# Patient Record
Sex: Female | Born: 1950 | Race: White | Hispanic: No | Smoking: Never smoker
Health system: Southern US, Community
[De-identification: ages and names within clinical notes are randomized; demographics above are authoritative.]

## PROBLEM LIST (undated history)

## (undated) DIAGNOSIS — E079 Disorder of thyroid, unspecified: Secondary | ICD-10-CM

## (undated) DIAGNOSIS — A071 Giardiasis [lambliasis]: Secondary | ICD-10-CM

## (undated) DIAGNOSIS — I10 Essential (primary) hypertension: Secondary | ICD-10-CM

## (undated) DIAGNOSIS — K219 Gastro-esophageal reflux disease without esophagitis: Secondary | ICD-10-CM

## (undated) DIAGNOSIS — I341 Nonrheumatic mitral (valve) prolapse: Secondary | ICD-10-CM

## (undated) DIAGNOSIS — IMO0002 Reserved for concepts with insufficient information to code with codable children: Secondary | ICD-10-CM

## (undated) DIAGNOSIS — K589 Irritable bowel syndrome without diarrhea: Secondary | ICD-10-CM

## (undated) DIAGNOSIS — E669 Obesity, unspecified: Secondary | ICD-10-CM

## (undated) DIAGNOSIS — J849 Interstitial pulmonary disease, unspecified: Secondary | ICD-10-CM

## (undated) DIAGNOSIS — E119 Type 2 diabetes mellitus without complications: Secondary | ICD-10-CM

## (undated) DIAGNOSIS — M329 Systemic lupus erythematosus, unspecified: Secondary | ICD-10-CM

## (undated) DIAGNOSIS — F419 Anxiety disorder, unspecified: Secondary | ICD-10-CM

## (undated) DIAGNOSIS — E785 Hyperlipidemia, unspecified: Secondary | ICD-10-CM

## (undated) DIAGNOSIS — M1990 Unspecified osteoarthritis, unspecified site: Secondary | ICD-10-CM

## (undated) HISTORY — DX: Unspecified osteoarthritis, unspecified site: M19.90

## (undated) HISTORY — PX: ABDOMINAL HYSTERECTOMY: SHX81

## (undated) HISTORY — PX: CHOLECYSTECTOMY: SHX55

## (undated) HISTORY — DX: Anxiety disorder, unspecified: F41.9

## (undated) HISTORY — DX: Hyperlipidemia, unspecified: E78.5

## (undated) HISTORY — DX: Type 2 diabetes mellitus without complications: E11.9

## (undated) HISTORY — DX: Essential (primary) hypertension: I10

## (undated) HISTORY — DX: Nonrheumatic mitral (valve) prolapse: I34.1

## (undated) HISTORY — DX: Gastro-esophageal reflux disease without esophagitis: K21.9

## (undated) HISTORY — PX: TONSILLECTOMY AND ADENOIDECTOMY: SUR1326

## (undated) HISTORY — PX: CYSTECTOMY: SUR359

## (undated) HISTORY — DX: Irritable bowel syndrome, unspecified: K58.9

## (undated) HISTORY — PX: REPLACEMENT TOTAL KNEE BILATERAL: SUR1225

## (undated) HISTORY — DX: Obesity, unspecified: E66.9

## (undated) HISTORY — PX: REVISION TOTAL KNEE ARTHROPLASTY: SHX767

## (undated) HISTORY — DX: Giardiasis (lambliasis): A07.1

## (undated) HISTORY — DX: Disorder of thyroid, unspecified: E07.9

---

## 2001-05-29 ENCOUNTER — Encounter: Admission: RE | Admit: 2001-05-29 | Discharge: 2001-05-29 | Payer: Self-pay | Admitting: Neurosurgery

## 2003-09-05 ENCOUNTER — Ambulatory Visit (HOSPITAL_COMMUNITY): Admission: RE | Admit: 2003-09-05 | Discharge: 2003-09-05 | Payer: Self-pay | Admitting: Internal Medicine

## 2004-05-17 ENCOUNTER — Encounter: Admission: RE | Admit: 2004-05-17 | Discharge: 2004-05-17 | Payer: Self-pay | Admitting: Internal Medicine

## 2005-08-10 ENCOUNTER — Emergency Department (HOSPITAL_COMMUNITY): Admission: EM | Admit: 2005-08-10 | Discharge: 2005-08-10 | Payer: Self-pay | Admitting: Emergency Medicine

## 2007-12-02 ENCOUNTER — Encounter: Admission: RE | Admit: 2007-12-02 | Discharge: 2007-12-02 | Payer: Self-pay | Admitting: Internal Medicine

## 2009-05-07 ENCOUNTER — Emergency Department: Payer: Self-pay | Admitting: Emergency Medicine

## 2012-10-20 ENCOUNTER — Ambulatory Visit (INDEPENDENT_AMBULATORY_CARE_PROVIDER_SITE_OTHER): Payer: BC Managed Care – PPO | Admitting: Internal Medicine

## 2012-10-20 ENCOUNTER — Encounter: Payer: Self-pay | Admitting: Internal Medicine

## 2012-10-20 VITALS — BP 120/70 | HR 69 | Temp 97.7°F | Resp 18 | Ht 63.0 in | Wt 245.0 lb

## 2012-10-20 DIAGNOSIS — M1990 Unspecified osteoarthritis, unspecified site: Secondary | ICD-10-CM

## 2012-10-20 DIAGNOSIS — Z8679 Personal history of other diseases of the circulatory system: Secondary | ICD-10-CM | POA: Insufficient documentation

## 2012-10-20 DIAGNOSIS — Z90710 Acquired absence of both cervix and uterus: Secondary | ICD-10-CM

## 2012-10-20 DIAGNOSIS — E119 Type 2 diabetes mellitus without complications: Secondary | ICD-10-CM | POA: Insufficient documentation

## 2012-10-20 DIAGNOSIS — R197 Diarrhea, unspecified: Secondary | ICD-10-CM

## 2012-10-20 DIAGNOSIS — I10 Essential (primary) hypertension: Secondary | ICD-10-CM

## 2012-10-20 DIAGNOSIS — E785 Hyperlipidemia, unspecified: Secondary | ICD-10-CM

## 2012-10-20 DIAGNOSIS — K529 Noninfective gastroenteritis and colitis, unspecified: Secondary | ICD-10-CM | POA: Insufficient documentation

## 2012-10-20 DIAGNOSIS — IMO0001 Reserved for inherently not codable concepts without codable children: Secondary | ICD-10-CM

## 2012-10-20 DIAGNOSIS — Z8619 Personal history of other infectious and parasitic diseases: Secondary | ICD-10-CM

## 2012-10-20 DIAGNOSIS — K589 Irritable bowel syndrome without diarrhea: Secondary | ICD-10-CM | POA: Insufficient documentation

## 2012-10-20 DIAGNOSIS — Z139 Encounter for screening, unspecified: Secondary | ICD-10-CM

## 2012-10-20 DIAGNOSIS — Z8659 Personal history of other mental and behavioral disorders: Secondary | ICD-10-CM

## 2012-10-20 DIAGNOSIS — E039 Hypothyroidism, unspecified: Secondary | ICD-10-CM

## 2012-10-20 LAB — COMPREHENSIVE METABOLIC PANEL
ALT: 15 U/L (ref 0–35)
AST: 11 U/L (ref 0–37)
Albumin: 4.2 g/dL (ref 3.5–5.2)
Alkaline Phosphatase: 80 U/L (ref 39–117)
BUN: 15 mg/dL (ref 6–23)
CO2: 28 mEq/L (ref 19–32)
Calcium: 9.5 mg/dL (ref 8.4–10.5)
Chloride: 103 mEq/L (ref 96–112)
Creat: 0.95 mg/dL (ref 0.50–1.10)
Glucose, Bld: 91 mg/dL (ref 70–99)
Potassium: 4.3 mEq/L (ref 3.5–5.3)
Sodium: 142 mEq/L (ref 135–145)
Total Bilirubin: 0.4 mg/dL (ref 0.3–1.2)
Total Protein: 6.2 g/dL (ref 6.0–8.3)

## 2012-10-20 LAB — LIPID PANEL
Cholesterol: 287 mg/dL — ABNORMAL HIGH (ref 0–200)
HDL: 49 mg/dL (ref >39)
LDL Cholesterol: 202 mg/dL — ABNORMAL HIGH (ref 0–99)
Total CHOL/HDL Ratio: 5.9 Ratio
Triglycerides: 178 mg/dL — ABNORMAL HIGH (ref <150)
VLDL: 36 mg/dL (ref 0–40)

## 2012-10-20 MED ORDER — FUROSEMIDE 20 MG PO TABS: 20.0000 mg | ORAL_TABLET | Freq: Every day | ORAL | Status: DC

## 2012-10-20 MED ORDER — QUINAPRIL-HYDROCHLOROTHIAZIDE 20-25 MG PO TABS: 1.0000 | ORAL_TABLET | Freq: Every day | ORAL | Status: DC

## 2012-10-20 NOTE — Progress Notes (Signed)
Subjective:    Patient ID: Dana Doyle, female    DOB: June 06, 1950, 62 y.o.   MRN: [id number]  HPI Dana Doyle is a new pt here for first visit.  [facility].  PMH of long standing HTN,  Diabetes (1 year),  Anxiety,  DJD of knees,  Hyperlipidemia, MVP Hypothyroidism, and remote abnormal pap in 1980's with subsequent hysterectomy.   She also has irritiaable bowel syndrom and chronic diarrhea.    She had an episode of chest pain last week  That she states was different from the pain she has with her MVP. 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  She is adopted and does not know her FH.  No further episodes in the last 7 days  She also has chornic diarrhea and is concerned that her stools are yellow in color.  She as not seen her GI MD in quite a while as she lost her job in 2009 and recently obtained insurance via the [employer]   Allergies  Allergen Reactions  . Phenergan (Promethazine Hcl) Anaphylaxis   Past Medical History  Diagnosis Date  . Thyroid disease   . Giardia   . Anxiety   . Arthritis   . Diabetes mellitus without complication   . Hypertension   . Hyperlipidemia    Past Surgical History  Procedure Laterality Date  . Abdominal hysterectomy    . Cholecystectomy    . Tonsillectomy and adenoidectomy     History   Social History  . Marital Status: Married    Spouse Name: N/A    Number of Children: N/A  . Years of Education: N/A   Occupational History  . Not on file.   Social History Main Topics  . Smoking status: Never Smoker   . Smokeless tobacco: Never Used  . Alcohol Use: No  . Drug Use: No  . Sexually Active: No   Other Topics Concern  . Not on file   Social History Narrative  . No narrative on file   Family History  Problem Relation Age of Onset  . Family history unknown: Yes   Patient Active Problem List   Diagnosis Date Noted  . DJD (degenerative joint disease) 10/20/2012  . HTN (hypertension)  10/20/2012  . S/P hysterectomy 10/20/2012  . History of anxiety 10/20/2012  . Type II or unspecified type diabetes mellitus without mention of complication, uncontrolled 10/20/2012  . History of mitral valve prolapse 10/20/2012  . Other and unspecified hyperlipidemia 10/20/2012  . Unspecified hypothyroidism 10/20/2012  . Irritable bowel syndrome 10/20/2012  . Chronic diarrhea 10/20/2012  . History of giardia infection 10/20/2012   No current outpatient prescriptions on file prior to visit.   No current facility-administered medications on file prior to visit.       Review of Systems    see HPI Objective:   Physical Exam Physical Exam  Nursing note and vitals reviewed.  Constitutional: She is oriented to person, place, and time. She appears well-developed and well-nourished.  HENT:  Head: Normocephalic and atraumatic.  Cardiovascular: Normal rate and regular rhythm. Exam reveals no gallop and no friction rub.  No murmur heard.  Pulmonary/Chest: Breath sounds normal. She has no wheezes. She has no rales.  Neurological: She is alert and oriented to person, place, and time.  Skin: Skin is warm and dry.  Ext no edema Psychiatric: She has a normal mood and affect. Her behavior is normal.  Assessment & Plan:  Chest pain  EKG  Non specific ST-T changes.  Will refer to cardiolgy.  Advised if another episode to seek ER care.  She is to take a baby ASA daily until cardiology eval.    History of MVP  See above  Will need current ECHO  Diabetes  Check AIC continue metformin  Hyperlipidemia will check today  HTN continue meds  Hypothyroidism  Check labs today  Chronic diarrhea/IBS  Will refer to GI  See me in one week

## 2012-10-20 NOTE — Patient Instructions (Addendum)
See me next week 

## 2012-10-21 ENCOUNTER — Encounter: Payer: Self-pay | Admitting: Gastroenterology

## 2012-10-21 ENCOUNTER — Encounter: Payer: Self-pay | Admitting: *Deleted

## 2012-10-21 LAB — CBC WITH DIFFERENTIAL/PLATELET
Basophils Absolute: 0 10*3/uL (ref 0.0–0.1)
Basophils Relative: 0 % (ref 0–1)
Eosinophils Absolute: 0.1 10*3/uL (ref 0.0–0.7)
Eosinophils Relative: 1 % (ref 0–5)
HCT: 37.8 % (ref 36.0–46.0)
Hemoglobin: 12.9 g/dL (ref 12.0–15.0)
Lymphocytes Relative: 27 % (ref 12–46)
Lymphs Abs: 1.8 10*3/uL (ref 0.7–4.0)
MCH: 29.3 pg (ref 26.0–34.0)
MCHC: 34.1 g/dL (ref 30.0–36.0)
MCV: 85.7 fL (ref 78.0–100.0)
Monocytes Absolute: 0.5 10*3/uL (ref 0.1–1.0)
Monocytes Relative: 7 % (ref 3–12)
Neutro Abs: 4.3 10*3/uL (ref 1.7–7.7)
Neutrophils Relative %: 65 % (ref 43–77)
Platelets: 343 10*3/uL (ref 150–400)
RBC: 4.41 MIL/uL (ref 3.87–5.11)
RDW: 14.8 % (ref 11.5–15.5)
WBC: 6.7 10*3/uL (ref 4.0–10.5)

## 2012-10-21 LAB — HEMOGLOBIN A1C
Hgb A1c MFr Bld: 6 % — ABNORMAL HIGH (ref <5.7)
Mean Plasma Glucose: 126 mg/dL — ABNORMAL HIGH (ref <117)

## 2012-10-21 LAB — TSH: TSH: 1.606 u[IU]/mL (ref 0.350–4.500)

## 2012-10-21 LAB — VITAMIN D 25 HYDROXY (VIT D DEFICIENCY, FRACTURES): Vit D, 25-Hydroxy: 85 ng/mL (ref 30–89)

## 2012-10-22 ENCOUNTER — Telehealth: Payer: Self-pay

## 2012-10-22 NOTE — Telephone Encounter (Signed)
Received call from patient she stated her husband is a patient of Dr.Jordan's and she wants appointment with Dr.Jordan.Stated she has been having chest pain off and on for the past several weeks.Stated she has MVP and saw Dr.Brackbill 20 years ago.Stated has appointment with Dr.Brackbill 10/23/12 and wants to cancel appointment.Patient was told Dr.Jordan's schedule is full and I will have to check with Dr.Jordan before I add her to his schedule.Patient advised to go to ER if has chest pain.

## 2012-10-23 ENCOUNTER — Ambulatory Visit: Payer: BC Managed Care – PPO | Admitting: Cardiology

## 2012-10-26 ENCOUNTER — Telehealth: Payer: Self-pay | Admitting: *Deleted

## 2012-10-26 NOTE — Telephone Encounter (Signed)
Patient would like lisenapril instead of quinapril due to cost.

## 2012-10-27 ENCOUNTER — Other Ambulatory Visit: Payer: Self-pay | Admitting: Internal Medicine

## 2012-10-27 MED ORDER — LISINOPRIL-HYDROCHLOROTHIAZIDE 20-25 MG PO TABS: 1.0000 | ORAL_TABLET | Freq: Every day | ORAL | Status: DC

## 2012-10-28 ENCOUNTER — Ambulatory Visit (INDEPENDENT_AMBULATORY_CARE_PROVIDER_SITE_OTHER): Payer: BC Managed Care – PPO | Admitting: Internal Medicine

## 2012-10-28 ENCOUNTER — Telehealth: Payer: Self-pay | Admitting: Cardiology

## 2012-10-28 ENCOUNTER — Encounter: Payer: Self-pay | Admitting: Internal Medicine

## 2012-10-28 ENCOUNTER — Ambulatory Visit (HOSPITAL_BASED_OUTPATIENT_CLINIC_OR_DEPARTMENT_OTHER)
Admission: RE | Admit: 2012-10-28 | Discharge: 2012-10-28 | Disposition: A | Discharge status: Home / Self Care | Payer: BC Managed Care – PPO | Source: Ambulatory Visit | Attending: Internal Medicine | Admitting: Internal Medicine

## 2012-10-28 VITALS — BP 109/73 | HR 78 | Temp 98.5°F | Resp 18 | Wt 243.0 lb

## 2012-10-28 DIAGNOSIS — I10 Essential (primary) hypertension: Secondary | ICD-10-CM

## 2012-10-28 DIAGNOSIS — IMO0001 Reserved for inherently not codable concepts without codable children: Secondary | ICD-10-CM

## 2012-10-28 DIAGNOSIS — R197 Diarrhea, unspecified: Secondary | ICD-10-CM

## 2012-10-28 DIAGNOSIS — K529 Noninfective gastroenteritis and colitis, unspecified: Secondary | ICD-10-CM

## 2012-10-28 DIAGNOSIS — Z1231 Encounter for screening mammogram for malignant neoplasm of breast: Secondary | ICD-10-CM | POA: Insufficient documentation

## 2012-10-28 DIAGNOSIS — E785 Hyperlipidemia, unspecified: Secondary | ICD-10-CM

## 2012-10-28 DIAGNOSIS — Z139 Encounter for screening, unspecified: Secondary | ICD-10-CM

## 2012-10-28 MED ORDER — DIAZEPAM 2 MG PO TABS: ORAL_TABLET | ORAL | Status: DC

## 2012-10-28 NOTE — Telephone Encounter (Signed)
Returned call to patient Dr.Jordan had a cancellation on Friday 10/30/12.Appointment scheduled with Dr.Jordan 10/30/12 1:30 pm advised to check in at 1:15 pm.

## 2012-10-28 NOTE — Telephone Encounter (Signed)
Follow Up ° °Pt returning call from earlier. Please call. °

## 2012-10-28 NOTE — Patient Instructions (Addendum)
See me in 3 months

## 2012-10-28 NOTE — Progress Notes (Signed)
Subjective:    Patient ID: Dana Doyle, female    DOB: 1950-04-30, 62 y.o.   MRN: [id number]  HPI Dana Doyle is here for follow up  See labs  She has a long standing history of significant Hyperlipidemia  And tells me she is intolerant of all statins,  Zetia , Tricor and welchol.    Diabetes  AIC great.    She tells me she has an upcoming appt with Dr. [place] and Dr. Jarold Motto  I have changed her BP med to lisinopril/hctz as she could not affort Accuretic  Allergies  Allergen Reactions  . Phenergan (Promethazine Hcl) Anaphylaxis   Past Medical History  Diagnosis Date  . Thyroid disease   . Giardia   . Anxiety   . Arthritis   . Diabetes mellitus without complication   . Hypertension   . Hyperlipidemia   . IBS (irritable bowel syndrome)   . GERD (gastroesophageal reflux disease)   . MVP (mitral valve prolapse)    Past Surgical History  Procedure Laterality Date  . Abdominal hysterectomy    . Cholecystectomy    . Tonsillectomy and adenoidectomy     History   Social History  . Marital Status: Married    Spouse Name: N/A    Number of Children: N/A  . Years of Education: N/A   Occupational History  . Not on file.   Social History Main Topics  . Smoking status: Never Smoker   . Smokeless tobacco: Never Used  . Alcohol Use: No  . Drug Use: No  . Sexually Active: No   Other Topics Concern  . Not on file   Social History Narrative  . No narrative on file   History reviewed. No pertinent family history. Patient Active Problem List   Diagnosis Date Noted  . DJD (degenerative joint disease) 10/20/2012  . HTN (hypertension) 10/20/2012  . S/P hysterectomy 10/20/2012  . History of anxiety 10/20/2012  . Type II or unspecified type diabetes mellitus without mention of complication, uncontrolled 10/20/2012  . History of mitral valve prolapse 10/20/2012  . Other and unspecified hyperlipidemia 10/20/2012  . Unspecified hypothyroidism 10/20/2012  . Irritable bowel  syndrome 10/20/2012  . Chronic diarrhea 10/20/2012  . History of giardia infection 10/20/2012   Current Outpatient Prescriptions on File Prior to Visit  Medication Sig Dispense Refill  . b complex vitamins capsule Take 1 capsule by mouth daily.      . Diclofenac-Misoprostol (ARTHROTEC PO) Take 75 mg by mouth 2 (two) times daily before a meal.      . dicyclomine (BENTYL) 20 MG tablet Take 20 mg by mouth as needed.       . ergocalciferol (VITAMIN D2) 50000 UNITS capsule Take 5,000 Units by mouth daily.       . furosemide (LASIX) 20 MG tablet Take 1 tablet (20 mg total) by mouth daily.  90 tablet  0  . levothyroxine (SYNTHROID, LEVOTHROID) 50 MCG tablet Take 60 mcg by mouth daily before breakfast.      . lisinopril-hydrochlorothiazide (PRINZIDE,ZESTORETIC) 20-25 MG per tablet Take 1 tablet by mouth daily.  90 tablet  3  . metFORMIN (GLUCOPHAGE) 500 MG tablet Take 500 mg by mouth 2 (two) times daily with a meal. One half tablet daily for a total of 500mg  daily      . naproxen sodium (ANAPROX) 220 MG tablet Take 220 mg by mouth as needed.      Marland Kitchen OVER THE COUNTER MEDICATION 595 mg by Per post-pyloric tube route  daily. potassium       No current facility-administered medications on file prior to visit.       Review of Systems See HPI    Objective:   Physical Exam  Physical Exam  Nursing note and vitals reviewed.  Constitutional: She is oriented to person, place, and time. She appears well-developed and well-nourished.  HENT:  Head: Normocephalic and atraumatic.  Cardiovascular: Normal rate and regular rhythm. Exam reveals no gallop and no friction rub.  No murmur heard.  Pulmonary/Chest: Breath sounds normal. She has no wheezes. She has no rales.  Neurological: She is alert and oriented to person, place, and time.  Skin: Skin is warm and dry.  Ext  No edema Psychiatric: She has a normal mood and affect. Her behavior is normal.        Assessment & Plan:  HTN well  controlled  Hyperlipidemia pt wishes to discuss with cardiologist.    Diabetes  AIC great    Obesity  Counseled DASH diet   Hypothyrodisim  TSH normal  See me in 3-4 months

## 2012-10-30 ENCOUNTER — Ambulatory Visit (INDEPENDENT_AMBULATORY_CARE_PROVIDER_SITE_OTHER): Payer: BC Managed Care – PPO | Admitting: Cardiology

## 2012-10-30 ENCOUNTER — Encounter: Payer: Self-pay | Admitting: Cardiology

## 2012-10-30 VITALS — BP 104/60 | HR 70 | Ht 63.0 in | Wt 244.6 lb

## 2012-10-30 DIAGNOSIS — E7800 Pure hypercholesterolemia, unspecified: Secondary | ICD-10-CM

## 2012-10-30 DIAGNOSIS — I10 Essential (primary) hypertension: Secondary | ICD-10-CM

## 2012-10-30 DIAGNOSIS — R079 Chest pain, unspecified: Secondary | ICD-10-CM

## 2012-10-30 DIAGNOSIS — IMO0001 Reserved for inherently not codable concepts without codable children: Secondary | ICD-10-CM

## 2012-10-30 DIAGNOSIS — Z8679 Personal history of other diseases of the circulatory system: Secondary | ICD-10-CM

## 2012-10-30 NOTE — Patient Instructions (Signed)
We will schedule you for a nuclear stress test and an Echocardiogram  Continue your current therapy for now.

## 2012-10-30 NOTE — Progress Notes (Signed)
Dana Doyle Date of Birth: 1950-09-01 Medical Record #[id number]  History of Present Illness: Dana Doyle is seen at the request of Dr. Constance Goltz for evaluation of chest pain. She is a pleasant 62 year old white female who has a history of marked hypercholesterolemia, hypertension, and diabetes. She reports that she was diagnosed with angina many years ago. She was also diagnosed with mitral valve prolapse at that time. Her last evaluation was about 10 years ago when she had an echocardiogram and stress test. She reports she had a heart catheterization 20 years ago which demonstrated no significant coronary disease. Over the past 6 months she has experienced increased symptoms of chest pain. She describes this as left anterior precordial pain radiating to her back. It may be associated with shortness of breath. She finds it difficult to lie on her left side. She describes it both as a pressure and pain. It may last as little as 10 minutes or as long as 2 hours. Typically she will just rest and take aspirin until he goes away. She feels her symptoms are more frequent now on her current once or twice a week. Her symptoms are worse with activity or with stress. She is limited in her exercise because of arthritis in her knees.  Current Outpatient Prescriptions on File Prior to Visit  Medication Sig Dispense Refill  . b complex vitamins capsule Take 1 capsule by mouth daily.      . diazepam (VALIUM) 2 MG tablet Take one at [facility] prn  12 tablet  0  . Diclofenac-Misoprostol (ARTHROTEC PO) Take 75 mg by mouth 2 (two) times daily before a meal.      . dicyclomine (BENTYL) 20 MG tablet Take 20 mg by mouth as needed.       . ergocalciferol (VITAMIN D2) 50000 UNITS capsule Take 5,000 Units by mouth daily.       . furosemide (LASIX) 20 MG tablet Take 1 tablet (20 mg total) by mouth daily.  90 tablet  0  . levothyroxine (SYNTHROID, LEVOTHROID) 50 MCG tablet Take 60 mcg by mouth daily before breakfast.      .  lisinopril-hydrochlorothiazide (PRINZIDE,ZESTORETIC) 20-25 MG per tablet Take 1 tablet by mouth daily.  90 tablet  3  . metFORMIN (GLUCOPHAGE) 500 MG tablet Take 500 mg by mouth 2 (two) times daily with a meal. One half tablet daily for a total of 500mg  daily      . naproxen sodium (ANAPROX) 220 MG tablet Take 220 mg by mouth as needed.      Marland Kitchen OVER THE COUNTER MEDICATION 595 mg by Per post-pyloric tube route daily. potassium       No current facility-administered medications on file prior to visit.    Allergies  Allergen Reactions  . Phenergan (Promethazine Hcl) Anaphylaxis    Past Medical History  Diagnosis Date  . Thyroid disease   . Giardia   . Anxiety   . Arthritis   . Diabetes mellitus without complication   . Hypertension   . Hyperlipidemia   . IBS (irritable bowel syndrome)   . GERD (gastroesophageal reflux disease)   . MVP (mitral valve prolapse)     Past Surgical History  Procedure Laterality Date  . Abdominal hysterectomy    . Cholecystectomy    . Tonsillectomy and adenoidectomy      History  Smoking status  . Never Smoker   Smokeless tobacco  . Never Used    History  Alcohol Use No    History  reviewed. No pertinent family history.  Review of Systems: The review of systems is positive for obesity.  All other systems were reviewed and are negative.  Physical Exam: BP 104/60  Pulse 70  Ht 5\' 3"  (1.6 m)  Wt 244 lb 9.6 oz (110.95 kg)  BMI 43.34 kg/m2 She is an obese white female in no acute distress. HEENT: Normocephalic, atraumatic. It was equal round and reactive to light accommodation. Extraocular movements are full. Oropharynx is clear. Neck is supple no JVD, adenopathy, thyromegaly, or bruits. Lungs: Clear Cardiovascular: Regular rate and rhythm. No gallop, murmur, or click. PMI is normal. Abdomen: Morbidly obese, soft, nontender. No basilar megaly or masses. Bowel sounds are positive. Extremities: No cyanosis or edema. Pulses are 2+ and  symmetric. Neuro: Alert and oriented x3. Cranial nerves II through XII are intact. Skin: Warm and dry.  LABORATORY DATA: Lab Results  Component Value Date   WBC 6.7 10/20/2012   HGB 12.9 10/20/2012   HCT 37.8 10/20/2012   PLT 343 10/20/2012   GLUCOSE 91 10/20/2012   CHOL 287* 10/20/2012   TRIG 178* 10/20/2012   HDL 49 10/20/2012   LDLCALC 202* 10/20/2012   ALT 15 10/20/2012   AST 11 10/20/2012   NA 142 10/20/2012   K 4.3 10/20/2012   CL 103 10/20/2012   CREATININE 0.95 10/20/2012   BUN 15 10/20/2012   CO2 28 10/20/2012   TSH 1.606 10/20/2012   HGBA1C 6.0* 10/20/2012   ECG demonstrates normal sinus rhythm with a rate of 71 beats per minute. It is otherwise normal.  Assessment / Plan: 1. Chest pain. Patient does have multiple cardiac risk factors including diabetes, hypertension, and hyperlipidemia. We will schedule her for a nuclear stress test. Her symptoms could also be related to mitral valve prolapse. Her exam today is fairly benign. Have recommended an echocardiogram. Depending on the results of the studies we may consider treating her with a beta blocker but we would probably need to reduce her other antihypertensive therapy. 2. Severe hypercholesterolemia. Patient has been intolerant even low-dose statin therapy. She also has history of intolerance to Zetia, [employer], and [employer]. She needs to focus on weight loss and dietary modification. 3. Diabetes mellitus type 2. 4. Hypertension-controlled. 5. Morbid obesity.

## 2012-11-03 ENCOUNTER — Ambulatory Visit: Payer: Self-pay | Admitting: Internal Medicine

## 2012-11-03 ENCOUNTER — Encounter: Payer: Self-pay | Admitting: Gastroenterology

## 2012-11-03 ENCOUNTER — Ambulatory Visit (INDEPENDENT_AMBULATORY_CARE_PROVIDER_SITE_OTHER): Payer: BC Managed Care – PPO | Admitting: Gastroenterology

## 2012-11-03 VITALS — BP 124/72 | HR 83 | Ht 63.0 in | Wt 244.0 lb

## 2012-11-03 DIAGNOSIS — R197 Diarrhea, unspecified: Secondary | ICD-10-CM

## 2012-11-03 DIAGNOSIS — R1011 Right upper quadrant pain: Secondary | ICD-10-CM

## 2012-11-03 DIAGNOSIS — K589 Irritable bowel syndrome without diarrhea: Secondary | ICD-10-CM

## 2012-11-03 DIAGNOSIS — Z9049 Acquired absence of other specified parts of digestive tract: Secondary | ICD-10-CM

## 2012-11-03 DIAGNOSIS — Z9089 Acquired absence of other organs: Secondary | ICD-10-CM

## 2012-11-03 MED ORDER — MOVIPREP 100 G PO SOLR: 1.0000 | Freq: Once | ORAL | Status: DC

## 2012-11-03 MED ORDER — COLESEVELAM HCL 625 MG PO TABS: 625.0000 mg | ORAL_TABLET | Freq: Every day | ORAL | Status: DC

## 2012-11-03 NOTE — Progress Notes (Signed)
History of Present Illness:  This is a 62 year old Caucasian female who has had multiple GI evaluations over the last 20 years.  She has IBS with diarrhea predominance, and is really done well over the last several years ,but now has had exacerbation of her chronic diarrhea with loose watery" yellow stools".  She complains of some right flank discomfort but no upper GI or hepatobiliary symptoms.  She also denies melena, hematochezia, anorexia or weight loss.  There is no specific trigger to her diarrhea except stress.  She does complain of some urgency and occasional, almost daily nocturnal diarrhea.  Patient has degenerative arthritis in her knees and is on several when necessary NSAIDs.  Also has a long history of thyroid disorder, adult onset diabetes treated with low-dose metformin 250 mg twice a day, and takes when necessary Valium for stress.  Patient also has apparently been seen by another GI physician and placed on when necessary Benadryl 20 mg which does help her abdominal cramping.  Previous evaluations have shown no evidence of celiac disease.  She is status post cholecystectomy by Dr. Daphine Deutscher in 1990.  Last colonoscopy was over 10 years ago.  Past medical history is remarkable for mitral valve prolapse, previous acid reflux, and a vague history of treated Giardia in the past although repeat stool exams have been negative.  Review of her labs shows a negative small bowel,  duodenal, and colon biopsies in 2004.  Patient denies lactose intolerance or use of sorbitol or fructose.  She is status post previous hysterectomy and has had chronic obesity for many years.  I have reviewed this patient's present history, medical and surgical past history, allergies and medications.     ROS:   All systems were reviewed and are negative unless otherwise stated in the HPI.    Physical Exam: Blood pressure 124/72, pulse 83 and regular and weight 244 with a BMI of 43.23.  Morbidly obese but no acute  distress General well developed well nourished patient in no acute distress, appearing their stated age Eyes PERRLA, no icterus, fundoscopic exam per opthamologist Skin no lesions noted Neck supple, no adenopathy, no thyroid enlargement, no tenderness Chest clear to percussion and auscultation Heart no significant murmurs, gallops or rubs noted Abdomen no hepatosplenomegaly masses or tenderness, BS normal.  Rectal inspection normal no fissures, or fistulae noted.  No masses or tenderness on digital exam. Stool guaiac negative. Extremities no acute joint lesions, edema, phlebitis or evidence of cellulitis. Neurologic patient oriented x 3, cranial nerves intact, no focal neurologic deficits noted. Psychological mental status normal and normal affect.  Assessment and plan:   IBS diarrhea exacerbated by cholecystectomy.  I placed her on WelChol 650 mg a day with a FOD-MAP diet for IBS diarrhea.  I do think she needs followup colonoscopy which is been scheduled.  Have asked her to try to use NSAIDs as sparingly as possible.  She is otherwise continue medications as listed and reviewed.

## 2012-11-03 NOTE — Patient Instructions (Addendum)
  You have been scheduled for a colonoscopy with propofol. Please follow written instructions given to you at your visit today.  Please pick up your prep kit at the pharmacy within the next 1-3 days. If you use inhalers (even only as needed), please bring them with you on the day of your procedure. Your physician has requested that you go to www.startemmi.com and enter the access code given to you at your visit today. This web site gives a general overview about your procedure. However, you should still follow specific instructions given to you by our office regarding your preparation for the procedure.  We have sent the following medications to your pharmacy for you to pick up at your convenience: Welchol 625 mg, please take one tablet by mouth once daily  FOD MAP diet given today for your review ________________________________________________                                               We are excited to introduce MyChart, a new best-in-class service that provides you online access to important information in your electronic medical record. We want to make it easier for you to view your health information - all in one secure location - when and where you need it. We expect MyChart will enhance the quality of care and service we provide.  When you register for MyChart, you can:    View your test results.    Request appointments and receive appointment reminders via email.    Request medication renewals.    View your medical history, allergies, medications and immunizations.    Communicate with your physician's office through a password-protected site.    Conveniently print information such as your medication lists.  To find out if MyChart is right for you, please talk to a member of our clinical staff today. We will gladly answer your questions about this free health and wellness tool.  If you are age 57 or older and want a member of your family to have access to your record, you  must provide written consent by completing a proxy form available at our office. Please speak to our clinical staff about guidelines regarding accounts for patients younger than age 76.  As you activate your MyChart account and need any technical assistance, please call the MyChart technical support line at [phone]-CHART [phone]) or email your question to [email or web address]. If you email your question(s), please include your name, a return phone number and the best time to reach you.  If you have non-urgent health-related questions, you can send a message to our office through MyChart at [email or web address]. If you have a medical emergency, call 911.  Thank you for using MyChart as your new health and wellness resource!   MyChart licensed from [employer],  4540-9811. Patents Pending.

## 2012-11-04 ENCOUNTER — Encounter: Payer: Self-pay | Admitting: Gastroenterology

## 2012-11-05 ENCOUNTER — Telehealth: Payer: Self-pay | Admitting: *Deleted

## 2012-11-05 MED ORDER — CHOLESTYRAMINE 4 G PO PACK: 1.0000 | PACK | Freq: Every day | ORAL | Status: DC

## 2012-11-05 NOTE — Telephone Encounter (Signed)
Via fax from [facility], patient says that Dana Doyle is to expensive and needs something else  I asked Dr. Jarold Motto what else we could give the patient and per Dr. Jarold Motto patient can try Questran 4 grams in juice once daily.  Questran sent

## 2012-11-09 ENCOUNTER — Telehealth: Payer: Self-pay | Admitting: *Deleted

## 2012-11-09 NOTE — Telephone Encounter (Signed)
Patient cannot afford Questran or Welchol, is there anything else she can use, prescription or over the counter?

## 2012-11-09 NOTE — Telephone Encounter (Signed)
Patient called back and said the pharmacy was confusing her about insurance not covering Moviprep I advised patient that I have a Moviprep kit sample that she can pick up so she will not have to worry about insurance and pharmacy Patient advised that she will be in town tomorrow and will pick up kit then  Patient also said she cannot afford the Questran either I advised patient that I will send Dr. Jarold Motto a message that she cannot afford Questran and Welchol and once he gives me a response I will call her back.  Patient verbalized understanding

## 2012-11-09 NOTE — Telephone Encounter (Signed)
Patient left message with Janelle that Moviprep was not sent to her pharmacy and Welchol is to expensive.  Moviprep was sent because pharmacy sent letter for prior auth to be done for Moviprep last week and I called [facility] back last week to advise we do not do prior auth for Moviprep. I advised pharmacy patient can contact the office for a coupon.  Also I sent Questran per Dr. Bernette Mayers note) because while I was on phone with pharmacy about Moviprep pharmacist stated patient cannot afford Welchol.  I left message on patients voicemail for a call back to the office.

## 2012-11-09 NOTE — Telephone Encounter (Signed)
I called patient back and advised that per Dr. Jarold Motto there is no other medication that she can use for her diarrhea I advised patient to follow the FOD MAP diet given to her and if that does not help the diarrhea try Imodium  Advised patient if Imodium and diet does not work call the office back  Patient verbalized understanding

## 2012-11-09 NOTE — Telephone Encounter (Signed)
no

## 2012-11-10 ENCOUNTER — Ambulatory Visit (HOSPITAL_COMMUNITY): Payer: BC Managed Care – PPO | Attending: Cardiology | Admitting: Radiology

## 2012-11-10 ENCOUNTER — Encounter (HOSPITAL_COMMUNITY): Payer: Self-pay

## 2012-11-10 VITALS — BP 85/48 | HR 70 | Ht 63.0 in | Wt 240.0 lb

## 2012-11-10 DIAGNOSIS — R0602 Shortness of breath: Secondary | ICD-10-CM

## 2012-11-10 DIAGNOSIS — IMO0001 Reserved for inherently not codable concepts without codable children: Secondary | ICD-10-CM

## 2012-11-10 DIAGNOSIS — I059 Rheumatic mitral valve disease, unspecified: Secondary | ICD-10-CM | POA: Insufficient documentation

## 2012-11-10 DIAGNOSIS — R079 Chest pain, unspecified: Secondary | ICD-10-CM | POA: Insufficient documentation

## 2012-11-10 DIAGNOSIS — I10 Essential (primary) hypertension: Secondary | ICD-10-CM | POA: Insufficient documentation

## 2012-11-10 DIAGNOSIS — Z8679 Personal history of other diseases of the circulatory system: Secondary | ICD-10-CM

## 2012-11-10 DIAGNOSIS — E7800 Pure hypercholesterolemia, unspecified: Secondary | ICD-10-CM | POA: Insufficient documentation

## 2012-11-10 DIAGNOSIS — E119 Type 2 diabetes mellitus without complications: Secondary | ICD-10-CM | POA: Insufficient documentation

## 2012-11-10 MED ORDER — REGADENOSON 0.4 MG/5ML IV SOLN
0.4000 mg | Freq: Once | INTRAVENOUS | Status: AC
  Administered 2012-11-10: 0.4 mg via INTRAVENOUS

## 2012-11-10 MED ORDER — TECHNETIUM TC 99M SESTAMIBI GENERIC - CARDIOLITE
33.0000 | Freq: Once | INTRAVENOUS | Status: AC | PRN
  Administered 2012-11-10: 33 via INTRAVENOUS

## 2012-11-10 NOTE — Progress Notes (Signed)
[facility] SITE 3 NUCLEAR MED [address] [place], [place] [address] [phone]    Cardiology Nuclear Med Study  Dana Doyle is a 62 y.o. female     MRN : [id number]     DOB: 08-20-1950  Procedure Date: 11/10/2012  Nuclear Med Background Indication for Stress Test:  Evaluation for Ischemia History:  Asthma, Abnormal EKG, MVP, 10 years ago GXT: ok per patient, 20 years ago Cath: no significant CAD Cardiac Risk Factors: Hypertension, Lipids and NIDDM  Symptoms: Chest pain,Tightness, Pressure with/without exertion (last occurrence continuous x 2 months per patient, varies in intensity), Diaphoresis, DOE, Fatigue, Fatigue with Exertion, Nausea, Palpitations, Rapid HR and SOB   Nuclear Pre-Procedure Caffeine/Decaff Intake:  9:00pm NPO After: 8:30am   Lungs:  clear O2 Sat: 96% on room air. IV 0.9% NS with Angio Cath:  22g  IV Site: L Hand  IV Started by:  Cathlyn Parsons, RN  Chest Size (in):  40 Cup Size: D  Height: 5\' 3"  (1.6 m)  Weight:  240 lb (108.863 kg)  BMI:  Body mass index is 42.52 kg/(m^2). Tech Comments: Took Prinizide, and metformin this am.The patient has a baseline BP 80/60,patient denies dizziness. Patient complains of chronic chest tightness 1-2/10.   IV 0.9% NACl hung open prior to lexiscan  Discussed with [name], NP with an ok to do lexiscan, patient to keep record of BP's and call readings to Dr. Swaziland on 11/12/12.Irean Hong, RN.    Nuclear Med Study 1 or 2 day study: 2 day  Stress Test Type:  Lexiscan  Reading MD: Cassell Clement, MD  Order Authorizing Provider:  Vonna Drafts  Resting Radionuclide: Technetium 46m Sestamibi  Resting Radionuclide Dose: 33.0 mCi  11/12/12  Stress Radionuclide:  Technetium 63m Sestamibi  Stress Radionuclide Dose: 33.0 mCi   11/10/12          Stress Protocol Rest HR: 70 Stress HR: 100  Rest BP: 85/48 Stress BP: 102/62  Exercise Time (min): n/a METS: n/a   Predicted Max HR: 160 bpm % Max HR:  62.5 bpm Rate Pressure Product: 21308   Dose of Adenosine (mg):  n/a Dose of Lexiscan: 0.4 mg  Dose of Atropine (mg): n/a Dose of Dobutamine: n/a mcg/kg/min (at max HR)  Stress Test Technologist: Irean Hong, RN  Nuclear Technologist:  Domenic Polite, CNMT     Rest Procedure:  Myocardial perfusion imaging was performed at rest 45 minutes following the intravenous administration of Technetium 87m Sestamibi. Rest ECG: NSR - Normal EKG  Stress Procedure:  The patient received IV Lexiscan 0.4 mg over 15-seconds.  Technetium 18m Sestamibi injected at 30-seconds  The patient had a baseline BP 80/60. The patient complained of increase chest tightness to 7/10, and dizziness with Lexiscan. The patient received a total of 750 ml of 0.9% NACL.  Quantitative spect images were obtained after a 45 minute delay. Stress ECG: No significant change from baseline ECG  QPS Raw Data Images:  There is a breast shadow that accounts for the anterior attenuation. Stress Images:  There is a small area of mild anterior apical attenuation with normal uptake in the other regions.   Rest Images:  There is a small area of mild anterior apical attenuation with normal uptake in the other regions.  Subtraction (SDS):  No evidence of ischemia. Transient Ischemic Dilatation (Normal <1.22):  n/a Lung/Heart Ratio (Normal <0.45):  0.47  Quantitative Gated Spect Images QGS EDV:  54 ml QGS ESV:  6 ml  Impression Exercise Capacity:  Lexiscan with no exercise. BP Response:  Normal blood pressure response. Clinical Symptoms:  No significant symptoms noted. ECG Impression:  No significant ST segment change suggestive of ischemia. Comparison with Prior Nuclear Study: No previous nuclear study performed  Overall Impression:  Low risk stress nuclear study There is mild anterior apical attenuation that appears to be due to breast artifact.  .  LV Ejection Fraction: 90%.  LV Wall Motion:  NL LV Function; NL Wall  Motion.   Vesta Mixer, Montez Hageman., MD, [facility] 11/12/2012, 5:43 PM Office - [phone] Pager [phone]

## 2012-11-10 NOTE — Progress Notes (Signed)
Patient ID: Dana Doyle, female   DOB: 1951-01-01, 62 y.o.   MRN: [id number] Dr. Swaziland, The patient was here for a Lexiscan cardiolite today. Her baseline BP with automatic Cuff was 73/51, manual Cuff 80/60. The patient took Prinizide, and metformin this am. She held lasix this am.The patient denies dizziness, but complained of chronic chest tightness x 2 months, 1-2/10 at present. IV 0.9% NACl hung open prior to lexiscan.   Discussed with [name], NP with an ok to do lexiscan. The BP 100/70 at discharge. The patient to keep record of BP's and call readings to Dr. Swaziland on 11-12-12. To go to ED if has a change in symptoms. Irean Hong, RN.

## 2012-11-12 ENCOUNTER — Ambulatory Visit (HOSPITAL_COMMUNITY): Payer: BC Managed Care – PPO | Attending: Cardiology

## 2012-11-12 ENCOUNTER — Ambulatory Visit (HOSPITAL_BASED_OUTPATIENT_CLINIC_OR_DEPARTMENT_OTHER): Payer: BC Managed Care – PPO

## 2012-11-12 DIAGNOSIS — R0789 Other chest pain: Secondary | ICD-10-CM | POA: Insufficient documentation

## 2012-11-12 DIAGNOSIS — E119 Type 2 diabetes mellitus without complications: Secondary | ICD-10-CM | POA: Insufficient documentation

## 2012-11-12 DIAGNOSIS — I059 Rheumatic mitral valve disease, unspecified: Secondary | ICD-10-CM | POA: Insufficient documentation

## 2012-11-12 DIAGNOSIS — E785 Hyperlipidemia, unspecified: Secondary | ICD-10-CM | POA: Insufficient documentation

## 2012-11-12 DIAGNOSIS — Z8679 Personal history of other diseases of the circulatory system: Secondary | ICD-10-CM

## 2012-11-12 DIAGNOSIS — I10 Essential (primary) hypertension: Secondary | ICD-10-CM | POA: Insufficient documentation

## 2012-11-12 DIAGNOSIS — R079 Chest pain, unspecified: Secondary | ICD-10-CM

## 2012-11-12 DIAGNOSIS — E7800 Pure hypercholesterolemia, unspecified: Secondary | ICD-10-CM

## 2012-11-12 DIAGNOSIS — IMO0001 Reserved for inherently not codable concepts without codable children: Secondary | ICD-10-CM

## 2012-11-12 DIAGNOSIS — R0989 Other specified symptoms and signs involving the circulatory and respiratory systems: Secondary | ICD-10-CM

## 2012-11-12 DIAGNOSIS — R072 Precordial pain: Secondary | ICD-10-CM | POA: Insufficient documentation

## 2012-11-12 MED ORDER — TECHNETIUM TC 99M SESTAMIBI GENERIC - CARDIOLITE
33.0000 | Freq: Once | INTRAVENOUS | Status: AC | PRN
  Administered 2012-11-12: 33 via INTRAVENOUS

## 2012-11-12 NOTE — Progress Notes (Signed)
Echocardiogram performed.  

## 2012-11-18 ENCOUNTER — Encounter: Payer: Self-pay | Admitting: Gastroenterology

## 2012-11-18 ENCOUNTER — Telehealth: Payer: Self-pay | Admitting: Cardiology

## 2012-11-18 ENCOUNTER — Ambulatory Visit (AMBULATORY_SURGERY_CENTER): Payer: BC Managed Care – PPO | Admitting: Gastroenterology

## 2012-11-18 VITALS — BP 99/44 | HR 73 | Temp 97.9°F | Resp 18 | Ht 63.0 in | Wt 244.0 lb

## 2012-11-18 DIAGNOSIS — K529 Noninfective gastroenteritis and colitis, unspecified: Secondary | ICD-10-CM

## 2012-11-18 DIAGNOSIS — D126 Benign neoplasm of colon, unspecified: Secondary | ICD-10-CM

## 2012-11-18 DIAGNOSIS — Z1211 Encounter for screening for malignant neoplasm of colon: Secondary | ICD-10-CM

## 2012-11-18 DIAGNOSIS — R197 Diarrhea, unspecified: Secondary | ICD-10-CM

## 2012-11-18 LAB — GLUCOSE, CAPILLARY
Glucose-Capillary: 99 mg/dL (ref 70–99)
Glucose-Capillary: 94 mg/dL (ref 70–99)

## 2012-11-18 MED ORDER — SODIUM CHLORIDE 0.9 % IV SOLN: 500.0000 mL | INTRAVENOUS | Status: DC

## 2012-11-18 NOTE — Progress Notes (Signed)
Lidocaine-40mg IV prior to Propofol InductionPropofol given over incremental dosages 

## 2012-11-18 NOTE — Progress Notes (Signed)
Patient denies any allergies to eggs or soy. Patient denies any problems with anesthesia.  

## 2012-11-18 NOTE — Patient Instructions (Addendum)
Discharge instructions given with verbal understanding. Biopsies taken. Resume previous medications. YOU HAD AN ENDOSCOPIC PROCEDURE TODAY AT THE [facility]: Refer to the procedure report that was given to you for any specific questions about what was found during the examination.  If the procedure report does not answer your questions, please call your gastroenterologist to clarify.  If you requested that your care partner not be given the details of your procedure findings, then the procedure report has been included in a sealed envelope for you to review at your convenience later.  YOU SHOULD EXPECT: Some feelings of bloating in the abdomen. Passage of more gas than usual.  Walking can help get rid of the air that was put into your GI tract during the procedure and reduce the bloating. If you had a lower endoscopy (such as a colonoscopy or flexible sigmoidoscopy) you may notice spotting of blood in your stool or on the toilet paper. If you underwent a bowel prep for your procedure, then you may not have a normal bowel movement for a few days.  DIET: Your first meal following the procedure should be a light meal and then it is ok to progress to your normal diet.  A half-sandwich or bowl of soup is an example of a good first meal.  Heavy or fried foods are harder to digest and may make you feel nauseous or bloated.  Likewise meals heavy in dairy and vegetables can cause extra gas to form and this can also increase the bloating.  Drink plenty of fluids but you should avoid alcoholic beverages for 24 hours.  ACTIVITY: Your care partner should take you home directly after the procedure.  You should plan to take it easy, moving slowly for the rest of the day.  You can resume normal activity the day after the procedure however you should NOT DRIVE or use heavy machinery for 24 hours (because of the sedation medicines used during the test).    SYMPTOMS TO REPORT IMMEDIATELY: A gastroenterologist  can be reached at any hour.  During normal business hours, 8:30 AM to 5:00 PM Monday through Friday, call [phone].  After hours and on weekends, please call the GI answering service at [phone] who will take a message and have the physician on call contact you.   Following lower endoscopy (colonoscopy or flexible sigmoidoscopy):  Excessive amounts of blood in the stool  Significant tenderness or worsening of abdominal pains  Swelling of the abdomen that is new, acute  Fever of 100F or higher  FOLLOW UP: If any biopsies were taken you will be contacted by phone or by letter within the next 1-3 weeks.  Call your gastroenterologist if you have not heard about the biopsies in 3 weeks.  Our staff will call the home number listed on your records the next business day following your procedure to check on you and address any questions or concerns that you may have at that time regarding the information given to you following your procedure. This is a courtesy call and so if there is no answer at the home number and we have not heard from you through the emergency physician on call, we will assume that you have returned to your regular daily activities without incident.  SIGNATURES/CONFIDENTIALITY: You and/or your care partner have signed paperwork which will be entered into your electronic medical record.  These signatures attest to the fact that that the information above on your After Visit Summary has been reviewed   and is understood.  Full responsibility of the confidentiality of this discharge information lies with you and/or your care-partner.  

## 2012-11-18 NOTE — Telephone Encounter (Signed)
F/up   Pt returned your call

## 2012-11-18 NOTE — Progress Notes (Signed)
Patient did not experience any of the following events: a burn prior to discharge; a fall within the facility; wrong site/side/patient/procedure/implant event; or a hospital transfer or hospital admission upon discharge from the facility. (G8907) Patient did not have preoperative order for IV antibiotic SSI prophylaxis. (G8918)  

## 2012-11-18 NOTE — Op Note (Signed)
[facility] [address] [place], [address]   COLONOSCOPY PROCEDURE REPORT  PATIENT: Dana Doyle, Dana Doyle  MR#: [id number] BIRTHDATE: 17-Dec-1950 , 61  yrs. old GENDER: Female ENDOSCOPIST: Mardella Layman, MD, [facility] REFERRED BY: PROCEDURE DATE:  11/18/2012 PROCEDURE:   Colonoscopy with biopsy First Screening Colonoscopy - Avg.  risk and is 50 yrs.  old or older - No.      History of Adenoma - Now for follow-up colonoscopy & has been > or = to 3 yrs.  N/A ASA CLASS:   Class II INDICATIONS: MEDICATIONS: propofol (Diprivan) 200mg  IV  DESCRIPTION OF PROCEDURE:   After the risks benefits and alternatives of the procedure were thoroughly explained, informed consent was obtained.  A digital rectal exam revealed no abnormalities of the rectum.   The LB [id number]  endoscope was introduced through the anus and advanced to the cecum, which was identified by both the appendix and ileocecal valve. No adverse events experienced.   The quality of the prep was excellent, using MoviPrep  The instrument was then slowly withdrawn as the colon was fully examined.      COLON FINDINGS: A normal appearing cecum, ileocecal valve, and appendiceal orifice were identified.  The ascending, hepatic flexure, transverse, splenic flexure, descending, sigmoid colon and rectum appeared unremarkable.  No polyps or cancers were seen. Retroflexed views revealed no abnormalities. Random colon biopsies done every 10 cm.The time to cecum=1 minutes 03 seconds. Withdrawal time=6 minutes 00 seconds.  The scope was withdrawn and the procedure completed. COMPLICATIONS: There were no complications.  ENDOSCOPIC IMPRESSION: Normal colon ...no polyps noted.  Random biopsies done to exclude microscopic-collagenous colitis.  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  RECOMMENDATIONS: 1.  Await biopsy results 2.  Continue current  medications 3.  Continue current colorectal screening recommendations for "routine risk" patients with a repeat colonoscopy in 10 years.   eSigned:  Mardella Layman, MD, [facility] 11/18/2012 2:40 PM   cc:

## 2012-11-18 NOTE — Telephone Encounter (Signed)
Returned call to patient no answer.LMTC. 

## 2012-11-18 NOTE — Progress Notes (Signed)
Called to room to assist during endoscopic procedure.  Patient ID and intended procedure confirmed with present staff. Received instructions for my participation in the procedure from the performing physician.  

## 2012-11-18 NOTE — Telephone Encounter (Signed)
Returned call to patient echo and myoview results given.Appointment scheduled with Dr.Jordan 01/20/13.

## 2012-11-18 NOTE — Telephone Encounter (Signed)
Pt rtn Dana Doyle's call

## 2012-11-19 ENCOUNTER — Telehealth: Payer: Self-pay | Admitting: *Deleted

## 2012-11-19 NOTE — Telephone Encounter (Signed)
Number identifier, left message, follow-up  

## 2012-11-24 ENCOUNTER — Encounter: Payer: Self-pay | Admitting: Gastroenterology

## 2012-12-03 ENCOUNTER — Ambulatory Visit: Payer: BC Managed Care – PPO | Admitting: Cardiology

## 2012-12-22 ENCOUNTER — Other Ambulatory Visit: Payer: Self-pay | Admitting: *Deleted

## 2012-12-22 NOTE — Telephone Encounter (Signed)
Dana Doyle called and left a message that she needs her thyroid medicine called into [facility] in [place].  I called patient back and left message to get dosage she takes.

## 2012-12-22 NOTE — Telephone Encounter (Addendum)
Dana Doyle  I see on her admission history that she has levothyroxine 60 mcg  listed.   I can order a 50 mcg or a 75 mcg but I am not at all familiar with a 60 mcg dosing.    Have her pharmacy fax over the order and let pt know what we are doing  thanks

## 2012-12-24 ENCOUNTER — Other Ambulatory Visit: Payer: Self-pay | Admitting: *Deleted

## 2012-12-24 MED ORDER — LEVOTHYROXINE SODIUM 50 MCG PO TABS: 60.0000 ug | ORAL_TABLET | Freq: Every day | ORAL | Status: DC

## 2012-12-25 ENCOUNTER — Other Ambulatory Visit: Payer: Self-pay | Admitting: Internal Medicine

## 2013-01-20 ENCOUNTER — Ambulatory Visit: Payer: BC Managed Care – PPO | Admitting: Cardiology

## 2013-02-03 ENCOUNTER — Telehealth: Payer: Self-pay | Admitting: *Deleted

## 2013-02-03 NOTE — Telephone Encounter (Signed)
Needs refill of lisinopril-hydrochlorothiazide

## 2013-02-03 NOTE — Telephone Encounter (Signed)
Called Dana Doyle and left a voicemail.  I let her know that we would be unable to do her pre operative paperwork due to the fact the she see a cardiologist. Her cardiologist would have to sign off on her surgery.  I let her know that there was no need to come to her appt tomorrow.  Left our call back # in case she has any questions.

## 2013-02-04 ENCOUNTER — Other Ambulatory Visit: Payer: Self-pay | Admitting: *Deleted

## 2013-02-04 ENCOUNTER — Ambulatory Visit: Payer: BC Managed Care – PPO | Admitting: Internal Medicine

## 2013-02-04 ENCOUNTER — Other Ambulatory Visit: Payer: Self-pay

## 2013-02-04 MED ORDER — LISINOPRIL-HYDROCHLOROTHIAZIDE 20-25 MG PO TABS: 1.0000 | ORAL_TABLET | Freq: Every day | ORAL | Status: DC

## 2013-02-04 NOTE — Telephone Encounter (Signed)
Refill request

## 2013-02-05 NOTE — Telephone Encounter (Signed)
Received fax from Dr.Stephen Pill's office requesting surgical clearance for rt total knee replacement.Dr.Jordan cleared patient for surgery.Form faxed back to fax # [phone].

## 2013-02-09 ENCOUNTER — Other Ambulatory Visit: Payer: Self-pay | Admitting: *Deleted

## 2013-02-15 ENCOUNTER — Telehealth: Payer: Self-pay | Admitting: *Deleted

## 2013-02-15 ENCOUNTER — Other Ambulatory Visit: Payer: Self-pay | Admitting: *Deleted

## 2013-02-15 MED ORDER — DICYCLOMINE HCL 20 MG PO TABS: 20.0000 mg | ORAL_TABLET | ORAL | Status: DC | PRN

## 2013-02-15 MED ORDER — METFORMIN HCL 500 MG PO TABS: 500.0000 mg | ORAL_TABLET | Freq: Two times a day (BID) | ORAL | Status: DC

## 2013-02-15 MED ORDER — DIAZEPAM 2 MG PO TABS: ORAL_TABLET | ORAL | Status: DC

## 2013-02-15 NOTE — Telephone Encounter (Signed)
Refill request

## 2013-02-15 NOTE — Telephone Encounter (Signed)
Called [facility] pharmacy to verify RX info  Also called pt for appt LVM message

## 2013-04-12 ENCOUNTER — Telehealth: Payer: Self-pay | Admitting: *Deleted

## 2013-04-12 DIAGNOSIS — E039 Hypothyroidism, unspecified: Secondary | ICD-10-CM

## 2013-04-12 MED ORDER — LEVOTHYROXINE SODIUM 50 MCG PO TABS: 50.0000 ug | ORAL_TABLET | Freq: Every day | ORAL | Status: DC

## 2013-04-12 NOTE — Telephone Encounter (Signed)
Rcvd fax from [facility] for Levothyroxin - will send refill e-script to pharm

## 2013-04-15 ENCOUNTER — Other Ambulatory Visit: Payer: Self-pay | Admitting: *Deleted

## 2013-08-19 ENCOUNTER — Other Ambulatory Visit: Payer: Self-pay | Admitting: Internal Medicine

## 2013-08-20 ENCOUNTER — Telehealth: Payer: Self-pay | Admitting: *Deleted

## 2013-08-20 NOTE — Telephone Encounter (Signed)
Pt states that she will not need a refill on her Metformin. She has moved her care to a MD that is closer to her home

## 2013-08-20 NOTE — Telephone Encounter (Signed)
Refill request. Last A1C 7/14. Notified pt via VM message that labs are due to be drawn.

## 2014-01-14 ENCOUNTER — Other Ambulatory Visit: Payer: Self-pay

## 2014-01-31 ENCOUNTER — Encounter: Payer: Self-pay | Admitting: Gastroenterology

## 2014-09-26 ENCOUNTER — Other Ambulatory Visit: Payer: Self-pay

## 2019-10-01 ENCOUNTER — Ambulatory Visit (INDEPENDENT_AMBULATORY_CARE_PROVIDER_SITE_OTHER): Payer: Medicare HMO | Admitting: Internal Medicine

## 2019-10-01 ENCOUNTER — Other Ambulatory Visit: Payer: Self-pay

## 2019-10-01 ENCOUNTER — Encounter: Payer: Self-pay | Admitting: Internal Medicine

## 2019-10-01 DIAGNOSIS — E039 Hypothyroidism, unspecified: Secondary | ICD-10-CM

## 2019-10-01 DIAGNOSIS — J9610 Chronic respiratory failure, unspecified whether with hypoxia or hypercapnia: Secondary | ICD-10-CM

## 2019-10-01 DIAGNOSIS — K580 Irritable bowel syndrome with diarrhea: Secondary | ICD-10-CM

## 2019-10-01 DIAGNOSIS — I10 Essential (primary) hypertension: Secondary | ICD-10-CM

## 2019-10-01 DIAGNOSIS — F419 Anxiety disorder, unspecified: Secondary | ICD-10-CM

## 2019-10-01 DIAGNOSIS — M159 Polyosteoarthritis, unspecified: Secondary | ICD-10-CM

## 2019-10-01 DIAGNOSIS — K219 Gastro-esophageal reflux disease without esophagitis: Secondary | ICD-10-CM

## 2019-10-01 DIAGNOSIS — M8949 Other hypertrophic osteoarthropathy, multiple sites: Secondary | ICD-10-CM

## 2019-10-01 DIAGNOSIS — M62838 Other muscle spasm: Secondary | ICD-10-CM

## 2019-10-01 DIAGNOSIS — M329 Systemic lupus erythematosus, unspecified: Secondary | ICD-10-CM

## 2019-10-01 DIAGNOSIS — E7800 Pure hypercholesterolemia, unspecified: Secondary | ICD-10-CM

## 2019-10-01 DIAGNOSIS — E119 Type 2 diabetes mellitus without complications: Secondary | ICD-10-CM

## 2019-10-01 MED ORDER — LEVOTHYROXINE SODIUM 50 MCG PO TABS: 50.0000 ug | ORAL_TABLET | Freq: Every day | ORAL | 1 refills | Status: DC

## 2019-10-01 NOTE — Progress Notes (Signed)
HPI  Pt presents to the clinic today to establish care and for management of the conditions listed below. She is transferring care from Dr. Coralyn Mark.  Anxiety: She is taking Sertraline as prescribed. She takes Valium as needed for sleep. She is not currently seeing a therapist. She denies depression, SI/HI.  OA: Mainly in her wrist, thumbs, elbow and left shoulder. She takes Prednisone as prescribed with good relief of symptoms.  DM 2: She is not taking any oral diabetic medication at this time. Improved after weight loss. She does not follow with endocrinology.  GERD: Triggered by stress. She denies breakthrough on Pantoprazole. There is no upper GI on file.  HLD: She is not taking any cholesterol lowering medication at this time. She tries to consume a low fat diet.  HTN: Her BP today is 136/74. She is taking Nifedipine, Metoprolol and Furosemide OTC. ECG from 10/2012 reviewed.  IBS: Mainly diarrhea. Mananaged on Bentyl. She has seen GI in the past.   Hypothyroidism: She denies any issues on her current dose of Levothyroxine (has been out x 1 month). She does not follow with endocrinology.  SLE: Managed on Plaquenil and Prednisone. She follows with [facility].  Chronic SOB: Currently being worked up by [employer]. On oxygen prn but wears most of the time.   Chronic Leg Spasms: Managed with Gabapentin as needed.  Flu: 12/2018 Tetanus: 2014 Pneumovax: 04/2016 Prevnar: 04/2017 Covid: never Zostovax: 07/2013 Shingrix: never Pap Smear: 2020, hysterectomy Mammogram: 2020 Bone Density: Colon Screening: 10/2012 Vision Screening: annually Dentist: annually  Past Medical History:  Diagnosis Date  . Anxiety   . Arthritis   . Diabetes mellitus without complication   . GERD (gastroesophageal reflux disease)   . Giardia   . Hyperlipidemia   . Hypertension   . IBS (irritable bowel syndrome)   . MVP (mitral valve prolapse)   . Obesity   . Thyroid disease     Current Outpatient  Medications  Medication Sig Dispense Refill  . b complex vitamins capsule Take 1 capsule by mouth daily.    . cholestyramine (QUESTRAN) 4 G packet Take 1 packet by mouth daily. 60 each 1  . diazepam (VALIUM) 2 MG tablet Take one at [facility] prn 12 tablet 0  . Diclofenac-Misoprostol (ARTHROTEC PO) Take 75 mg by mouth 2 (two) times daily before a meal.    . dicyclomine (BENTYL) 20 MG tablet Take 1 tablet (20 mg total) by mouth as needed. 30 tablet 1  . ergocalciferol (VITAMIN D2) 50000 UNITS capsule Take 5,000 Units by mouth daily.     . furosemide (LASIX) 20 MG tablet Take 1 tablet (20 mg total) by mouth daily. 90 tablet 0  . levothyroxine (SYNTHROID) 50 MCG tablet Take 1 tablet (50 mcg total) by mouth daily before breakfast. 90 tablet 1  . lisinopril-hydrochlorothiazide (PRINZIDE,ZESTORETIC) 20-25 MG per tablet Take 1 tablet by mouth daily. 90 tablet 1  . metFORMIN (GLUCOPHAGE) 500 MG tablet Take 1 tablet (500 mg total) by mouth 2 (two) times daily with a meal. One half tablet daily for a total of 500mg  daily 60 tablet 1  . naproxen sodium (ANAPROX) 220 MG tablet Take 220 mg by mouth as needed.    Marland Kitchen OVER THE COUNTER MEDICATION 595 mg by Per post-pyloric tube route daily. potassium     No current facility-administered medications for this visit.    Allergies  Allergen Reactions  . Phenergan [Promethazine Hcl] Anaphylaxis    Family History  Adopted: Yes  Social History   Socioeconomic History  . Marital status: Married    Spouse name: Not on file  . Number of children: 3  . Years of education: Not on file  . Highest education level: Not on file  Occupational History  . Not on file  Tobacco Use  . Smoking status: Never Smoker  . Smokeless tobacco: Never Used  Substance and Sexual Activity  . Alcohol use: No  . Drug use: No  . Sexual activity: Never    Birth control/protection: Surgical  Other Topics Concern  . Not on file  Social History Narrative   Daily caffeine    Social  Determinants of Health   Financial Resource Strain:   . Difficulty of Paying Living Expenses:   Food Insecurity:   . Worried About Charity fundraiser in the Last Year:   . Arboriculturist in the Last Year:   Transportation Needs:   . Film/video editor (Medical):   Marland Kitchen Lack of Transportation (Non-Medical):   Physical Activity:   . Days of Exercise per Week:   . Minutes of Exercise per Session:   Stress:   . Feeling of Stress :   Social Connections:   . Frequency of Communication with Friends and Family:   . Frequency of Social Gatherings with Friends and Family:   . Attends Religious Services:   . Active Member of Clubs or Organizations:   . Attends Archivist Meetings:   Marland Kitchen Marital Status:   Intimate Partner Violence:   . Fear of Current or Ex-Partner:   . Emotionally Abused:   Marland Kitchen Physically Abused:   . Sexually Abused:     ROS:  Constitutional: Denies fever, malaise, fatigue, headache or abrupt weight changes.  HEENT: Denies eye pain, eye redness, ear pain, ringing in the ears, wax buildup, runny nose, nasal congestion, bloody nose, or sore throat. Respiratory: Pt reports chronic SOB. Denies difficulty breathing, cough or sputum production.   Cardiovascular: Denies chest pain, chest tightness, palpitations or swelling in the hands or feet.  Gastrointestinal: Pt reports diarrhea. Denies abdominal pain, bloating, constipation, or blood in the stool.  GU: Denies frequency, urgency, pain with urination, blood in urine, odor or discharge. Musculoskeletal: Pt reports muscle spasms in legs. Denies decrease in range of motion, difficulty with gait, or joint pain and swelling.  Skin: Denies redness, rashes, lesions or ulcercations.  Neurological: Denies dizziness, difficulty with memory, difficulty with speech or problems with balance and coordination.  Psych: Pt has a history of anxiety. Denies depression, SI/HI.  No other specific complaints in a complete review of  systems (except as listed in HPI above).  PE: BP 136/74   Pulse 88   Temp (!) 96.9 F (36.1 C) (Temporal)   Wt 188 lb (85.3 kg)   SpO2 96%   BMI 33.30 kg/m   Wt Readings from Last 3 Encounters:  11/18/12 244 lb (110.7 kg)  11/10/12 240 lb (108.9 kg)  11/03/12 244 lb (110.7 kg)    General: Appears her stated age, obese, in NAD. HEENT: Head: normal shape and size; Eyes: sclera white, no icterus;  Neck: Neck supple, trachea midline. No masses, lumps present.  Cardiovascular: Normal rate and rhythm. S1,S2 noted.  No murmur, rubs or gallops noted.  Pulmonary/Chest: Normal effort and positive vesicular breath sounds. No respiratory distress. No wheezes, rales or ronchi noted.  Abdomen: Soft and nontender.  Musculoskeletal: No difficulty with gait.  Neurological: Alert and oriented.  Psychiatric: Mildly anxious  appearing. Judgment and thought content normal.  BMET    Component Value Date/Time   NA 142 10/20/2012 1245   K 4.3 10/20/2012 1245   CL 103 10/20/2012 1245   CO2 28 10/20/2012 1245   GLUCOSE 91 10/20/2012 1245   BUN 15 10/20/2012 1245   CREATININE 0.95 10/20/2012 1245   CALCIUM 9.5 10/20/2012 1245    Lipid Panel     Component Value Date/Time   CHOL 287 (H) 10/20/2012 1245   TRIG 178 (H) 10/20/2012 1245   HDL 49 10/20/2012 1245   CHOLHDL 5.9 10/20/2012 1245   VLDL 36 10/20/2012 1245   LDLCALC 202 (H) 10/20/2012 1245    CBC    Component Value Date/Time   WBC 6.7 10/20/2012 1245   RBC 4.41 10/20/2012 1245   HGB 12.9 10/20/2012 1245   HCT 37.8 10/20/2012 1245   PLT 343 10/20/2012 1245   MCV 85.7 10/20/2012 1245   MCH 29.3 10/20/2012 1245   MCHC 34.1 10/20/2012 1245   RDW 14.8 10/20/2012 1245   LYMPHSABS 1.8 10/20/2012 1245   MONOABS 0.5 10/20/2012 1245   EOSABS 0.1 10/20/2012 1245   BASOSABS 0.0 10/20/2012 1245    Hgb A1C Lab Results  Component Value Date   HGBA1C 6.0 (H) 10/20/2012     Assessment and Plan:

## 2019-10-07 ENCOUNTER — Encounter: Payer: Self-pay | Admitting: Internal Medicine

## 2019-10-07 DIAGNOSIS — M62838 Other muscle spasm: Secondary | ICD-10-CM | POA: Insufficient documentation

## 2019-10-07 DIAGNOSIS — M329 Systemic lupus erythematosus, unspecified: Secondary | ICD-10-CM | POA: Insufficient documentation

## 2019-10-07 DIAGNOSIS — F419 Anxiety disorder, unspecified: Secondary | ICD-10-CM | POA: Insufficient documentation

## 2019-10-07 DIAGNOSIS — K219 Gastro-esophageal reflux disease without esophagitis: Secondary | ICD-10-CM | POA: Insufficient documentation

## 2019-10-07 DIAGNOSIS — J9610 Chronic respiratory failure, unspecified whether with hypoxia or hypercapnia: Secondary | ICD-10-CM | POA: Insufficient documentation

## 2019-10-07 NOTE — Assessment & Plan Note (Signed)
Continue gabapentin.

## 2019-10-07 NOTE — Assessment & Plan Note (Signed)
Continue Prednisone She will continue to follow with rheumatology

## 2019-10-07 NOTE — Assessment & Plan Note (Signed)
We will check C met and lipid profile at annual exam Encouraged her to consume a low-fat diet

## 2019-10-07 NOTE — Assessment & Plan Note (Signed)
Continue Sertraline We will monitor

## 2019-10-07 NOTE — Assessment & Plan Note (Signed)
Will refill Levothyroxine and plan to repeat TSH and free T4 in 1 month

## 2019-10-07 NOTE — Assessment & Plan Note (Signed)
Controlled on Nifedipine, Metoprolol and Furosemide We will monitor

## 2019-10-07 NOTE — Assessment & Plan Note (Signed)
Continue oxygen She will continue to follow with [facility]

## 2019-10-07 NOTE — Assessment & Plan Note (Signed)
We will check A1c with annual exam Consume a low carb diet

## 2019-10-07 NOTE — Assessment & Plan Note (Signed)
Continue Plaquenil and Prednisone She will continue to follow with rheumatology

## 2019-10-07 NOTE — Patient Instructions (Signed)

## 2019-10-07 NOTE — Assessment & Plan Note (Signed)
Continue Pantoprazole We will monitor

## 2019-10-07 NOTE — Assessment & Plan Note (Signed)
Continue Bentyl for now

## 2019-10-08 ENCOUNTER — Other Ambulatory Visit: Payer: Self-pay | Admitting: Internal Medicine

## 2019-10-08 NOTE — Telephone Encounter (Signed)
Patient called today requesting refills on 3 medications    Furosemide 20mg   Pantoprazole 40mg   Diazepam, pt stated she is taking 5mg     [facility] [place]

## 2019-10-11 MED ORDER — PANTOPRAZOLE SODIUM 40 MG PO TBEC: 40.0000 mg | DELAYED_RELEASE_TABLET | Freq: Every day | ORAL | 1 refills | Status: DC

## 2019-10-11 MED ORDER — FUROSEMIDE 20 MG PO TABS: 20.0000 mg | ORAL_TABLET | Freq: Every day | ORAL | 1 refills | Status: DC

## 2019-10-11 NOTE — Telephone Encounter (Signed)
When is the last time she had this filled. At her new pt appt, it was 2 mg, now 5 mg?

## 2019-10-11 NOTE — Telephone Encounter (Signed)
Patient left a voicemail stating that she had called Friday around noon requesting refills on 3 of her medications. Patient stated that she was in about 2 weeks ago and was told by Webb Silversmith NP that when she needed refilled to just call the office. Patient stated that she is now out of her medications. Patient stated that she needs the refills sent in today.  See note regarding Diazepam

## 2019-10-12 NOTE — Telephone Encounter (Signed)
Patient left a voicemail stating that she really needs the refill on her Diazepam 5 mg. Patient stated  the last time she got it filled was 05/2018 and #30 has lasted her this long. Patient stated that she has lupus and other health issues and uses this to help her get sleep at times. Patient stated that she has been under a lot of stress because her husband has heart failure and he has recently spend 4 nights in the hospital. Patient stated that she is in great need of some sleep. Patient would like a call back when this has been done.

## 2019-10-12 NOTE — Telephone Encounter (Signed)
Ok, that's fine. I need to know why 2 mg is on her list but she is requesting 5 mg?

## 2019-10-12 NOTE — Telephone Encounter (Signed)
I spoke to pt and she was taking 5mg  and that is what the bottle says... I did look under reconcile and history of Rx from 2017 says Valium 5mg .... please advise

## 2019-10-13 MED ORDER — DIAZEPAM 5 MG PO TABS
5.0000 mg | ORAL_TABLET | Freq: Every day | ORAL | 0 refills | Status: DC | PRN
Start: 2019-10-13 — End: 2019-12-23

## 2019-10-13 NOTE — Telephone Encounter (Signed)
refilled 

## 2019-10-13 NOTE — Addendum Note (Signed)
Addended by: Jearld Fenton on: 10/13/2019 01:37 PM   Modules accepted: Orders

## 2019-12-10 ENCOUNTER — Telehealth: Payer: Self-pay | Admitting: *Deleted

## 2019-12-10 DIAGNOSIS — E039 Hypothyroidism, unspecified: Secondary | ICD-10-CM

## 2019-12-10 NOTE — Telephone Encounter (Signed)
Pt left VM at Triage. Pt said when she was in for her new pt appt in July PCP changed her dose of levothyroxine from 75 mcg to 50 mcg. Pt said that she has been feeling "bad" since the does decrease. Pt said she has been very fatigued and tired and can't sleep, and that usually happens when her TSH levels are off. Pt is requesting a lab appt to have thyroid levels checked to see if she need her thyroid dose adjusted. Pt wants to go back to the 75 mcg of med

## 2019-12-15 ENCOUNTER — Other Ambulatory Visit (INDEPENDENT_AMBULATORY_CARE_PROVIDER_SITE_OTHER): Payer: Medicare HMO

## 2019-12-15 ENCOUNTER — Other Ambulatory Visit: Payer: Self-pay

## 2019-12-15 DIAGNOSIS — E039 Hypothyroidism, unspecified: Secondary | ICD-10-CM

## 2019-12-15 LAB — TSH: TSH: 3.63 u[IU]/mL (ref 0.35–4.50)

## 2019-12-22 ENCOUNTER — Other Ambulatory Visit: Payer: Self-pay | Admitting: Internal Medicine

## 2019-12-23 NOTE — Telephone Encounter (Signed)
Last filled 10/13/2019... please advise 

## 2019-12-31 ENCOUNTER — Telehealth: Payer: Self-pay

## 2019-12-31 NOTE — Telephone Encounter (Signed)
I spoke with pt; been going on for a while; pt has spoken to [facility] previously about this and pt has a call into the PA at [facility]; pt said the ER is too expensive and pt does not want to go to UC. UC & ED precautions given and pt voiced understanding and said she had to go because she is on the other line with [employer]. Sending note to Avie Echevaria NP.

## 2019-12-31 NOTE — Telephone Encounter (Signed)
Noted, she can schedule an appt here anytime if she would like

## 2019-12-31 NOTE — Telephone Encounter (Signed)
[place] [facility] - Client TELEPHONE ADVICE RECORD [facility] Patient Name: Dana Doyle Gender: Female DOB: 23-Jul-1950 Age: 69 Y 9 M 19 D Return Phone Number: [phone] (Primary) Address: City/State/Zip: [place] [address] Client [place] [facility] - Client Client Site [place] - Day Physician Webb Silversmith - NP Contact Type Call Who Is Calling Patient / Member / Family / Caregiver Call Type Triage / Clinical Relationship To Patient Self Return Phone Number [phone] (Primary) Chief Complaint WEAKNESS - sudden on one side of face or body Reason for Call Symptomatic / Request for [place] states her left leg and foot wont raise and sometimes her foot drags. Translation No Nurse Assessment Nurse: Wynetta Emery, RN, Santiago Glad Date/Time Eilene Ghazi Time): 12/31/2019 1:35:51 PM Confirm and document reason for call. If symptomatic, describe symptoms. ---Caller states she has some weakness in her left leg/foot. Kind of feels like it does when you get a shot of Novocain at the dentist. Left knee was replaced 2 years ago. Symptoms started about a month ago, symptoms have progressed. Caller states she has multiple health issues-uses a walker already. She does have a history of lupus. Does the patient have any new or worsening symptoms? ---Yes Will a triage be completed? ---Yes Related visit to physician within the last 2 weeks? ---N/A Does the PT have any chronic conditions? (i.e. diabetes, asthma, this includes High risk factors for pregnancy, etc.) ---Yes List chronic conditions. ---Lupus, Respiratory condition, Is this a behavioral health or substance abuse call? ---No Guidelines Guideline Title Affirmed Question Affirmed Notes Nurse Date/Time Eilene Ghazi Time) Neurologic Deficit Headache (and neurologic deficit) Wynetta Emery, RN, Santiago Glad 12/31/2019 1:39:17 PM Disp. Time Eilene Ghazi Time) Disposition  Final User 12/31/2019 1:34:54 PM Send to Urgent Ezra Sites 12/31/2019 1:44:41 PM Go to ED Now Yes Wynetta Emery, RN, Ralene Bathe NOTE: All timestamps contained within this report are represented as [place] Standard Time. CONFIDENTIALTY NOTICE: This fax transmission is intended only for the addressee. It contains information that is legally privileged, confidential or otherwise protected from use or disclosure. If you are not the intended recipient, you are strictly prohibited from reviewing, disclosing, copying using or disseminating any of this information or taking any action in reliance on or regarding this information. If you have received this fax in error, please notify us immediately by telephone so that we can arrange for its return to [place]. Phone: [phone], Toll-Free: [phone], Fax: [phone] Page: 2 of 2 Call Id: [id number] [place] Disagree/Comply Disagree Caller Understands Yes PreDisposition Did not know what to do Care Advice Given Per Guideline GO TO ED NOW: * You need to be seen in the Emergency Department. NOTE TO TRIAGER - DRIVING: * Another adult should drive. * informed patient she should be evaluated sooner than later, patient states her husband can drive her, he will be home in an hour or so. RN informed patient that she can always call 911 if at any point she feels in distress. Patient states that she will be going to [facility] ED.

## 2020-01-06 ENCOUNTER — Encounter: Payer: Self-pay | Admitting: Internal Medicine

## 2020-01-07 MED ORDER — METOPROLOL SUCCINATE ER 25 MG PO TB24: 25.0000 mg | ORAL_TABLET | Freq: Every day | ORAL | 0 refills | Status: DC

## 2020-02-08 ENCOUNTER — Other Ambulatory Visit: Payer: Self-pay | Admitting: Internal Medicine

## 2020-02-11 ENCOUNTER — Other Ambulatory Visit: Payer: Self-pay | Admitting: *Deleted

## 2020-02-11 MED ORDER — FUROSEMIDE 20 MG PO TABS: 20.0000 mg | ORAL_TABLET | Freq: Two times a day (BID) | ORAL | 0 refills | Status: DC

## 2020-02-11 NOTE — Telephone Encounter (Signed)
Patient left a voicemail stating that she tried to get a refill on her furosemide and was advised that she can not get a refill until December. Patient stated the pharmacist told her that the reason is because the way that the script is written for one a day. Patient stated that she has been taking one twice a day for years. Patient requested that the script be sent in the way that she has been taking the medication [facility] [facility]

## 2020-02-11 NOTE — Addendum Note (Signed)
Addended by: Jearld Fenton on: 02/11/2020 02:23 PM   Modules accepted: Orders

## 2020-02-11 NOTE — Telephone Encounter (Signed)
[facility]

## 2020-03-06 DIAGNOSIS — F320 Major depressive disorder, single episode, mild: Secondary | ICD-10-CM | POA: Diagnosis not present

## 2020-03-06 DIAGNOSIS — M8589 Other specified disorders of bone density and structure, multiple sites: Secondary | ICD-10-CM | POA: Diagnosis not present

## 2020-03-06 DIAGNOSIS — M4802 Spinal stenosis, cervical region: Secondary | ICD-10-CM | POA: Diagnosis not present

## 2020-03-06 DIAGNOSIS — M329 Systemic lupus erythematosus, unspecified: Secondary | ICD-10-CM | POA: Diagnosis not present

## 2020-03-06 DIAGNOSIS — J849 Interstitial pulmonary disease, unspecified: Secondary | ICD-10-CM | POA: Diagnosis not present

## 2020-03-06 DIAGNOSIS — Z79899 Other long term (current) drug therapy: Secondary | ICD-10-CM | POA: Diagnosis not present

## 2020-03-06 DIAGNOSIS — M4312 Spondylolisthesis, cervical region: Secondary | ICD-10-CM | POA: Diagnosis not present

## 2020-03-06 DIAGNOSIS — M47812 Spondylosis without myelopathy or radiculopathy, cervical region: Secondary | ICD-10-CM | POA: Diagnosis not present

## 2020-03-06 DIAGNOSIS — M1812 Unilateral primary osteoarthritis of first carpometacarpal joint, left hand: Secondary | ICD-10-CM | POA: Diagnosis not present

## 2020-03-06 DIAGNOSIS — M189 Osteoarthritis of first carpometacarpal joint, unspecified: Secondary | ICD-10-CM | POA: Diagnosis not present

## 2020-03-06 DIAGNOSIS — H269 Unspecified cataract: Secondary | ICD-10-CM | POA: Diagnosis not present

## 2020-03-06 DIAGNOSIS — Z7952 Long term (current) use of systemic steroids: Secondary | ICD-10-CM | POA: Diagnosis not present

## 2020-03-06 DIAGNOSIS — M542 Cervicalgia: Secondary | ICD-10-CM | POA: Diagnosis not present

## 2020-03-06 DIAGNOSIS — M21372 Foot drop, left foot: Secondary | ICD-10-CM | POA: Diagnosis not present

## 2020-03-07 ENCOUNTER — Encounter: Payer: Self-pay | Admitting: Internal Medicine

## 2020-03-15 DIAGNOSIS — R0902 Hypoxemia: Secondary | ICD-10-CM | POA: Diagnosis not present

## 2020-03-30 ENCOUNTER — Other Ambulatory Visit: Payer: Self-pay | Admitting: Internal Medicine

## 2020-03-30 DIAGNOSIS — E039 Hypothyroidism, unspecified: Secondary | ICD-10-CM

## 2020-04-05 ENCOUNTER — Other Ambulatory Visit: Payer: Self-pay | Admitting: Internal Medicine

## 2020-04-07 ENCOUNTER — Encounter: Payer: Self-pay | Admitting: Internal Medicine

## 2020-04-07 MED ORDER — SERTRALINE HCL 100 MG PO TABS
100.0000 mg | ORAL_TABLET | Freq: Every day | ORAL | 0 refills | Status: DC
Start: 2020-04-07 — End: 2020-07-17

## 2020-04-15 DIAGNOSIS — R0902 Hypoxemia: Secondary | ICD-10-CM | POA: Diagnosis not present

## 2020-04-20 ENCOUNTER — Other Ambulatory Visit: Payer: Self-pay

## 2020-04-20 NOTE — Telephone Encounter (Signed)
Vm left at triage.  Pt needs refill.

## 2020-04-21 MED ORDER — METOPROLOL SUCCINATE ER 25 MG PO TB24: 25.0000 mg | ORAL_TABLET | Freq: Every day | ORAL | 0 refills | Status: DC

## 2020-04-25 ENCOUNTER — Ambulatory Visit (INDEPENDENT_AMBULATORY_CARE_PROVIDER_SITE_OTHER): Payer: Medicare HMO | Admitting: Internal Medicine

## 2020-04-25 ENCOUNTER — Other Ambulatory Visit: Payer: Self-pay

## 2020-04-25 ENCOUNTER — Encounter: Payer: Self-pay | Admitting: Internal Medicine

## 2020-04-25 VITALS — BP 106/70 | HR 67 | Temp 97.5°F | Ht 62.5 in | Wt 187.0 lb

## 2020-04-25 DIAGNOSIS — E119 Type 2 diabetes mellitus without complications: Secondary | ICD-10-CM

## 2020-04-25 DIAGNOSIS — E039 Hypothyroidism, unspecified: Secondary | ICD-10-CM | POA: Diagnosis not present

## 2020-04-25 DIAGNOSIS — Z0000 Encounter for general adult medical examination without abnormal findings: Secondary | ICD-10-CM

## 2020-04-25 DIAGNOSIS — M8949 Other hypertrophic osteoarthropathy, multiple sites: Secondary | ICD-10-CM | POA: Diagnosis not present

## 2020-04-25 DIAGNOSIS — I10 Essential (primary) hypertension: Secondary | ICD-10-CM | POA: Diagnosis not present

## 2020-04-25 DIAGNOSIS — M159 Polyosteoarthritis, unspecified: Secondary | ICD-10-CM

## 2020-04-25 DIAGNOSIS — E7800 Pure hypercholesterolemia, unspecified: Secondary | ICD-10-CM | POA: Diagnosis not present

## 2020-04-25 DIAGNOSIS — J9611 Chronic respiratory failure with hypoxia: Secondary | ICD-10-CM | POA: Diagnosis not present

## 2020-04-25 DIAGNOSIS — M329 Systemic lupus erythematosus, unspecified: Secondary | ICD-10-CM

## 2020-04-25 DIAGNOSIS — F419 Anxiety disorder, unspecified: Secondary | ICD-10-CM

## 2020-04-25 DIAGNOSIS — K580 Irritable bowel syndrome with diarrhea: Secondary | ICD-10-CM

## 2020-04-25 DIAGNOSIS — Z1159 Encounter for screening for other viral diseases: Secondary | ICD-10-CM

## 2020-04-25 DIAGNOSIS — K219 Gastro-esophageal reflux disease without esophagitis: Secondary | ICD-10-CM

## 2020-04-25 LAB — COMPREHENSIVE METABOLIC PANEL
ALT: 12 U/L (ref 0–35)
AST: 13 U/L (ref 0–37)
Albumin: 4.4 g/dL (ref 3.5–5.2)
Alkaline Phosphatase: 83 U/L (ref 39–117)
BUN: 17 mg/dL (ref 6–23)
CO2: 30 mEq/L (ref 19–32)
Calcium: 10.1 mg/dL (ref 8.4–10.5)
Chloride: 101 mEq/L (ref 96–112)
Creatinine, Ser: 1.09 mg/dL (ref 0.40–1.20)
GFR: 52.33 mL/min — ABNORMAL LOW (ref >60.00)
Glucose, Bld: 81 mg/dL (ref 70–99)
Potassium: 3.8 mEq/L (ref 3.5–5.1)
Sodium: 140 mEq/L (ref 135–145)
Total Bilirubin: 0.5 mg/dL (ref 0.2–1.2)
Total Protein: 6.3 g/dL (ref 6.0–8.3)

## 2020-04-25 LAB — LIPID PANEL
Cholesterol: 308 mg/dL — ABNORMAL HIGH (ref 0–200)
HDL: 63 mg/dL (ref >39.00)
NonHDL: 244.81
Total CHOL/HDL Ratio: 5
Triglycerides: 226 mg/dL — ABNORMAL HIGH (ref 0.0–149.0)
VLDL: 45.2 mg/dL — ABNORMAL HIGH (ref 0.0–40.0)

## 2020-04-25 LAB — TSH: TSH: 4.78 u[IU]/mL — ABNORMAL HIGH (ref 0.35–4.50)

## 2020-04-25 LAB — CBC
HCT: 40 % (ref 36.0–46.0)
Hemoglobin: 13.5 g/dL (ref 12.0–15.0)
MCHC: 33.7 g/dL (ref 30.0–36.0)
MCV: 88.6 fl (ref 78.0–100.0)
Platelets: 303 10*3/uL (ref 150.0–400.0)
RBC: 4.51 Mil/uL (ref 3.87–5.11)
RDW: 17.5 % — ABNORMAL HIGH (ref 11.5–15.5)
WBC: 6.2 10*3/uL (ref 4.0–10.5)

## 2020-04-25 LAB — LDL CHOLESTEROL, DIRECT: Direct LDL: 207 mg/dL

## 2020-04-25 LAB — HEMOGLOBIN A1C: Hgb A1c MFr Bld: 5.3 % (ref 4.6–6.5)

## 2020-04-25 LAB — T4, FREE: Free T4: 0.74 ng/dL (ref 0.60–1.60)

## 2020-04-25 NOTE — Assessment & Plan Note (Signed)
C met and lipid profile today °Encouraged her to consume a low-fat diet ° °

## 2020-04-25 NOTE — Assessment & Plan Note (Signed)
Continue Bentyl as needed 

## 2020-04-25 NOTE — Patient Instructions (Signed)

## 2020-04-25 NOTE — Assessment & Plan Note (Signed)
Stable on Sertraline and Valium, we not indicated We will monitor

## 2020-04-25 NOTE — Assessment & Plan Note (Signed)
Managed on Prednisone, prescribed by rheumatology Encouraged exercise for weight loss

## 2020-04-25 NOTE — Assessment & Plan Note (Signed)
TSH and free T4 today We will adjust levothyroxine if needed based on labs 

## 2020-04-25 NOTE — Assessment & Plan Note (Signed)
Managed on Rituxan, Plaquenil and Prednisone C met today She will continue to follow with [facility] rheumatology

## 2020-04-25 NOTE — Assessment & Plan Note (Signed)
A1c today Encouraged her to consume a low carb diet and exercise weight loss No medications at this time Encourage routine eye exams Encourage routine foot exams Immunizations UTD

## 2020-04-25 NOTE — Assessment & Plan Note (Signed)
Continue Symbicort and Albuterol She will continue to follow with [facility] pulmonology

## 2020-04-25 NOTE — Assessment & Plan Note (Signed)
Labile per patient Low today but patient denies lightheadedness or orthostasis Continue Nifedipine, Metoprolol and Furosemide Reinforced DASH diet and exercise for weight loss C met today

## 2020-04-25 NOTE — Assessment & Plan Note (Signed)
Continue Pantoprazole for now CBC and C met today

## 2020-04-25 NOTE — Progress Notes (Signed)
HPI:  Patient presents the clinic today for her subsequent annual Medicare wellness exam.  She is also due to follow-up chronic conditions.  Anxiety: Managed on Sertraline.  She takes Valium as needed for sleep.  She is not currently seeing a therapist.  She denies depression, SI/HI.  OA: Multiple joints.  She takes Prednisone as prescribed with good relief of symptoms.  DM 2:: There is no A1C on file. She is not taking any oral diabetic medication at this time.  She does not routinely check her sugars.  She checks her feet routinely.  GERD: She denies breakthrough on Pantoprazole.  There is no upper GI on file.  HLD: There is no LDL on file. She is not taking any cholesterol-lowering medications at this time.  She tries to consume a low-fat diet.  HTN: Her BP today is 106/70.  She is taking Nifedipine, Metoprolol and Furosemide as prescribed.  ECG from 10/2012 reviewed.  IBS: Mainly diarrhea.  Managed on Bentyl.  She is not currently following GI.  Hypothyroidism: She denies any issues on her current dose of Levothyroxine.  She does not follow with endocrinology.  SLE: Managed on Rituxin, Plaquenil and Prednisone.  She follows with [place] rheumatology.  Chronic Respiratory Failure: Managed on Symbicort. She uses Albuterol intermittently about 3 x week. She is following with pulmonology.  Past Medical History:  Diagnosis Date  . Anxiety   . Arthritis   . Diabetes mellitus without complication ([place])   . GERD (gastroesophageal reflux disease)   . Giardia   . Hyperlipidemia   . Hypertension   . IBS (irritable bowel syndrome)   . MVP (mitral valve prolapse)   . Obesity   . Thyroid disease     Current Outpatient Medications  Medication Sig Dispense Refill  . albuterol (VENTOLIN HFA) 108 (90 Base) MCG/ACT inhaler Inhale into the lungs every 6 (six) hours as needed for wheezing or shortness of breath.    . Cholecalciferol (VITAMIN D3) 250 MCG (10000 UT) capsule Take 10,000 Units by  mouth daily.    . diazepam (VALIUM) 5 MG tablet TAKE 1 TABLET BY MOUTH DAILY AS NEEDED FOR ANXIETY. 15 tablet 0  . furosemide (LASIX) 20 MG tablet Take 1 tablet (20 mg total) by mouth 2 (two) times daily. 180 tablet 0  . gabapentin (NEURONTIN) 100 MG capsule Take 100 mg by mouth at bedtime. 1-3 capsules as needed    . hydroxychloroquine (PLAQUENIL) 200 MG tablet Take 2 tablets by mouth daily.    Marland Kitchen levothyroxine (SYNTHROID) 50 MCG tablet TAKE 1 TABLET BY MOUTH DAILY BEFORE BREAKFAST 90 tablet 0  . metoprolol succinate (TOPROL-XL) 25 MG 24 hr tablet Take 1 tablet (25 mg total) by mouth daily. 90 tablet 0  . NIFEdipine (ADALAT CC) 30 MG 24 hr tablet Take 1 tablet by mouth daily.    . ondansetron (ZOFRAN) 4 MG tablet Take 4 mg by mouth every 8 (eight) hours as needed for nausea or vomiting.    . pantoprazole (PROTONIX) 40 MG tablet TAKE 1 TABLET BY MOUTH EVERY DAY 90 tablet 0  . Potassium 99 MG TABS Take by mouth.    . predniSONE (DELTASONE) 10 MG tablet Take 10 mg by mouth daily with breakfast.    . sertraline (ZOLOFT) 100 MG tablet Take 1 tablet (100 mg total) by mouth daily. 90 tablet 0   No current facility-administered medications for this visit.    Allergies  Allergen Reactions  . Belimumab Other (See Comments) and Nausea And  Vomiting  . Mycophenolate Mofetil Other (See Comments) and Nausea And Vomiting  . Phenergan [Promethazine Hcl] Anaphylaxis  . Codeine Hives, Itching and Other (See Comments)  . Meloxicam Hives  . Statins Other (See Comments)    Muscle spasm and cramps  . Ezetimibe     Family History  Adopted: Yes  Problem Relation Age of Onset  . Cancer Brother     Social History   Socioeconomic History  . Marital status: Married    Spouse name: Not on file  . Number of children: 3  . Years of education: Not on file  . Highest education level: Not on file  Occupational History  . Not on file  Tobacco Use  . Smoking status: Never Smoker  . Smokeless tobacco: Never  Used  Substance and Sexual Activity  . Alcohol use: No  . Drug use: No  . Sexual activity: Never    Birth control/protection: Surgical  Other Topics Concern  . Not on file  Social History Narrative   Daily caffeine    Social Determinants of Health   Financial Resource Strain: Not on file  Food Insecurity: Not on file  Transportation Needs: Not on file  Physical Activity: Not on file  Stress: Not on file  Social Connections: Not on file  Intimate Partner Violence: Not on file    Hospitiliaztions: None  Health Maintenance:    Flu: 12/2019  Tetanus: 2014  Pneumovax: 04/2016  Prevnar: 04/2017  Covid: Pfizer x 2  Zostavax: 2015  Shingrix: never  Mammogram: 2020  Pap Smear:2020  Bone Density: scheduled at [facility]  Colon Screening: 2014  Eye Doctor: annually  Dental Exam: as needed   Providers:   PCP: Webb Silversmith, NP-C  Pulmonolo: Dr. Lavonda Jumbo  Rjeumatology: Dr. Cherlynn Kaiser   I have personally reviewed and have noted:  1. The patient's medical and social history 2. Their use of alcohol, tobacco or illicit drugs 3. Their current medications and supplements 4. The patient's functional ability including ADL's, fall risks, home safety risks and  hearing or visual impairment. 5. Diet and physical activities 6. Evidence for depression or mood disorder  Subjective:   Review of Systems:   Constitutional: Denies fever, malaise, fatigue, headache or abrupt weight changes.  HEENT: Denies eye pain, eye redness, ear pain, ringing in the ears, wax buildup, runny nose, nasal congestion, bloody nose, or sore throat. Respiratory: Pt reports intermittent SOB. Denies difficulty breathing, cough or sputum production.   Cardiovascular: Denies chest pain, chest tightness, palpitations or swelling in the hands or feet.  Gastrointestinal: Patient reports intermittent diarrhea.  Denies abdominal pain, bloating, constipation, or blood in the stool.  GU: Denies urgency, frequency, pain  with urination, burning sensation, blood in urine, odor or discharge. Musculoskeletal: Patient reports intermittent joint pain.  Denies decrease in range of motion, difficulty with gait, muscle pain or joint swelling.  Skin: Denies redness, rashes, lesions or ulcercations.  Neurological: Denies dizziness, difficulty with memory, difficulty with speech or problems with balance and coordination.  Psych: Patient has a history of anxiety.  Denies depression, SI/HI.  No other specific complaints in a complete review of systems (except as listed in HPI above).  Objective:  PE:   BP 106/70   Pulse 67   Temp (!) 97.5 F (36.4 C) (Temporal)   Ht 5' 2.5" (1.588 m)   Wt 187 lb (84.8 kg)   SpO2 97%   BMI 33.66 kg/m   Wt Readings from Last 3 Encounters:  10/01/19 188  lb (85.3 kg)  11/18/12 244 lb (110.7 kg)  11/10/12 240 lb (108.9 kg)    General: Appears her stated age, obese in NAD. Skin: Warm, dry and intact. No rashes, lesions or ulcerations noted. HEENT: Head: normal shape and size; Eyes: sclera white, no icterus, conjunctiva pink, PERRLA and EOMs intact; Neck: Neck supple, trachea midline. No masses, lumps or thyromegaly present.  Cardiovascular: Normal rate and rhythm. S1,S2 noted.  No murmur, rubs or gallops noted. No JVD or BLE edema. No carotid bruits noted. Pulmonary/Chest: Normal effort and positive vesicular breath sounds, more diminished on the right side. No respiratory distress. No wheezes, rales or ronchi noted.  Abdomen: Soft and nontender. Normal bowel sounds. No distention or masses noted. Liver, spleen and kidneys non palpable. Musculoskeletal:  Strength 5/5 BUE/BLE.  Gait slow and steady with use of walker. Neurological: Alert and oriented. Cranial nerves II-XII grossly intact. Coordination normal.  Psychiatric: Mood and affect normal. Behavior is normal. Judgment and thought content normal.     BMET    Component Value Date/Time   NA 142 10/20/2012 1245   K 4.3  10/20/2012 1245   CL 103 10/20/2012 1245   CO2 28 10/20/2012 1245   GLUCOSE 91 10/20/2012 1245   BUN 15 10/20/2012 1245   CREATININE 0.95 10/20/2012 1245   CALCIUM 9.5 10/20/2012 1245    Lipid Panel     Component Value Date/Time   CHOL 287 (H) 10/20/2012 1245   TRIG 178 (H) 10/20/2012 1245   HDL 49 10/20/2012 1245   CHOLHDL 5.9 10/20/2012 1245   VLDL 36 10/20/2012 1245   LDLCALC 202 (H) 10/20/2012 1245    CBC    Component Value Date/Time   WBC 6.7 10/20/2012 1245   RBC 4.41 10/20/2012 1245   HGB 12.9 10/20/2012 1245   HCT 37.8 10/20/2012 1245   PLT 343 10/20/2012 1245   MCV 85.7 10/20/2012 1245   MCH 29.3 10/20/2012 1245   MCHC 34.1 10/20/2012 1245   RDW 14.8 10/20/2012 1245   LYMPHSABS 1.8 10/20/2012 1245   MONOABS 0.5 10/20/2012 1245   EOSABS 0.1 10/20/2012 1245   BASOSABS 0.0 10/20/2012 1245    Hgb A1C Lab Results  Component Value Date   HGBA1C 6.0 (H) 10/20/2012      Assessment and Plan:   Medicare Annual Wellness Visit:  Diet: She does eat meat. She consumes some fruits and veggies. She tries to avoid fried foods. She drinks mostly water, Bubly, soda. Physical activity: Sedentary, using walker Depression/mood screen: Negative, PHQ 9 score of 0 Hearing: Intact to whispered voice Visual acuity: Grossly normal, performs annual eye exam  ADLs: Capable Fall risk: High, difficulty with gait, uses walker Home safety: Good Cognitive evaluation: Intact to orientation, naming, recall and repetition EOL planning: No adv directives, full code/ I agree  Preventative Medicine: Flu, tetanus, Covid, Prevnar Pneumovax and Zostavax UTD.  Advised her if she wanted to get a Shingrix vaccine to do this at the pharmacy.  Pap smear, mammogram and bone density UTD, will request this from prior PCP.  Colon screening UTD.  Encouraged her to consume a balanced diet and exercise regimen.  Advised her to see an eye doctor and dentist annually.  Due dates for screening exam given  to patient as part of her AVS.  Will check CBC, C met, TSH, free T4, lipid, A1c and hep C today   Next appointment: 6 months, follow-up chronic conditions   Webb Silversmith, NP This visit occurred during the SARS-CoV-2  public health emergency.  Safety protocols were in place, including screening questions prior to the visit, additional usage of staff PPE, and extensive cleaning of exam room while observing appropriate contact time as indicated for disinfecting solutions.

## 2020-04-26 LAB — HEPATITIS C ANTIBODY
Hepatitis C Ab: NONREACTIVE
SIGNAL TO CUT-OFF: 0 (ref <1.00)

## 2020-04-28 ENCOUNTER — Telehealth: Payer: Self-pay | Admitting: Internal Medicine

## 2020-04-28 NOTE — Chronic Care Management (AMB) (Signed)
  Chronic Care Management   Note  04/28/2020 Name: Dana Doyle MRN: [id number] DOB: 11-01-1950  [name] is a 70 y.o. year old female who is a primary care patient of Jearld Fenton, NP. I reached out to Salvadore Dom by phone today in response to a referral sent by [name] PCP, Garnette Gunner Coralie Keens, NP.   [name] about Chronic Care Management services today including:  1. CCM service includes personalized support from designated clinical staff supervised by her physician, including individualized plan of care and coordination with other care providers 2. 24/7 contact phone numbers for assistance for urgent and routine care needs. 3. Service will only be billed when office clinical staff spend 20 minutes or more in a month to coordinate care. 4. Only one practitioner may furnish and bill the service in a calendar month. 5. The patient may stop CCM services at any time (effective at the end of the month) by phone call to the office staff.   Patient agreed to services and verbal consent obtained.   Follow up plan:   [place]

## 2020-05-05 ENCOUNTER — Other Ambulatory Visit: Payer: Self-pay | Admitting: Internal Medicine

## 2020-05-12 ENCOUNTER — Encounter: Payer: Self-pay | Admitting: Internal Medicine

## 2020-05-16 DIAGNOSIS — R0902 Hypoxemia: Secondary | ICD-10-CM | POA: Diagnosis not present

## 2020-05-18 DIAGNOSIS — H25813 Combined forms of age-related cataract, bilateral: Secondary | ICD-10-CM | POA: Diagnosis not present

## 2020-06-08 ENCOUNTER — Telehealth: Payer: Self-pay

## 2020-06-08 NOTE — Chronic Care Management (AMB) (Signed)
    Chronic Care Management Pharmacy Assistant   Name: Dana Doyle  MRN: [id number] DOB: 1950-10-26  Reason for Encounter: Initial Questions for 06/12/20 appointment   Conditions to be addressed/monitored: HTN, DMII and Pulmonary Disease    Hospital visits:  None in previous 6 months  Medications: Outpatient Encounter Medications as of 06/08/2020  Medication Sig  . albuterol (VENTOLIN HFA) 108 (90 Base) MCG/ACT inhaler Inhale into the lungs every 6 (six) hours as needed for wheezing or shortness of breath.  . budesonide-formoterol (SYMBICORT) 160-4.5 MCG/ACT inhaler Inhale into the lungs.  . Cholecalciferol (VITAMIN D3) 250 MCG (10000 UT) capsule Take 10,000 Units by mouth daily.  . diazepam (VALIUM) 5 MG tablet TAKE 1 TABLET BY MOUTH DAILY AS NEEDED FOR ANXIETY.  . furosemide (LASIX) 20 MG tablet TAKE 1 TABLET BY MOUTH TWICE A DAY  . hydroxychloroquine (PLAQUENIL) 200 MG tablet Take 2 tablets by mouth daily.  Marland Kitchen levothyroxine (SYNTHROID) 50 MCG tablet TAKE 1 TABLET BY MOUTH DAILY BEFORE BREAKFAST  . metoprolol succinate (TOPROL-XL) 25 MG 24 hr tablet Take 1 tablet (25 mg total) by mouth daily.  Marland Kitchen NIFEdipine (ADALAT CC) 30 MG 24 hr tablet Take 1 tablet by mouth daily.  . ondansetron (ZOFRAN) 4 MG tablet Take 4 mg by mouth every 8 (eight) hours as needed for nausea or vomiting.  . OXYGEN Inhale into the lungs.  . pantoprazole (PROTONIX) 40 MG tablet TAKE 1 TABLET BY MOUTH EVERY DAY  . Potassium 99 MG TABS Take by mouth.  . predniSONE (DELTASONE) 1 MG tablet Take 1 mg by mouth. 2 mg EOD, 3 mg EOD  . predniSONE (DELTASONE) 10 MG tablet Take 10 mg by mouth daily with breakfast.  . riTUXimab (RITUXAN IV) Inject into the vein every 6 (six) months.  . sertraline (ZOLOFT) 100 MG tablet Take 1 tablet (100 mg total) by mouth daily.   No facility-administered encounter medications on file as of 06/08/2020.    Lab Results  Component Value Date/Time   HGBA1C 5.3 04/25/2020 11:32 AM    HGBA1C 6.0 (H) 10/20/2012 12:45 PM     BP Readings from Last 3 Encounters:  04/25/20 106/70  10/01/19 136/74  11/18/12 (!) 99/44   Multiple attempts made to reach patient. Unable to reach.   . Has patient seen any other providers since last visit with PCP? Yes  o Dr. Dory Horn- Ophthalmology  o Patient's [facility] pharmacy is:  [id number] [place] [address] [address] Phone: [phone] Fax: [phone]   Left message to ask patient to have all medications, supplements and any blood glucose and blood pressure readings available for review with Debbora Dus, Pharm. D, at her telephone visit on 06/12/20 at 2:00pm.    Star Rating Drugs:  Medication:  Last Fill: Day Supply No star drugs identified   Follow-Up:  Pharmacist Review  Debbora Dus, CPP notified  Margaretmary Dys, [place] [phone]  Total time spent for month: 25

## 2020-06-12 ENCOUNTER — Other Ambulatory Visit: Payer: Self-pay

## 2020-06-12 ENCOUNTER — Ambulatory Visit (INDEPENDENT_AMBULATORY_CARE_PROVIDER_SITE_OTHER): Payer: Medicare HMO

## 2020-06-12 ENCOUNTER — Telehealth: Payer: Self-pay

## 2020-06-12 DIAGNOSIS — I10 Essential (primary) hypertension: Secondary | ICD-10-CM

## 2020-06-12 DIAGNOSIS — E7800 Pure hypercholesterolemia, unspecified: Secondary | ICD-10-CM

## 2020-06-12 DIAGNOSIS — F419 Anxiety disorder, unspecified: Secondary | ICD-10-CM

## 2020-06-12 NOTE — Progress Notes (Signed)
Chronic Care Management Pharmacy Note  06/12/2020 Name:  Dana Doyle MRN:  [id number] DOB:  May 19, 1950  Subjective: Dana Doyle is an 70 y.o. year old female who is a primary patient of Jearld Fenton, NP.  The CCM team was consulted for assistance with disease management and care coordination needs.    Engaged with patient by telephone for initial visit in response to provider referral for pharmacy case management and/or care coordination services.   CCM Consent 04/28/20  Consent to Services:  The patient was given the following information about Chronic Care Management services today, agreed to services, and gave verbal consent: 1. CCM service includes personalized support from designated clinical staff supervised by the primary care provider, including individualized plan of care and coordination with other care providers 2. 24/7 contact phone numbers for assistance for urgent and routine care needs. 3. Service will only be billed when office clinical staff spend 20 minutes or more in a month to coordinate care. 4. Only one practitioner may furnish and bill the service in a calendar month. 5.The patient may stop CCM services at any time (effective at the end of the month) by phone call to the office staff. 6. The patient will be responsible for cost sharing (co-pay) of up to 20% of the service fee (after annual deductible is met). Patient agreed to services and consent obtained.  Patient Care Team: Jearld Fenton, NP as PCP - General (Internal Medicine) Debbora Dus, [facility] as Pharmacist (Pharmacist) Pulmonology - [place]  Recent office visits: 05/12/20 - Lab results - recommend referral to lipid clinic  04/25/20 - AWV - SLE, [facility]. Chronic respiratory failure, [facility]. Anxiety, managed on Sertraline and Valium PRN sleep. OA, on prednisone. Update labs - A1c, LDL. No medication changes.  Recent consult visits: 05/18/20 [facility] visits: None in previous 6 months  Allergies  Allergen Reactions  . Belimumab Other (See Comments) and Nausea And Vomiting  . Mycophenolate Mofetil Other (See Comments) and Nausea And Vomiting  . Phenergan [Promethazine Hcl] Anaphylaxis  . Codeine Hives, Itching and Other (See Comments)  . Meloxicam Hives  . Statins Other (See Comments)    Muscle spasm and cramps  . Ezetimibe    Objective:  Lab Results  Component Value Date   CREATININE 1.09 04/25/2020   BUN 17 04/25/2020   GFR 52.33 (L) 04/25/2020   NA 140 04/25/2020   K 3.8 04/25/2020   CALCIUM 10.1 04/25/2020   CO2 30 04/25/2020    Lab Results  Component Value Date/Time   HGBA1C 5.3 04/25/2020 11:32 AM   HGBA1C 6.0 (H) 10/20/2012 12:45 PM   GFR 52.33 (L) 04/25/2020 11:32 AM    Lab Results  Component Value Date   CHOL 308 (H) 04/25/2020   HDL 63.00 04/25/2020   LDLCALC 202 (H) 10/20/2012   LDLDIRECT 207.0 04/25/2020   TRIG 226.0 (H) 04/25/2020   CHOLHDL 5 04/25/2020   Hepatic Function Latest Ref Rng & Units 04/25/2020 10/20/2012  Total Protein 6.0 - 8.3 g/dL 6.3 6.2  Albumin 3.5 - 5.2 g/dL 4.4 4.2  AST 0 - 37 U/L 13 11  ALT 0 - 35 U/L 12 15  Alk Phosphatase 39 - 117 U/L 83 80  Total Bilirubin 0.2 - 1.2 mg/dL 0.5 0.4   Lab Results  Component Value Date/Time   TSH 4.78 (H) 04/25/2020 11:32 AM   TSH 3.63 12/15/2019 11:07 AM   FREET4 0.74 04/25/2020 11:32  AM   CBC Latest Ref Rng & Units 04/25/2020 10/20/2012  WBC 4.0 - 10.5 K/uL 6.2 6.7  Hemoglobin 12.0 - 15.0 g/dL 13.5 12.9  Hematocrit 36.0 - 46.0 % 40.0 37.8  Platelets 150.0 - 400.0 K/uL 303.0 343   Lab Results  Component Value Date/Time   VD25OH 85 10/20/2012 12:45 PM   Clinical ASCVD: No  The 10-year ASCVD risk score Mikey Bussing DC Jr., et al., 2013) is: 15%   Values used to calculate the score:     Age: 31 years     Sex: Female     Is Non-Hispanic African American: No     Diabetic: Yes     Tobacco smoker: No     Systolic Blood Pressure:  106 mmHg     Is BP treated: Yes     HDL Cholesterol: 63 mg/dL     Total Cholesterol: 308 mg/dL    Depression screen [facility] 2/9 04/25/2020  Decreased Interest 0  Down, Depressed, Hopeless 0  PHQ - 2 Score 0    Social History   Tobacco Use  Smoking Status Never Smoker  Smokeless Tobacco Never Used   BP Readings from Last 3 Encounters:  04/25/20 106/70  10/01/19 136/74  11/18/12 (!) 99/44   Pulse Readings from Last 3 Encounters:  04/25/20 67  10/01/19 88  11/18/12 73   Wt Readings from Last 3 Encounters:  04/25/20 187 lb (84.8 kg)  10/01/19 188 lb (85.3 kg)  11/18/12 244 lb (110.7 kg)    Assessment/Interventions: Review of patient past medical history, allergies, medications, health status, including review of consultants reports, laboratory and other test data, was performed as part of comprehensive evaluation and provision of chronic care management services.   SDOH:  (Social Determinants of Health) assessments and interventions performed: Yes SDOH Interventions   Flowsheet Row Most Recent Value  SDOH Interventions   Financial Strain Interventions Intervention Not Indicated  [Medications affordable]      CCM Care Plan  Allergies  Allergen Reactions  . Belimumab Other (See Comments) and Nausea And Vomiting  . Mycophenolate Mofetil Other (See Comments) and Nausea And Vomiting  . Phenergan [Promethazine Hcl] Anaphylaxis  . Codeine Hives, Itching and Other (See Comments)  . Meloxicam Hives  . Statins Other (See Comments)    Muscle spasm and cramps  . Ezetimibe     Medications Reviewed Today    Reviewed by Jearld Fenton, NP (Nurse Practitioner) on 04/25/20 at 27  Med List Status: <None>  Medication Order Taking? Sig Documenting Provider Last Dose Status Informant  albuterol (VENTOLIN HFA) 108 (90 Base) MCG/ACT inhaler [id number] Yes Inhale into the lungs every 6 (six) hours as needed for wheezing or shortness of breath. [provider] Taking Active    budesonide-formoterol (SYMBICORT) 160-4.5 MCG/ACT inhaler [id number] Yes Inhale into the lungs. [provider] Taking Active   Cholecalciferol (VITAMIN D3) 250 MCG (10000 UT) capsule [id number] Yes Take 10,000 Units by mouth daily. [provider] Taking Active   diazepam (VALIUM) 5 MG tablet [id number] Yes TAKE 1 TABLET BY MOUTH DAILY AS NEEDED FOR ANXIETY. Jearld Fenton, NP Taking Active   furosemide (LASIX) 20 MG tablet [id number] Yes Take 1 tablet (20 mg total) by mouth 2 (two) times daily. Jearld Fenton, NP Taking Active   hydroxychloroquine (PLAQUENIL) 200 MG tablet [id number] Yes Take 2 tablets by mouth daily. [provider] Taking Active   levothyroxine (SYNTHROID) 50 MCG tablet [id number] Yes TAKE 1 TABLET BY MOUTH DAILY BEFORE  Corine Shelter, NP Taking Active   metoprolol succinate (TOPROL-XL) 25 MG 24 hr tablet [id number] Yes Take 1 tablet (25 mg total) by mouth daily. Jearld Fenton, NP Taking Active   NIFEdipine (ADALAT CC) 30 MG 24 hr tablet [id number] Yes Take 1 tablet by mouth daily. [provider] Taking Active   ondansetron (ZOFRAN) 4 MG tablet [id number] Yes Take 4 mg by mouth every 8 (eight) hours as needed for nausea or vomiting. [provider] Taking Active   OXYGEN [id number] Yes Inhale into the lungs. [provider] Taking Active   pantoprazole (PROTONIX) 40 MG tablet [id number] Yes TAKE 1 TABLET BY MOUTH EVERY DAY Jearld Fenton, NP Taking Active   Potassium 99 MG TABS [id number] Yes Take by mouth. [provider] Taking Active   predniSONE (DELTASONE) 1 MG tablet [id number] Yes Take 1 mg by mouth. 2 mg EOD, 3 mg EOD [provider] Taking Active   predniSONE (DELTASONE) 10 MG tablet [id number] Yes Take 10 mg by mouth daily with breakfast. [provider] Taking Active   riTUXimab (RITUXAN IV) [id number] Yes Inject into the vein every 6 (six) months. [provider] Taking Active    sertraline (ZOLOFT) 100 MG tablet [id number] Yes Take 1 tablet (100 mg total) by mouth daily. Jearld Fenton, NP Taking Active           Patient Active Problem List   Diagnosis Date Noted  . Anxiety 10/07/2019  . GERD (gastroesophageal reflux disease) 10/07/2019  . SLE (systemic lupus erythematosus related syndrome) ([place]) 10/07/2019  . Chronic respiratory failure ([place]) 10/07/2019  . Hypercholesterolemia 10/30/2012  . DJD (degenerative joint disease) 10/20/2012  . HTN (hypertension) 10/20/2012  . DM2 (diabetes mellitus, type 2) ([place]) 10/20/2012  . Hypothyroidism 10/20/2012  . Irritable bowel syndrome 10/20/2012    Immunization History  Administered Date(s) Administered  . Influenza, High Dose Seasonal PF 01/27/2020  . PFIZER(Purple Top)SARS-COV-2 Vaccination 11/18/2019, 12/09/2019  . Pneumococcal Conjugate-13 04/29/2017  . Pneumococcal Polysaccharide-23 05/13/2013, 04/25/2016  . Tdap 02/25/2013  . Zoster 08/18/2013    Conditions to be addressed/monitored:  Hypertension, Hyperlipidemia, COPD and Anxiety  Care Plan : [place]  Updates made by Debbora Dus, [facility] since 06/12/2020 12:00 AM    Problem: Disease Management   Priority: High  Note:   Current Barriers:  . Suboptimal therapeutic regimen for sleep . Uncontrolled hyperlipidemia   Pharmacist Clinical Goal(s):  Marland Kitchen Patient will adhere to plan to optimize therapeutic regimen for insomnia as evidenced by report of adherence to recommended medication management changes . contact provider office for questions/concerns as evidenced notation of same in electronic health record through collaboration with PharmD and provider.   Interventions: . 1:1 collaboration with Jearld Fenton, NP regarding development and update of comprehensive plan of care as evidenced by provider attestation and co-signature . Inter-disciplinary care team collaboration (see longitudinal plan of care) . Comprehensive medication review  performed; medication list updated in electronic medical record  Hypertension (BP goal <140/90) -Controlled -Current treatment: . Nifedipine 30 mg - 1 tablet daily . Metoprolol succinate 25 mg - 1 tablet daily . Furosemide 20 mg - 1 tablet BID -Medications previously tried: none -Current home readings: checking twice weekly - SBP usually 125-130  --> 126/67 (Sunday) -Uses digital monitor with arm cuff  -Current exercise habits: trying to walk on treadmill, using walker less -Denies hypotensive/hypertensive symptoms -Educated on Importance of home blood pressure monitoring; Proper BP monitoring technique; -Counseled to monitor  BP at home twice weekly, document, and provide log at future appointments -Recommended to continue current medication  Familial Hyperlipidemia: (LDL goal < 70) -Uncontrolled - LDL 207 -Current treatment: . No pharmacotherapy -Medications previously tried: statins, ezetimibe   -Educated on Cholesterol goals;  PCP recommended referral to lipid clinic. Patient will let us know if she agrees to lipid clinic referral. At this time she has a lot going on with doctors appointments in the next few months. She would like more information on PSCK-9 inhibitors to discuss with her rheumatologist. -Counseled on diet and exercise extensively  Email PCSK9 information - [email or web address]. Patient to call if she would like referral to lipid clinic.   Chronic Respiratory Failure (Goal: control symptoms and prevent exacerbations) -Controlled -Current treatment  . Symbicort - 2 puffs twice daily . Albuterol - PRN . Oxygen - wears at night and PRN during day -Medications previously tried: none reported -Exacerbations requiring treatment in last 6 months: none  -Patient reports consistent use of maintenance inhaler -Frequency of rescue inhaler use: PRN - depends on the weather, maybe twice/month  -Counseled on Benefits of consistent maintenance inhaler use -Recommended to  continue current medication; Follow with [facility]  Anxiety/Insomnia (Goal: Control symptoms) -Uncontrolled -Current treatment  . Zoloft 100 mg - 1 tablet daily . Diazepam 5 mg - 1 tablet daily at bedtime PRN sleep -Medications previously tried: none -Doing well on Zoloft, keeps her at ease. Diazepam PRN sleep. Reports the diazepam does not really help. Takes it about 2 days/week or less. She wakes up throughout the night with difficulty falling back asleep. She tried melatonin before, reports ineffective. She is interested in trying an alternative PRN sleep medication.  -Recommended consultation with PCP for alternatives; Suggest trial Doxepin 10 mg qhs.   Patient Goals/Self-Care Activities . patient will:  - check blood pressure twice weekly, document, and provide at future appointments  - call if interested in referral to lipid clinic for high cholesterol    Follow Up Plan: The care management team will reach out to the patient again over the next 30 days.      Medication Assistance: None required.  Patient affirms current coverage meets needs.  Patient's preferred pharmacy is:  [id number] [place], [id number]: [phone]: [phone] Uses pill box? Yes - every Saturday  Pt endorses 100% compliance  We discussed: Benefits of medication synchronization, packaging and delivery as well as enhanced pharmacist oversight with [employer].   Patient decided to: Continue current medication management strategy  Care Plan and Follow Up Patient Decision:  Patient agrees to Care Plan and Follow-up.  MDebbora Dus PharmD Clinical Pharmacist [phone]

## 2020-06-12 NOTE — Telephone Encounter (Signed)
Patient reports diazepam is no longer effective for sleep. Reports waking up throughout the night with difficulty falling back asleep. She is taking the diazepam PRN maybe 1-2 nights per week. We discussed alternatives and she is open to changing therapy. Suggest trial of doxepin 10 mg qhs PRN pending discussion with PCP. Patient aware I will call her with an update following PCP recommendations.   Dana Doyle, PharmD Clinical Pharmacist [place] [facility] [phone]

## 2020-06-12 NOTE — Patient Instructions (Signed)
June 12, 2020  Dear Dana Doyle,  It was a pleasure meeting you during our initial appointment on June 12, 2020. Below is a summary of the goals we discussed and components of chronic care management. Please contact me anytime with questions or concerns.   Visit Information  Patient Care Plan: CCM Pharmacy Care Plan    Problem Identified: Disease Management   Priority: High  Note:   Current Barriers:  . Suboptimal therapeutic regimen for sleep . Uncontrolled hyperlipidemia   Pharmacist Clinical Goal(s):  Marland Kitchen Patient will adhere to plan to optimize therapeutic regimen for insomnia as evidenced by report of adherence to recommended medication management changes . contact provider office for questions/concerns as evidenced notation of same in electronic health record through collaboration with PharmD and provider.   Interventions: . 1:1 collaboration with Jearld Fenton, NP regarding development and update of comprehensive plan of care as evidenced by provider attestation and co-signature . Inter-disciplinary care team collaboration (see longitudinal plan of care) . Comprehensive medication review performed; medication list updated in electronic medical record  Hypertension (BP goal <140/90) -Controlled -Current treatment: . Nifedipine 30 mg - 1 tablet daily . Metoprolol succinate 25 mg - 1 tablet daily . Furosemide 20 mg - 1 tablet BID -Medications previously tried: none -Current home readings: checking twice weekly - SBP usually 125-130  --> 126/67 (Sunday) -Uses digital monitor with arm cuff  -Current exercise habits: trying to walk on treadmill, using walker less -Denies hypotensive/hypertensive symptoms -Educated on Importance of home blood pressure monitoring; Proper BP monitoring technique; -Counseled to monitor BP at home twice weekly, document, and provide log at future appointments -Recommended to continue current medication  Familial Hyperlipidemia: (LDL goal  < 70) -Uncontrolled - LDL 207 -Current treatment: . No pharmacotherapy -Medications previously tried: statins, ezetimibe   -Educated on Cholesterol goals;  PCP recommended referral to lipid clinic. Patient will let us know if she agrees to lipid clinic referral. At this time she has a lot going on with doctors appointments in the next few months. She would like more information on PSCK-9 inhibitors to discuss with her rheumatologist. -Counseled on diet and exercise extensively  Email PCSK9 information - [email or web address]. Patient to call if she would like referral to lipid clinic.   Chronic Respiratory Failure (Goal: control symptoms and prevent exacerbations) -Controlled -Current treatment  . Symbicort - 2 puffs twice daily . Albuterol - PRN . Oxygen - wears at night and PRN during day -Medications previously tried: none reported -Exacerbations requiring treatment in last 6 months: none  -Patient reports consistent use of maintenance inhaler -Frequency of rescue inhaler use: PRN - depends on the weather, maybe twice/month  -Counseled on Benefits of consistent maintenance inhaler use -Recommended to continue current medication; Follow with [facility] pulmonology  Anxiety/Insomnia (Goal: Control symptoms) -Uncontrolled -Current treatment  . Zoloft 100 mg - 1 tablet daily . Diazepam 5 mg - 1 tablet daily at bedtime PRN sleep -Medications previously tried: none -Doing well on Zoloft, keeps her at ease. Diazepam PRN sleep. Reports the diazepam does not really help. Takes it about 2 days/week or less. She wakes up throughout the night with difficulty falling back asleep. She tried melatonin before, reports ineffective. She is interested in trying an alternative PRN sleep medication.  -Recommended consultation with PCP for alternatives; Suggest trial Doxepin 10 mg qhs.   Patient Goals/Self-Care Activities . patient will:  - check blood pressure twice weekly, document, and provide at future  appointments  - call if  interested in referral to lipid clinic for high cholesterol    Follow Up Plan: The care management team will reach out to the patient again over the next 30 days.      Dana Doyle was given information about Chronic Care Management services today including:  1. CCM service includes personalized support from designated clinical staff supervised by her physician, including individualized plan of care and coordination with other care providers 2. 24/7 contact phone numbers for assistance for urgent and routine care needs. 3. Standard insurance, coinsurance, copays and deductibles apply for chronic care management only during months in which we provide at least 20 minutes of these services. Most insurances cover these services at 100%, however patients may be responsible for any copay, coinsurance and/or deductible if applicable. This service may help you avoid the need for more expensive face-to-face services. 4. Only one practitioner may furnish and bill the service in a calendar month. 5. The patient may stop CCM services at any time (effective at the end of the month) by phone call to the office staff.  Patient agreed to services and verbal consent obtained.   The patient verbalized understanding of instructions, educational materials, and care plan provided today and agreed to receive a mailed copy of patient instructions, educational materials, and care plan.  The pharmacy team will reach out to the patient again over the next 30 days.   Debbora Dus, PharmD Clinical Pharmacist [facility] at [facility] [phone]   Insomnia Insomnia is a sleep disorder that makes it difficult to fall asleep or stay asleep. Insomnia can cause fatigue, low energy, difficulty concentrating, mood swings, and poor performance at work or school. There are three different ways to classify insomnia:  Difficulty falling asleep.  Difficulty staying asleep.  Waking up  too early in the morning. Any type of insomnia can be long-term (chronic) or short-term (acute). Both are common. Short-term insomnia usually lasts for three months or less. Chronic insomnia occurs at least three times a week for longer than three months. What are the causes? Insomnia may be caused by another condition, situation, or substance, such as:  Anxiety.  Certain medicines.  Gastroesophageal reflux disease (GERD) or other gastrointestinal conditions.  Asthma or other breathing conditions.  Restless legs syndrome, sleep apnea, or other sleep disorders.  Chronic pain.  Menopause.  Stroke.  Abuse of alcohol, tobacco, or illegal drugs.  Mental health conditions, such as depression.  Caffeine.  Neurological disorders, such as Alzheimer's disease.  An overactive thyroid (hyperthyroidism). Sometimes, the cause of insomnia may not be known. What increases the risk? Risk factors for insomnia include:  Gender. Women are affected more often than men.  Age. Insomnia is more common as you get older.  Stress.  Lack of exercise.  Irregular work schedule or working night shifts.  Traveling between different time zones.  Certain medical and mental health conditions. What are the signs or symptoms? If you have insomnia, the main symptom is having trouble falling asleep or having trouble staying asleep. This may lead to other symptoms, such as:  Feeling fatigued or having low energy.  Feeling nervous about going to sleep.  Not feeling rested in the morning.  Having trouble concentrating.  Feeling irritable, anxious, or depressed. How is this diagnosed? This condition may be diagnosed based on:  Your symptoms and medical history. Your health care provider may ask about: ? Your sleep habits. ? Any medical conditions you have. ? Your mental health.  A physical exam.  How is this treated? Treatment for insomnia depends on the cause. Treatment may focus on  treating an underlying condition that is causing insomnia. Treatment may also include:  Medicines to help you sleep.  Counseling or therapy.  Lifestyle adjustments to help you sleep better. Follow these instructions at home: Eating and drinking  Limit or avoid alcohol, caffeinated beverages, and cigarettes, especially close to bedtime. These can disrupt your sleep.  Do not eat a large meal or eat spicy foods right before bedtime. This can lead to digestive discomfort that can make it hard for you to sleep.   Sleep habits  Keep a sleep diary to help you and your health care provider figure out what could be causing your insomnia. Write down: ? When you sleep. ? When you wake up during the night. ? How well you sleep. ? How rested you feel the next day. ? Any side effects of medicines you are taking. ? What you eat and drink.  Make your bedroom a dark, comfortable place where it is easy to fall asleep. ? Put up shades or blackout curtains to block light from outside. ? Use a white noise machine to block noise. ? Keep the temperature cool.  Limit screen use before bedtime. This includes: ? Watching TV. ? Using your smartphone, tablet, or computer.  Stick to a routine that includes going to bed and waking up at the same times every day and night. This can help you fall asleep faster. Consider making a quiet activity, such as reading, part of your nighttime routine.  Try to avoid taking naps during the day so that you sleep better at night.  Get out of bed if you are still awake after 15 minutes of trying to sleep. Keep the lights down, but try reading or doing a quiet activity. When you feel sleepy, go back to bed.   General instructions  Take over-the-counter and prescription medicines only as told by your health care provider.  Exercise regularly, as told by your health care provider. Avoid exercise starting several hours before bedtime.  Use relaxation techniques to manage  stress. Ask your health care provider to suggest some techniques that may work well for you. These may include: ? Breathing exercises. ? Routines to release muscle tension. ? Visualizing peaceful scenes.  Make sure that you drive carefully. Avoid driving if you feel very sleepy.  Keep all follow-up visits as told by your health care provider. This is important. Contact a health care provider if:  You are tired throughout the day.  You have trouble in your daily routine due to sleepiness.  You continue to have sleep problems, or your sleep problems get worse. Get help right away if:  You have serious thoughts about hurting yourself or someone else. If you ever feel like you may hurt yourself or others, or have thoughts about taking your own life, get help right away. You can go to your nearest emergency department or call:  Your local emergency services (911 in the [place]  A suicide crisis helpline, such as the [facility] at 502-197-2253. This is open 24 hours a day. Summary  Insomnia is a sleep disorder that makes it difficult to fall asleep or stay asleep.  Insomnia can be long-term (chronic) or short-term (acute).  Treatment for insomnia depends on the cause. Treatment may focus on treating an underlying condition that is causing insomnia.  Keep a sleep diary to help you and your health care provider figure  out what could be causing your insomnia. This information is not intended to replace advice given to you by your health care provider. Make sure you discuss any questions you have with your health care provider. Document Revised: 01/27/2020 Document Reviewed: 01/27/2020 Elsevier Patient Education  2021 [employer].

## 2020-06-13 DIAGNOSIS — R0902 Hypoxemia: Secondary | ICD-10-CM | POA: Diagnosis not present

## 2020-06-13 NOTE — Progress Notes (Signed)
I have personally reviewed this encounter including the documentation in this note and have collaborated with the care management provider regarding care management and care coordination activities to include development and update of the comprehensive care plan. I am certifying that I agree with the content of this note and encounter as supervising physician.  Kyran Connaughton, NP   

## 2020-06-13 NOTE — Telephone Encounter (Signed)
I was made aware no providers are accepting transfers at this time. Let patient know that she could schedule appt with Rollene Fare at new clinic if desired. She plans to discuss sleep concerns with rheumatology. Follow up with CCM in 6 weeks.  Debbora Dus, PharmD Clinical Pharmacist [facility] at [facility] [phone]

## 2020-06-13 NOTE — Telephone Encounter (Addendum)
Understandable, I will let her know and see if she would like to go ahead and establish with a new provider. I know she wanted to stay here since its conveniently located.  Thanks and best wishes!  Debbora Dus, PharmD Clinical Pharmacist [facility] at [facility] [phone]

## 2020-06-13 NOTE — Telephone Encounter (Signed)
I have not used Doxepin frequently and with me leaving the practice in 1 month, I would want to hold off on changing therapy at this time. She can follow up with me at [facility] or she can discuss with her new provider once she finds one.

## 2020-06-15 DIAGNOSIS — Z79899 Other long term (current) drug therapy: Secondary | ICD-10-CM | POA: Diagnosis not present

## 2020-06-15 DIAGNOSIS — M8589 Other specified disorders of bone density and structure, multiple sites: Secondary | ICD-10-CM | POA: Diagnosis not present

## 2020-06-26 ENCOUNTER — Other Ambulatory Visit: Payer: Self-pay | Admitting: Internal Medicine

## 2020-06-26 DIAGNOSIS — E039 Hypothyroidism, unspecified: Secondary | ICD-10-CM

## 2020-07-03 DIAGNOSIS — M47812 Spondylosis without myelopathy or radiculopathy, cervical region: Secondary | ICD-10-CM | POA: Diagnosis not present

## 2020-07-03 DIAGNOSIS — J849 Interstitial pulmonary disease, unspecified: Secondary | ICD-10-CM | POA: Diagnosis not present

## 2020-07-03 DIAGNOSIS — Z79899 Other long term (current) drug therapy: Secondary | ICD-10-CM | POA: Diagnosis not present

## 2020-07-03 DIAGNOSIS — M329 Systemic lupus erythematosus, unspecified: Secondary | ICD-10-CM | POA: Diagnosis not present

## 2020-07-04 ENCOUNTER — Other Ambulatory Visit: Payer: Self-pay | Admitting: Internal Medicine

## 2020-07-05 DIAGNOSIS — E6609 Other obesity due to excess calories: Secondary | ICD-10-CM | POA: Diagnosis not present

## 2020-07-05 DIAGNOSIS — M320 Drug-induced systemic lupus erythematosus: Secondary | ICD-10-CM | POA: Diagnosis not present

## 2020-07-05 DIAGNOSIS — J849 Interstitial pulmonary disease, unspecified: Secondary | ICD-10-CM | POA: Diagnosis not present

## 2020-07-05 DIAGNOSIS — Z01818 Encounter for other preprocedural examination: Secondary | ICD-10-CM | POA: Diagnosis not present

## 2020-07-05 DIAGNOSIS — E032 Hypothyroidism due to medicaments and other exogenous substances: Secondary | ICD-10-CM | POA: Diagnosis not present

## 2020-07-05 DIAGNOSIS — Z6833 Body mass index (BMI) 33.0-33.9, adult: Secondary | ICD-10-CM | POA: Diagnosis not present

## 2020-07-05 DIAGNOSIS — H269 Unspecified cataract: Secondary | ICD-10-CM | POA: Diagnosis not present

## 2020-07-05 DIAGNOSIS — K2100 Gastro-esophageal reflux disease with esophagitis, without bleeding: Secondary | ICD-10-CM | POA: Diagnosis not present

## 2020-07-05 DIAGNOSIS — I10 Essential (primary) hypertension: Secondary | ICD-10-CM | POA: Diagnosis not present

## 2020-07-12 DIAGNOSIS — I10 Essential (primary) hypertension: Secondary | ICD-10-CM | POA: Diagnosis not present

## 2020-07-12 DIAGNOSIS — H2512 Age-related nuclear cataract, left eye: Secondary | ICD-10-CM | POA: Diagnosis not present

## 2020-07-14 DIAGNOSIS — R0902 Hypoxemia: Secondary | ICD-10-CM | POA: Diagnosis not present

## 2020-07-15 ENCOUNTER — Other Ambulatory Visit: Payer: Self-pay | Admitting: Internal Medicine

## 2020-07-18 ENCOUNTER — Telehealth: Payer: Self-pay

## 2020-07-18 NOTE — Chronic Care Management (AMB) (Addendum)
    Chronic Care Management Pharmacy Assistant   Name: Dana Doyle  MRN: [id number] DOB: 11/21/1950  Reason for Encounter: Reminder for CCM Appointment on 07/24/20    Medications: Outpatient Encounter Medications as of 07/18/2020  Medication Sig   albuterol (VENTOLIN HFA) 108 (90 Base) MCG/ACT inhaler Inhale into the lungs every 6 (six) hours as needed for wheezing or shortness of breath.   budesonide-formoterol (SYMBICORT) 160-4.5 MCG/ACT inhaler Inhale into the lungs.   Cholecalciferol (VITAMIN D3) 250 MCG (10000 UT) capsule Take 10,000 Units by mouth daily. 5,000   diazepam (VALIUM) 5 MG tablet TAKE 1 TABLET BY MOUTH DAILY AS NEEDED FOR ANXIETY.   furosemide (LASIX) 20 MG tablet TAKE 1 TABLET BY MOUTH TWICE A DAY   hydroxychloroquine (PLAQUENIL) 200 MG tablet Take 2 tablets by mouth daily.   levothyroxine (SYNTHROID) 50 MCG tablet TAKE 1 TABLET BY MOUTH EVERY DAY BEFORE BREAKFAST   metoprolol succinate (TOPROL-XL) 25 MG 24 hr tablet TAKE 1 TABLET (25 MG TOTAL) BY MOUTH DAILY.   Multiple Vitamins-Minerals (WOMENS MULTIVITAMIN PO) Take by mouth.   NIFEdipine (ADALAT CC) 30 MG 24 hr tablet Take 1 tablet by mouth daily.   ondansetron (ZOFRAN) 4 MG tablet Take 4 mg by mouth every 8 (eight) hours as needed for nausea or vomiting.   OXYGEN Inhale into the lungs.   pantoprazole (PROTONIX) 40 MG tablet TAKE 1 TABLET BY MOUTH EVERY DAY   Potassium 99 MG TABS Take by mouth.   predniSONE (DELTASONE) 1 MG tablet Take 3 mg by mouth daily with breakfast.   riTUXimab (RITUXAN IV) Inject into the vein every 6 (six) months.   sertraline (ZOLOFT) 100 MG tablet TAKE 1 TABLET BY MOUTH EVERY DAY   sulfamethoxazole-trimethoprim (BACTRIM DS) 800-160 MG tablet Take 1 tablet by mouth 3 (three) times a week.   No facility-administered encounter medications on file as of 07/18/2020.   Dana Doyle was contacted to remind her of her upcoming telephone visit with Debbora Dus on 07/24/20 at 9:30.  Left  message to have all medications, supplements and any blood glucose and blood pressure readings available for review at appointment.  Star Rating Drugs: Medication:  Last Fill: Day Supply No Star rating drugs identified   Debbora Dus, CPP notified  Dana Doyle, [place] [phone]  I have reviewed the care management and care coordination activities outlined in this encounter and I am certifying that I agree with the content of this note. No further action required.  Debbora Dus, PharmD Clinical Pharmacist [facility] at [facility] [phone]

## 2020-07-19 DIAGNOSIS — M329 Systemic lupus erythematosus, unspecified: Secondary | ICD-10-CM | POA: Diagnosis not present

## 2020-07-24 ENCOUNTER — Telehealth: Payer: Self-pay

## 2020-07-24 ENCOUNTER — Other Ambulatory Visit: Payer: Self-pay

## 2020-07-24 NOTE — Progress Notes (Signed)
Note entered in error

## 2020-07-24 NOTE — Telephone Encounter (Signed)
Chronic Care Management Pharmacy Note  07/24/2020 Name:  Dana Doyle MRN:  [id number] DOB:  1950/07/29  Subjective: Dana Doyle is an 70 y.o. year old female who is a primary patient of Dana Fenton, NP.  The CCM team was consulted for assistance with disease management and care coordination needs.    Engaged with patient by telephone for follow up visit in response to provider referral for pharmacy case management and/or care coordination services.   Consent to Services:  The patient was given information about Chronic Care Management services, agreed to services, and gave verbal consent prior to initiation of services.  Please see initial visit note for detailed documentation.   Patient Care Team: Dana Fenton, NP as PCP - General (Internal Medicine) Dana Doyle, [facility] as Pharmacist (Pharmacist) Pulmonology - [place]   Recent office visits: 05/12/20 - PCP Lab results - recommend referral to lipid clinic  04/25/20 - PCP/AWV - SLE, [facility]. Chronic respiratory failure, [facility]. Anxiety, managed on Sertraline and Valium PRN sleep. OA, on prednisone. Update labs - A1c, LDL. No medication changes.  Recent consult visits: 07/19/20 - SLE Infusion therapy 07/05/20 - General surgery - cataracts both eyes  07/03/20 - Rheumatology - Continue current medications, RTC 6 months 05/18/20 [facility] visits: None in previous 6 months  Allergies  Allergen Reactions  . Belimumab Other (See Comments) and Nausea And Vomiting  . Mycophenolate Mofetil Other (See Comments) and Nausea And Vomiting  . Phenergan [Promethazine Hcl] Anaphylaxis  . Codeine Hives, Itching and Other (See Comments)  . Meloxicam Hives  . Statins Other (See Comments)    Muscle spasm and cramps  . Ezetimibe    Objective:  Lab Results  Component Value Date   CREATININE 1.09 04/25/2020   BUN 17 04/25/2020   GFR 52.33 (L) 04/25/2020   NA 140 04/25/2020   K 3.8  04/25/2020   CALCIUM 10.1 04/25/2020   CO2 30 04/25/2020    Lab Results  Component Value Date/Time   HGBA1C 5.3 04/25/2020 11:32 AM   HGBA1C 6.0 (H) 10/20/2012 12:45 PM   GFR 52.33 (L) 04/25/2020 11:32 AM    Lab Results  Component Value Date   CHOL 308 (H) 04/25/2020   HDL 63.00 04/25/2020   LDLCALC 202 (H) 10/20/2012   LDLDIRECT 207.0 04/25/2020   TRIG 226.0 (H) 04/25/2020   CHOLHDL 5 04/25/2020   Hepatic Function Latest Ref Rng & Units 04/25/2020 10/20/2012  Total Protein 6.0 - 8.3 g/dL 6.3 6.2  Albumin 3.5 - 5.2 g/dL 4.4 4.2  AST 0 - 37 U/L 13 11  ALT 0 - 35 U/L 12 15  Alk Phosphatase 39 - 117 U/L 83 80  Total Bilirubin 0.2 - 1.2 mg/dL 0.5 0.4   Lab Results  Component Value Date/Time   TSH 4.78 (H) 04/25/2020 11:32 AM   TSH 3.63 12/15/2019 11:07 AM   FREET4 0.74 04/25/2020 11:32 AM   CBC Latest Ref Rng & Units 04/25/2020 10/20/2012  WBC 4.0 - 10.5 K/uL 6.2 6.7  Hemoglobin 12.0 - 15.0 g/dL 13.5 12.9  Hematocrit 36.0 - 46.0 % 40.0 37.8  Platelets 150.0 - 400.0 K/uL 303.0 343   Lab Results  Component Value Date/Time   VD25OH 85 10/20/2012 12:45 PM   Clinical ASCVD: No  The 10-year ASCVD risk score Dana Bussing DC Jr., et al., 2013) is: 15.6%   Values used to calculate the score:     Age: 86 years  Sex: Female     Is Non-Hispanic African American: No     Diabetic: Yes     Tobacco smoker: No     Systolic Blood Pressure: 765 mmHg     Is BP treated: Yes     HDL Cholesterol: 63 mg/dL     Total Cholesterol: 308 mg/dL    Depression screen [facility] 2/9 04/25/2020  Decreased Interest 0  Down, Depressed, Hopeless 0  PHQ - 2 Score 0    Social History   Tobacco Use  Smoking Status Never Smoker  Smokeless Tobacco Never Used   BP Readings from Last 3 Encounters:  04/25/20 106/70  10/01/19 136/74  11/18/12 (!) 99/44   Pulse Readings from Last 3 Encounters:  04/25/20 67  10/01/19 88  11/18/12 73   Wt Readings from Last 3 Encounters:  04/25/20 187 lb (84.8 kg)   10/01/19 188 lb (85.3 kg)  11/18/12 244 lb (110.7 kg)    Assessment/Interventions: Review of patient past medical history, allergies, medications, health status, including review of consultants reports, laboratory and other test data, was performed as part of comprehensive evaluation and provision of chronic care management services.   SDOH:  (Social Determinants of Health) assessments and interventions performed: Yes SDOH Interventions   Flowsheet Row Most Recent Value  SDOH Interventions   Financial Strain Interventions Intervention Not Indicated  [medications affordable]      CCM Care Plan  Allergies  Allergen Reactions  . Belimumab Other (See Comments) and Nausea And Vomiting  . Mycophenolate Mofetil Other (See Comments) and Nausea And Vomiting  . Phenergan [Promethazine Hcl] Anaphylaxis  . Codeine Hives, Itching and Other (See Comments)  . Meloxicam Hives  . Statins Other (See Comments)    Muscle spasm and cramps  . Ezetimibe     Medications Reviewed Today    Reviewed by Dana Doyle, [facility] (Pharmacist) on 07/24/20 at [place] List Status: <None>  Medication Order Taking? Sig Documenting Provider Last Dose Status Informant  albuterol (VENTOLIN HFA) 108 (90 Base) MCG/ACT inhaler [id number] Yes Inhale into the lungs every 6 (six) hours as needed for wheezing or shortness of breath. [provider] Taking Active   budesonide-formoterol (SYMBICORT) 160-4.5 MCG/ACT inhaler [id number] Yes Inhale into the lungs. [provider] Taking Active   Cholecalciferol (VITAMIN D3) 250 MCG (10000 UT) capsule [id number] Yes Take 10,000 Units by mouth daily. 5,000 [provider] Taking Active Self  diazepam (VALIUM) 5 MG tablet [id number] Yes TAKE 1 TABLET BY MOUTH DAILY AS NEEDED FOR ANXIETY. Dana Fenton, NP Taking Active   furosemide (LASIX) 20 MG tablet [id number] Yes TAKE 1 TABLET BY MOUTH TWICE A DAY Baity, Coralie Keens, NP Taking Active   hydroxychloroquine  (PLAQUENIL) 200 MG tablet [id number]  Take 2 tablets by mouth daily. [provider]  Active   levothyroxine (SYNTHROID) 50 MCG tablet [id number] Yes TAKE 1 TABLET BY MOUTH EVERY DAY BEFORE BREAKFAST Baity, Coralie Keens, NP Taking Active   metoprolol succinate (TOPROL-XL) 25 MG 24 hr tablet [id number] Yes TAKE 1 TABLET (25 MG TOTAL) BY MOUTH DAILY. Dana Fenton, NP Taking Active   Multiple Vitamins-Minerals (WOMENS MULTIVITAMIN PO) [id number] Yes Take by mouth. [provider] Taking Active   NIFEdipine (ADALAT CC) 30 MG 24 hr tablet [id number] Yes Take 1 tablet by mouth daily. [provider] Taking Active   ondansetron (ZOFRAN) 4 MG tablet [id number]  Take 4 mg by mouth every 8 (eight) hours as needed for nausea or vomiting. [provider]  Active   OXYGEN [id number]  Inhale into the lungs. [provider]  Active   pantoprazole (PROTONIX) 40 MG tablet [id number]  TAKE 1 TABLET BY MOUTH EVERY DAY Dana Fenton, NP  Active   Potassium 99 MG TABS [id number]  Take by mouth. [provider]  Active   predniSONE (DELTASONE) 1 MG tablet [id number]  Take 3 mg by mouth daily with breakfast. [provider]  Active Self  riTUXimab (RITUXAN IV) [id number]  Inject into the vein every 6 (six) months. [provider]  Active   sertraline (ZOLOFT) 100 MG tablet [id number] Yes TAKE 1 TABLET BY MOUTH EVERY DAY Baity, Coralie Keens, NP Taking Active   sulfamethoxazole-trimethoprim (BACTRIM DS) 800-160 MG tablet [id number]  Take 1 tablet by mouth 3 (three) times a week. [provider]  Active Self          Patient Active Problem List   Diagnosis Date Noted  . Anxiety 10/07/2019  . GERD (gastroesophageal reflux disease) 10/07/2019  . SLE (systemic lupus erythematosus related syndrome) ([place]) 10/07/2019  . Chronic respiratory failure ([place]) 10/07/2019  . Hypercholesterolemia 10/30/2012  . DJD (degenerative joint disease) 10/20/2012  . HTN  (hypertension) 10/20/2012  . DM2 (diabetes mellitus, type 2) ([place]) 10/20/2012  . Hypothyroidism 10/20/2012  . Irritable bowel syndrome 10/20/2012    Immunization History  Administered Date(s) Administered  . Influenza, High Dose Seasonal PF 01/27/2020  . PFIZER(Purple Top)SARS-COV-2 Vaccination 11/18/2019, 12/09/2019  . Pneumococcal Conjugate-13 04/29/2017  . Pneumococcal Polysaccharide-23 05/13/2013, 04/25/2016  . Tdap 02/25/2013  . Zoster 08/18/2013    Conditions to be addressed/monitored:  Hypertension, Hyperlipidemia, COPD and Anxiety  Care Plan : [place]  Updates made by Dana Doyle, [facility] since 07/24/2020 12:00 AM    Problem: Disease Management   Priority: High  Note:    Current Barriers:  . Suboptimal therapeutic regimen for sleep . Uncontrolled hyperlipidemia  . Not yet established with new PCP   Pharmacist Clinical Goal(s):  Marland Kitchen Patient will contact provider office for questions/concerns as evidenced notation of same in electronic health record through collaboration with PharmD and provider.   Interventions: . 1:1 collaboration with Dana Fenton, NP regarding development and update of comprehensive plan of care as evidenced by provider attestation and co-signature . Inter-disciplinary care team collaboration (see longitudinal plan of care) . Comprehensive medication review performed; medication list updated in electronic medical record  Hypertension (BP goal <140/90) -Controlled - clinic and home readings within goal  -Current treatment: . Nifedipine 30 mg - 1 tablet daily . Metoprolol succinate 25 mg - 1 tablet daily . Furosemide 20 mg - 1 tablet BID -Medications previously tried: none -Current home readings: none reported today -Uses digital monitor with arm cuff  -Current exercise habits: walks on treadmill on occasion  -Denies hypotensive/hypertensive symptoms -Counseled to monitor BP at home twice weekly, document, and provide log at  future appointments -Reviewed refill history, all up to date except no history for nifedipine. Pt confirms adherence.  -Recommended to continue current medication  Familial Hyperlipidemia: (LDL goal < 70) -Uncontrolled - LDL 207 -Current treatment: . No pharmacotherapy -Medications previously tried: statins, ezetimibe   -Educated on Cholesterol goals;  PCP recommended referral to lipid clinic. Discussed this at last visit and patient wanted to discuss this with rheum. Patient received email regarding PCSK-9 inhibitor information. She did not discuss with rheumatologist as planned. We discussed revisiting this issue once established with new PCP. At this time she has a  lot going on with doctors appointments in the next few months.  -Patient to call if she would like to proceed with referral for lipid clinic.   Chronic Respiratory Failure (Goal: control symptoms and prevent exacerbations) -Controlled Followed by pulmonology -Current treatment  . Symbicort - 2 puffs twice daily . Albuterol - PRN . Oxygen - wears at night and PRN during day -Medications previously tried: none reported -Exacerbations requiring treatment in last 6 months: none  -Patient reports consistent use of maintenance inhaler -Frequency of rescue inhaler use: depends on the weather, maybe twice/month  -Recommended to continue current medication; Follow with [facility]  Anxiety/Insomnia (Goal: Control symptoms) -Uncontrolled -Current treatment  . Zoloft 100 mg - 1 tablet daily . Diazepam 5 mg - 1 tablet daily at bedtime PRN sleep -Medications previously tried: none -Doing well on Zoloft, keeps her at ease. Diazepam PRN sleep. Reports the diazepam does not really help. Takes it about 2 days/week or less. She wakes up throughout the night with difficulty falling back asleep. She tried melatonin before, reports ineffective. She is interested in trying an alternative PRN sleep medication.  -Update 07/24/20 - Patient  still doing okay, not ideal. She will discuss sleep alternatives once established with new PCP. -Recommended continue current medications  Patient Goals/Self-Care Activities . patient will:  - check blood pressure twice weekly, document, and provide at future appointments  - call if interested in referral to lipid clinic for high cholesterol    Follow Up Plan: The care management team will reach out to the patient again over the next 90 days.       Medication Assistance: None required.  Patient affirms current coverage meets needs.  Patient's preferred pharmacy is:  [id number] [place] [id number]: [phone]: [phone] Uses pill box? Yes - every Saturday  Pt endorses 100% compliance  We discussed: Benefits of medication synchronization, packaging and delivery as well as enhanced pharmacist oversight with [employer].   Patient decided to: Continue current medication management strategy  Care Plan and Follow Up Patient Decision:  Patient agrees to Care Plan and Follow-up.  MDebbora Doyle PharmD Clinical Pharmacist [phone]

## 2020-07-24 NOTE — Patient Instructions (Signed)
Dear Dana Doyle,  Below is a summary of the goals we discussed during our follow up appointment on July 24, 2020. Please contact me anytime with questions or concerns.   Visit Information  Patient Care Plan: CCM Pharmacy Care Plan    Problem Identified: Disease Management   Priority: High  Note:    Current Barriers:  . Suboptimal therapeutic regimen for sleep . Uncontrolled hyperlipidemia  . Not yet established with new PCP   Pharmacist Clinical Goal(s):  Marland Kitchen Patient will contact provider office for questions/concerns as evidenced notation of same in electronic health record through collaboration with PharmD and provider.   Interventions: . 1:1 collaboration with Jearld Fenton, NP regarding development and update of comprehensive plan of care as evidenced by provider attestation and co-signature . Inter-disciplinary care team collaboration (see longitudinal plan of care) . Comprehensive medication review performed; medication list updated in electronic medical record  Hypertension (BP goal <140/90) -Controlled - clinic and home readings within goal  -Current treatment: . Nifedipine 30 mg - 1 tablet daily . Metoprolol succinate 25 mg - 1 tablet daily . Furosemide 20 mg - 1 tablet BID -Medications previously tried: none -Current home readings: none reported today -Uses digital monitor with arm cuff  -Current exercise habits: walks on treadmill on occasion  -Denies hypotensive/hypertensive symptoms -Counseled to monitor BP at home twice weekly, document, and provide log at future appointments -Reviewed refill history, all up to date except no history for nifedipine. Pt confirms adherence.  -Recommended to continue current medication  Familial Hyperlipidemia: (LDL goal < 70) -Uncontrolled - LDL 207 -Current treatment: . No pharmacotherapy -Medications previously tried: statins, ezetimibe   -Educated on Cholesterol goals;  PCP recommended referral to lipid clinic.  Discussed this at last visit and patient wanted to discuss this with rheum. Patient received email regarding PCSK-9 inhibitor information. She did not discuss with rheumatologist as planned. We discussed revisiting this issue once established with new PCP. At this time she has a lot going on with doctors appointments in the next few months.  -Patient to call if she would like to proceed with referral for lipid clinic.   Chronic Respiratory Failure (Goal: control symptoms and prevent exacerbations) -Controlled Followed by pulmonology -Current treatment  . Symbicort - 2 puffs twice daily . Albuterol - PRN . Oxygen - wears at night and PRN during day -Medications previously tried: none reported -Exacerbations requiring treatment in last 6 months: none  -Patient reports consistent use of maintenance inhaler -Frequency of rescue inhaler use: depends on the weather, maybe twice/month  -Recommended to continue current medication; Follow with [facility]  Anxiety/Insomnia (Goal: Control symptoms) -Uncontrolled -Current treatment  . Zoloft 100 mg - 1 tablet daily . Diazepam 5 mg - 1 tablet daily at bedtime PRN sleep -Medications previously tried: none -Doing well on Zoloft, keeps her at ease. Diazepam PRN sleep. Reports the diazepam does not really help. Takes it about 2 days/week or less. She wakes up throughout the night with difficulty falling back asleep. She tried melatonin before, reports ineffective. She is interested in trying an alternative PRN sleep medication.  -Update 07/24/20 - Patient still doing okay, not ideal. She will discuss sleep alternatives once established with new PCP. -Recommended continue current medications  Patient Goals/Self-Care Activities . patient will:  - check blood pressure twice weekly, document, and provide at future appointments  - call if interested in referral to lipid clinic for high cholesterol    Follow Up Plan: The care management  team will reach  out to the patient again over the next 90 days.        The patient verbalized understanding of instructions, educational materials, and care plan provided today and agreed to receive a mailed copy of patient instructions, educational materials, and care plan.   Debbora Dus, PharmD Clinical Pharmacist [facility] [phone]   High Cholesterol  High cholesterol is a condition in which the blood has high levels of a white, waxy substance similar to fat (cholesterol). The liver makes all the cholesterol that the body needs. The human body needs small amounts of cholesterol to help build cells. A person gets extra or excess cholesterol from the food that he or she eats. The blood carries cholesterol from the liver to the rest of the body. If you have high cholesterol, deposits (plaques) may build up on the walls of your arteries. Arteries are the blood vessels that carry blood away from your heart. These plaques make the arteries narrow and stiff. Cholesterol plaques increase your risk for heart attack and stroke. Work with your health care provider to keep your cholesterol levels in a healthy range. What increases the risk? The following factors may make you more likely to develop this condition:  Eating foods that are high in animal fat (saturated fat) or cholesterol.  Being overweight.  Not getting enough exercise.  A family history of high cholesterol (familial hypercholesterolemia).  Use of tobacco products.  Having diabetes. What are the signs or symptoms? There are no symptoms of this condition. How is this diagnosed? This condition may be diagnosed based on the results of a blood test.  If you are older than 69 years of age, your health care provider may check your cholesterol levels every 4-6 years.  You may be checked more often if you have high cholesterol or other risk factors for heart disease. The blood test for cholesterol  measures:  "Bad" cholesterol, or LDL cholesterol. This is the main type of cholesterol that causes heart disease. The desired level is less than 100 mg/dL.  "Good" cholesterol, or HDL cholesterol. HDL helps protect against heart disease by cleaning the arteries and carrying the LDL to the liver for processing. The desired level for HDL is 60 mg/dL or higher.  Triglycerides. These are fats that your body can store or burn for energy. The desired level is less than 150 mg/dL.  Total cholesterol. This measures the total amount of cholesterol in your blood and includes LDL, HDL, and triglycerides. The desired level is less than 200 mg/dL. How is this treated? This condition may be treated with:  Diet changes. You may be asked to eat foods that have more fiber and less saturated fats or added sugar.  Lifestyle changes. These may include regular exercise, maintaining a healthy weight, and quitting use of tobacco products.  Medicines. These are given when diet and lifestyle changes have not worked. You may be prescribed a statin medicine to help lower your cholesterol levels. Follow these instructions at home: Eating and drinking  Eat a healthy, balanced diet. This diet includes: ? Daily servings of a variety of fresh, frozen, or canned fruits and vegetables. ? Daily servings of whole grain foods that are rich in fiber. ? Foods that are low in saturated fats and trans fats. These include poultry and fish without skin, lean cuts of meat, and low-fat dairy products. ? A variety of fish, especially oily fish that contain omega-3 fatty acids. Aim to  eat fish at least 2 times a week.  Avoid foods and drinks that have added sugar.  Use healthy cooking methods, such as roasting, grilling, broiling, baking, poaching, steaming, and stir-frying. Do not fry your food except for stir-frying.   Lifestyle  Get regular exercise. Aim to exercise for a total of 150 minutes a week. Increase your activity level  by doing activities such as gardening, walking, and taking the stairs.  Do not use any products that contain nicotine or tobacco, such as cigarettes, e-cigarettes, and chewing tobacco. If you need help quitting, ask your health care provider.   General instructions  Take over-the-counter and prescription medicines only as told by your health care provider.  Keep all follow-up visits as told by your health care provider. This is important. Where to find more information  American Heart Association: www.heart.org  National Heart, Lung, and Blood Institute: https://wilson-eaton.com/ Contact a health care provider if:  You have trouble achieving or maintaining a healthy diet or weight.  You are starting an exercise program.  You are unable to stop smoking. Get help right away if:  You have chest pain.  You have trouble breathing.  You have any symptoms of a stroke. "BE FAST" is an easy way to remember the main warning signs of a stroke: ? B - Balance. Signs are dizziness, sudden trouble walking, or loss of balance. ? E - Eyes. Signs are trouble seeing or a sudden change in vision. ? F - Face. Signs are sudden weakness or numbness of the face, or the face or eyelid drooping on one side. ? A - Arms. Signs are weakness or numbness in an arm. This happens suddenly and usually on one side of the body. ? S - Speech. Signs are sudden trouble speaking, slurred speech, or trouble understanding what people say. ? T - Time. Time to call emergency services. Write down what time symptoms started.  You have other signs of a stroke, such as: ? A sudden, severe headache with no known cause. ? Nausea or vomiting. ? Seizure. These symptoms may represent a serious problem that is an emergency. Do not wait to see if the symptoms will go away. Get medical help right away. Call your local emergency services (911 in the [place] Do not drive yourself to the hospital. Summary  Cholesterol plaques increase your  risk for heart attack and stroke. Work with your health care provider to keep your cholesterol levels in a healthy range.  Eat a healthy, balanced diet, get regular exercise, and maintain a healthy weight.  Do not use any products that contain nicotine or tobacco, such as cigarettes, e-cigarettes, and chewing tobacco.  Get help right away if you have any symptoms of a stroke. This information is not intended to replace advice given to you by your health care provider. Make sure you discuss any questions you have with your health care provider. Document Revised: 02/15/2019 Document Reviewed: 02/15/2019 Elsevier Patient Education  2021 [employer].

## 2020-07-26 DIAGNOSIS — N182 Chronic kidney disease, stage 2 (mild): Secondary | ICD-10-CM | POA: Diagnosis not present

## 2020-07-26 DIAGNOSIS — H2511 Age-related nuclear cataract, right eye: Secondary | ICD-10-CM | POA: Diagnosis not present

## 2020-07-26 DIAGNOSIS — I129 Hypertensive chronic kidney disease with stage 1 through stage 4 chronic kidney disease, or unspecified chronic kidney disease: Secondary | ICD-10-CM | POA: Diagnosis not present

## 2020-08-09 DIAGNOSIS — M329 Systemic lupus erythematosus, unspecified: Secondary | ICD-10-CM | POA: Diagnosis not present

## 2020-08-13 DIAGNOSIS — R0902 Hypoxemia: Secondary | ICD-10-CM | POA: Diagnosis not present

## 2020-08-18 ENCOUNTER — Other Ambulatory Visit: Payer: Self-pay | Admitting: Internal Medicine

## 2020-08-18 DIAGNOSIS — E039 Hypothyroidism, unspecified: Secondary | ICD-10-CM

## 2020-08-18 NOTE — Telephone Encounter (Signed)
Requested medications are due for refill today.  Yes  Requested medications are on the active medications list.  yes  Last refill. 06/26/2020  Future visit scheduled.   no  Notes to clinic.  Please advise.

## 2020-08-30 ENCOUNTER — Telehealth: Payer: Self-pay

## 2020-08-30 NOTE — Chronic Care Management (AMB) (Addendum)
Chronic Care Management Pharmacy Assistant   Name: Edythe Riches  MRN: [id number] DOB: 1950-06-01  Reason for Encounter: Disease State - Chronic resp failure and HTN   Recent office visits:  None since last CCM contact  Recent consult visits:  07/26/20 - Ophthalmology  No data available  Hospital visits:  None in previous 6 months  Medications: Outpatient Encounter Medications as of 08/30/2020  Medication Sig   albuterol (VENTOLIN HFA) 108 (90 Base) MCG/ACT inhaler Inhale into the lungs every 6 (six) hours as needed for wheezing or shortness of breath.   budesonide-formoterol (SYMBICORT) 160-4.5 MCG/ACT inhaler Inhale into the lungs.   Cholecalciferol (VITAMIN D3) 250 MCG (10000 UT) capsule Take 10,000 Units by mouth daily. 5,000   diazepam (VALIUM) 5 MG tablet TAKE 1 TABLET BY MOUTH DAILY AS NEEDED FOR ANXIETY.   furosemide (LASIX) 20 MG tablet TAKE 1 TABLET BY MOUTH TWICE A DAY   hydroxychloroquine (PLAQUENIL) 200 MG tablet Take 2 tablets by mouth daily.   levothyroxine (SYNTHROID) 50 MCG tablet TAKE 1 TABLET BY MOUTH EVERY DAY BEFORE BREAKFAST   metoprolol succinate (TOPROL-XL) 25 MG 24 hr tablet TAKE 1 TABLET (25 MG TOTAL) BY MOUTH DAILY.   Multiple Vitamins-Minerals (WOMENS MULTIVITAMIN PO) Take by mouth.   NIFEdipine (ADALAT CC) 30 MG 24 hr tablet Take 1 tablet by mouth daily.   ondansetron (ZOFRAN) 4 MG tablet Take 4 mg by mouth every 8 (eight) hours as needed for nausea or vomiting.   OXYGEN Inhale into the lungs.   pantoprazole (PROTONIX) 40 MG tablet TAKE 1 TABLET BY MOUTH EVERY DAY   Potassium 99 MG TABS Take by mouth.   predniSONE (DELTASONE) 1 MG tablet Take 3 mg by mouth daily with breakfast.   riTUXimab (RITUXAN IV) Inject into the vein every 6 (six) months.   sertraline (ZOLOFT) 100 MG tablet TAKE 1 TABLET BY MOUTH EVERY DAY   sulfamethoxazole-trimethoprim (BACTRIM DS) 800-160 MG tablet Take 1 tablet by mouth 3 (three) times a week.   No  facility-administered encounter medications on file as of 08/30/2020.   Chronic Respiratory Failure: Followed by [facility]  Current regimen:  Symbicort - 2 puffs twice daily Albuterol - PRN Oxygen - wears at night and PRN during day The patient confirms these medications   Any recent hospitalizations or ED visits since last visit with CPP? No  denies symptoms, including Increased shortness of breath , Rescue medicine is not helping, Shortness of breath at rest, Symptoms worse with exercise, Symptoms worse at night and Wheezing   What recent interventions/DTPs have been made by any provider to improve breathing since last visit: the patient reports she uses the oxygen through the night while sleeping and she is feeling better through out the day    Have you had exacerbation/flare-up since last visit? No  What do you do when you are short of breath?  Other - the patient has rescue inhaler and also will use the oxygen   Respiratory Devices/Equipment Do you have a nebulizer? No Do you use a Peak Flow Meter? No Do you use a maintenance inhaler? Yes Symbicort 160-4.58mcg/act  How often do you forget to use your daily inhaler? never Do you use a rescue inhaler? Yes How often do you use your rescue inhaler?  several times per month Do you use a spacer with your inhaler? No  Adherence Review: Does the patient have >5 day gap between last estimated fill date for maintenance inhaler medications? Yes, but confirms  daily adherence as prescribed  Drug Name  Last Fill Date Day Supply Symbicort   06/07/20  30 DS  Recent Office Vitals: BP Readings from Last 3 Encounters:  04/25/20 106/70  10/01/19 136/74  11/18/12 (!) 99/44   Pulse Readings from Last 3 Encounters:  04/25/20 67  10/01/19 88  11/18/12 73    Wt Readings from Last 3 Encounters:  04/25/20 187 lb (84.8 kg)  10/01/19 188 lb (85.3 kg)  11/18/12 244 lb (110.7 kg)     Kidney Function Lab Results  Component Value  Date/Time   CREATININE 1.09 04/25/2020 11:32 AM   CREATININE 0.95 10/20/2012 12:45 PM   GFR 52.33 (L) 04/25/2020 11:32 AM    BMP Latest Ref Rng & Units 04/25/2020 10/20/2012  Glucose 70 - 99 mg/dL 81 91  BUN 6 - 23 mg/dL 17 15  Creatinine 0.40 - 1.20 mg/dL 1.09 0.95  Sodium 135 - 145 mEq/L 140 142  Potassium 3.5 - 5.1 mEq/L 3.8 4.3  Chloride 96 - 112 mEq/L 101 103  CO2 19 - 32 mEq/L 30 28  Calcium 8.4 - 10.5 mg/dL 10.1 9.5   Current antihypertensive regimen:  Nifedipine 30 mg - 1 tablet daily Metoprolol succinate 25 mg - 1 tablet daily Furosemide 20 mg - 1 tablet BID   Patient verbally confirms she is taking the above medications as directed. Yes  How often are you checking your Blood Pressure? several times per month  she checks her blood pressure in the morning before taking her medication.  Current home BP readings:  DATE:             BP               PULSE  09/06/20  113/64  53    Wrist or arm cuff: arm cuff Caffeine intake: 2 cups of coffee in the morning Salt intake: limits with cooking Any readings above 180/120? No  What recent interventions/DTPs have been made by any provider to improve Blood Pressure control since last CPP Visit: none identified  Any recent hospitalizations or ED visits since last visit with CPP? No  What diet changes have been made to improve Blood Pressure Control?  The patient reports she cooks healthy food at home limits going to restaurants  What exercise is being done to improve your Blood Pressure Control?  The patient reports she is trying to increase walking for exercise  Adherence Review: Is the patient currently on ACE/ARB medication? No  Star Rating Drugs:  Medication:  Last Fill: Day Supply None identified   Follow-Up:  Pharmacist Review  Debbora Dus, CPP notified  Avel Sensor, [place] Assistant [phone]   I have reviewed the care management and care coordination activities outlined in this  encounter and I am certifying that I agree with the content of this note. No further action required.  Debbora Dus, PharmD Clinical Pharmacist [facility] at [facility] [phone]

## 2020-09-01 ENCOUNTER — Other Ambulatory Visit: Payer: Self-pay | Admitting: Internal Medicine

## 2020-09-13 DIAGNOSIS — R0902 Hypoxemia: Secondary | ICD-10-CM | POA: Diagnosis not present

## 2020-09-29 DIAGNOSIS — Z03818 Encounter for observation for suspected exposure to other biological agents ruled out: Secondary | ICD-10-CM | POA: Diagnosis not present

## 2020-09-29 DIAGNOSIS — B9689 Other specified bacterial agents as the cause of diseases classified elsewhere: Secondary | ICD-10-CM | POA: Diagnosis not present

## 2020-09-29 DIAGNOSIS — J849 Interstitial pulmonary disease, unspecified: Secondary | ICD-10-CM | POA: Diagnosis not present

## 2020-09-29 DIAGNOSIS — J019 Acute sinusitis, unspecified: Secondary | ICD-10-CM | POA: Diagnosis not present

## 2020-09-29 DIAGNOSIS — J209 Acute bronchitis, unspecified: Secondary | ICD-10-CM | POA: Diagnosis not present

## 2020-10-13 DIAGNOSIS — R0902 Hypoxemia: Secondary | ICD-10-CM | POA: Diagnosis not present

## 2020-11-13 DIAGNOSIS — R0902 Hypoxemia: Secondary | ICD-10-CM | POA: Diagnosis not present

## 2020-11-14 ENCOUNTER — Telehealth: Payer: Self-pay

## 2020-11-14 NOTE — Telephone Encounter (Signed)
Patient called for levothyroxine refill. She has not transitioning care to new PCP yet but would like to see Charmian Muff, NP. Transferred line to [facility] for scheduling. In meantime, are we able to refill levothyroxine for short supply? She is using [facility].  Debbora Dus, PharmD Clinical Pharmacist [facility] at [facility] [phone]

## 2020-11-15 NOTE — Telephone Encounter (Signed)
Refill request Levothyroxine Last office visit 1/25/2 Upcoming appointment 11/20/20 Romilda Garret NP

## 2020-11-20 ENCOUNTER — Other Ambulatory Visit: Payer: Self-pay

## 2020-11-20 ENCOUNTER — Ambulatory Visit (INDEPENDENT_AMBULATORY_CARE_PROVIDER_SITE_OTHER): Payer: Medicare HMO | Admitting: Nurse Practitioner

## 2020-11-20 ENCOUNTER — Encounter: Payer: Self-pay | Admitting: Nurse Practitioner

## 2020-11-20 VITALS — BP 126/60 | HR 77 | Temp 98.2°F | Resp 12 | Ht 62.5 in | Wt 203.5 lb

## 2020-11-20 DIAGNOSIS — E119 Type 2 diabetes mellitus without complications: Secondary | ICD-10-CM | POA: Diagnosis not present

## 2020-11-20 DIAGNOSIS — I10 Essential (primary) hypertension: Secondary | ICD-10-CM

## 2020-11-20 DIAGNOSIS — E039 Hypothyroidism, unspecified: Secondary | ICD-10-CM

## 2020-11-20 DIAGNOSIS — J9610 Chronic respiratory failure, unspecified whether with hypoxia or hypercapnia: Secondary | ICD-10-CM

## 2020-11-20 DIAGNOSIS — M329 Systemic lupus erythematosus, unspecified: Secondary | ICD-10-CM

## 2020-11-20 DIAGNOSIS — K219 Gastro-esophageal reflux disease without esophagitis: Secondary | ICD-10-CM | POA: Diagnosis not present

## 2020-11-20 DIAGNOSIS — R42 Dizziness and giddiness: Secondary | ICD-10-CM | POA: Diagnosis not present

## 2020-11-20 DIAGNOSIS — E7800 Pure hypercholesterolemia, unspecified: Secondary | ICD-10-CM

## 2020-11-20 LAB — COMPREHENSIVE METABOLIC PANEL
ALT: 14 U/L (ref 0–35)
AST: 15 U/L (ref 0–37)
Albumin: 4.1 g/dL (ref 3.5–5.2)
Alkaline Phosphatase: 78 U/L (ref 39–117)
BUN: 18 mg/dL (ref 6–23)
CO2: 30 mEq/L (ref 19–32)
Calcium: 9.4 mg/dL (ref 8.4–10.5)
Chloride: 103 mEq/L (ref 96–112)
Creatinine, Ser: 1.06 mg/dL (ref 0.40–1.20)
GFR: 53.9 mL/min — ABNORMAL LOW (ref >60.00)
Glucose, Bld: 79 mg/dL (ref 70–99)
Potassium: 4.1 mEq/L (ref 3.5–5.1)
Sodium: 142 mEq/L (ref 135–145)
Total Bilirubin: 0.4 mg/dL (ref 0.2–1.2)
Total Protein: 6 g/dL (ref 6.0–8.3)

## 2020-11-20 LAB — LIPID PANEL
Cholesterol: 294 mg/dL — ABNORMAL HIGH (ref 0–200)
HDL: 66.5 mg/dL (ref >39.00)
LDL Cholesterol: 199 mg/dL — ABNORMAL HIGH (ref 0–99)
NonHDL: 227.37
Total CHOL/HDL Ratio: 4
Triglycerides: 143 mg/dL (ref 0.0–149.0)
VLDL: 28.6 mg/dL (ref 0.0–40.0)

## 2020-11-20 LAB — CBC
HCT: 40.4 % (ref 36.0–46.0)
Hemoglobin: 13.2 g/dL (ref 12.0–15.0)
MCHC: 32.6 g/dL (ref 30.0–36.0)
MCV: 88.1 fl (ref 78.0–100.0)
Platelets: 264 10*3/uL (ref 150.0–400.0)
RBC: 4.59 Mil/uL (ref 3.87–5.11)
RDW: 14.4 % (ref 11.5–15.5)
WBC: 5.9 10*3/uL (ref 4.0–10.5)

## 2020-11-20 LAB — HEMOGLOBIN A1C: Hgb A1c MFr Bld: 5.5 % (ref 4.6–6.5)

## 2020-11-20 LAB — T4, FREE: Free T4: 0.61 ng/dL (ref 0.60–1.60)

## 2020-11-20 LAB — TSH: TSH: 2.49 u[IU]/mL (ref 0.35–5.50)

## 2020-11-20 NOTE — Assessment & Plan Note (Signed)
Currently managed through rheumatology at [facility].  Patient managed and maintained on rituximab and hydroxychloroquine.  Continue medication as prescribed and follow-up as scheduled with specialist at [facility].

## 2020-11-20 NOTE — Assessment & Plan Note (Signed)
Last lipids in clinic highly elevated.  Patient states she had troubles with lipids in the past since her 94s.  Has tried several statins and did not tolerate.  Patient states she is willing to get a lipid clinic if necessary.  Pending lab results.  Looking allergies list patient does have intolerance to ezetimibe also.

## 2020-11-20 NOTE — Progress Notes (Signed)
Established Patient Office Visit  Subjective:  Patient ID: Dana Doyle, female    DOB: 1950-10-04  Age: 70 y.o. MRN: [id number]  CC:  Chief Complaint  Patient presents with   Transfer of Care   Lab work    Patient would like to have her TSH and A1C checked.     HPI [name] presents for Transfer of care  HTN: Checks bp at home 2-3 times weekly. Maintained on Nifedipine and metoprolol.  Chronic Resp Failure: Oxygen at night 2-2.5 liters at night. Pulmonology at [facility]. She is maintained on albuterol and symbicort   GERD: States been on it for a long time and only takes it every other day.  No triggers identify  DM2: Last A1C was  5.3 6 months. Exercise walks when she is out and about. With the heat and humidity.  SLE: managed by [facility]. On Rituximab and hydroxychloroquine.  HLD: last cholesterol panel was high. States that she has had trouble with her cholesterol since her 59s. She has tried statins in the past and was unable tolerate them. She is willing to go to a lipid clinic.  Past Medical History:  Diagnosis Date   Anxiety    Arthritis    Diabetes mellitus without complication (HCC)    GERD (gastroesophageal reflux disease)    Giardia    Hyperlipidemia    Hypertension    IBS (irritable bowel syndrome)    MVP (mitral valve prolapse)    Obesity    Thyroid disease     Past Surgical History:  Procedure Laterality Date   ABDOMINAL HYSTERECTOMY     total   CHOLECYSTECTOMY     CYSTECTOMY Right    hip   REPLACEMENT TOTAL KNEE BILATERAL     REVISION TOTAL KNEE ARTHROPLASTY Right    TONSILLECTOMY AND ADENOIDECTOMY      Family History  Adopted: Yes  Problem Relation Age of Onset   Cancer Brother     Social History   Socioeconomic History   Marital status: Married    Spouse name: Not on file   Number of children: 3   Years of education: Not on file   Highest education level: Not on file  Occupational History   Not on file  Tobacco Use    Smoking status: Never   Smokeless tobacco: Never  Substance and Sexual Activity   Alcohol use: No   Drug use: No   Sexual activity: Never    Birth control/protection: Surgical  Other Topics Concern   Not on file  Social History Narrative   Daily caffeine    Social Determinants of Health   Financial Resource Strain: Low Risk    Difficulty of Paying Living Expenses: Not very hard  Food Insecurity: Not on file  Transportation Needs: Not on file  Physical Activity: Not on file  Stress: Not on file  Social Connections: Not on file  Intimate Partner Violence: Not on file    Outpatient Medications Prior to Visit  Medication Sig Dispense Refill   albuterol (VENTOLIN HFA) 108 (90 Base) MCG/ACT inhaler Inhale into the lungs every 6 (six) hours as needed for wheezing or shortness of breath.     budesonide-formoterol (SYMBICORT) 160-4.5 MCG/ACT inhaler Inhale into the lungs.     Cholecalciferol (VITAMIN D3) 250 MCG (10000 UT) capsule Take 10,000 Units by mouth daily. 5,000     diazepam (VALIUM) 5 MG tablet TAKE 1 TABLET BY MOUTH DAILY AS NEEDED FOR ANXIETY. 15 tablet 0  furosemide (LASIX) 20 MG tablet TAKE 1 TABLET BY MOUTH TWICE A DAY 180 tablet 1   hydroxychloroquine (PLAQUENIL) 200 MG tablet Take 2 tablets by mouth daily.     levothyroxine (SYNTHROID) 50 MCG tablet TAKE 1 TABLET BY MOUTH EVERY DAY BEFORE BREAKFAST 90 tablet 0   metoprolol succinate (TOPROL-XL) 25 MG 24 hr tablet TAKE 1 TABLET (25 MG TOTAL) BY MOUTH DAILY. 90 tablet 1   Multiple Vitamins-Minerals (WOMENS MULTIVITAMIN PO) Take by mouth.     NIFEdipine (ADALAT CC) 30 MG 24 hr tablet Take 1 tablet by mouth daily.     ondansetron (ZOFRAN) 4 MG tablet Take 4 mg by mouth every 8 (eight) hours as needed for nausea or vomiting.     OXYGEN Inhale into the lungs.     pantoprazole (PROTONIX) 40 MG tablet TAKE 1 TABLET BY MOUTH EVERY DAY 90 tablet 2   Potassium 99 MG TABS Take by mouth.     predniSONE (DELTASONE) 5 MG tablet Take  5 mg by mouth daily with breakfast.     riTUXimab (RITUXAN IV) Inject into the vein every 6 (six) months.     sertraline (ZOLOFT) 100 MG tablet TAKE 1 TABLET BY MOUTH EVERY DAY 90 tablet 1   sulfamethoxazole-trimethoprim (BACTRIM DS) 800-160 MG tablet Take 1 tablet by mouth 3 (three) times a week.     predniSONE (DELTASONE) 1 MG tablet Take 3 mg by mouth daily with breakfast.     No facility-administered medications prior to visit.    Allergies  Allergen Reactions   Belimumab Other (See Comments) and Nausea And Vomiting   Mycophenolate Mofetil Other (See Comments) and Nausea And Vomiting   Phenergan [Promethazine Hcl] Anaphylaxis   Codeine Hives, Itching and Other (See Comments)   Meloxicam Hives   Statins Other (See Comments)    Muscle spasm and cramps   Ezetimibe    Tape     Other reaction(s): Other (See Comments) Very thin skin due to chronic steroid use     ROS Review of Systems  Constitutional:  Positive for fatigue. Negative for chills and fever.  Respiratory:  Positive for shortness of breath.   Cardiovascular:  Negative for chest pain.  Gastrointestinal:  Negative for rectal pain and vomiting.  Musculoskeletal:  Positive for arthralgias.  Skin:  Negative for color change and pallor.  Neurological:  Positive for dizziness. Negative for seizures and weakness.     Objective:    Physical Exam Vitals and nursing note reviewed.  Constitutional:      Appearance: She is obese.  HENT:     Right Ear: Tympanic membrane, ear canal and external ear normal. There is no impacted cerumen.     Left Ear: Tympanic membrane, ear canal and external ear normal. There is no impacted cerumen.     Mouth/Throat:     Mouth: Mucous membranes are moist.     Pharynx: Oropharynx is clear.  Eyes:     Extraocular Movements: Extraocular movements intact.     Pupils: Pupils are equal, round, and reactive to light.  Neck:     Vascular: No carotid bruit.  Cardiovascular:     Rate and Rhythm:  Normal rate and regular rhythm.     Pulses: Normal pulses.     Heart sounds: No murmur heard. Pulmonary:     Effort: Pulmonary effort is normal.     Breath sounds: Normal breath sounds.  Abdominal:     General: Bowel sounds are normal.  Musculoskeletal:  Right lower leg: No edema.     Left lower leg: No edema.  Lymphadenopathy:     Cervical: No cervical adenopathy.  Skin:    General: Skin is warm.  Neurological:     Mental Status: She is alert.     Motor: No weakness.     Coordination: Coordination normal.     Gait: Gait abnormal (Patient walks with walker).     Deep Tendon Reflexes: Reflexes normal.  Psychiatric:        Mood and Affect: Mood normal.        Behavior: Behavior normal.        Thought Content: Thought content normal.        Judgment: Judgment normal.    BP 126/60   Pulse 77   Temp 98.2 F (36.8 C)   Resp 12   Ht 5' 2.5" (1.588 m)   Wt 203 lb 8 oz (92.3 kg)   SpO2 95%   BMI 36.63 kg/m  Wt Readings from Last 3 Encounters:  11/20/20 203 lb 8 oz (92.3 kg)  04/25/20 187 lb (84.8 kg)  10/01/19 188 lb (85.3 kg)     Health Maintenance Due  Topic Date Due   FOOT EXAM  Never done   OPHTHALMOLOGY EXAM  Never done   URINE MICROALBUMIN  Never done   Zoster Vaccines- Shingrix (1 of 2) Never done   MAMMOGRAM  10/29/2014   DEXA SCAN  Never done   COVID-19 Vaccine (3 - Pfizer risk series) 01/06/2020   HEMOGLOBIN A1C  10/23/2020   INFLUENZA VACCINE  10/30/2020    There are no preventive care reminders to display for this patient.  Lab Results  Component Value Date   TSH 4.78 (H) 04/25/2020   Lab Results  Component Value Date   WBC 6.2 04/25/2020   HGB 13.5 04/25/2020   HCT 40.0 04/25/2020   MCV 88.6 04/25/2020   PLT 303.0 04/25/2020   Lab Results  Component Value Date   NA 140 04/25/2020   K 3.8 04/25/2020   CO2 30 04/25/2020   GLUCOSE 81 04/25/2020   BUN 17 04/25/2020   CREATININE 1.09 04/25/2020   BILITOT 0.5 04/25/2020   ALKPHOS 83  04/25/2020   AST 13 04/25/2020   ALT 12 04/25/2020   PROT 6.3 04/25/2020   ALBUMIN 4.4 04/25/2020   CALCIUM 10.1 04/25/2020   GFR 52.33 (L) 04/25/2020   Lab Results  Component Value Date   CHOL 308 (H) 04/25/2020   Lab Results  Component Value Date   HDL 63.00 04/25/2020   Lab Results  Component Value Date   LDLCALC 202 (H) 10/20/2012   Lab Results  Component Value Date   TRIG 226.0 (H) 04/25/2020   Lab Results  Component Value Date   CHOLHDL 5 04/25/2020   Lab Results  Component Value Date   HGBA1C 5.3 04/25/2020      Assessment & Plan:   Problem List Items Addressed This Visit       Cardiovascular and Mediastinum   HTN (hypertension)    Patient does check blood pressure at home 2-3 times weekly.  Continue doing same.  Maintained on nifedipine and metoprolol. Continue nifedipine and metoprolol.        Respiratory   Chronic respiratory failure ([place]) - Primary    Managed by pulmonology.  Patient states she has diagnosis of interstitial lung disease.  Maintained on albuterol and Symbicort inhalers.  Does wear oxygen at night patient states between 2 and 2.5  L.  Does not need oxygen throughout the day.  Continue albuterol and Symbicort as prescribed and follow-up with pulmonology as directed.      Relevant Orders   Comprehensive metabolic panel   CBC     Digestive   GERD (gastroesophageal reflux disease)    Has prescription for Protonix 40 mg.  Patient states she takes it every other day.  Doing this regimen maintains her GERD.  Continue Protonix 40 mg every other day.        Endocrine   DM2 (diabetes mellitus, type 2) ([place])    History of same.  Last hemoglobin A1c was 5.3% in office.  We will recheck today to monitor patient's progress.  Pending lab results      Relevant Orders   Comprehensive metabolic panel   Hemoglobin A1c   Hypothyroidism    Currently maintained on levothyroxine 50 mcg daily.  Did review proper instructions on taking medication.   Per patient's last TSH was slightly abnormal.  Recheck in office today, pending labs.      Relevant Orders   TSH   T4, free     Musculoskeletal and Integument   SLE (systemic lupus erythematosus related syndrome) ([place])    Currently managed through rheumatology at [facility].  Patient managed and maintained on rituximab and hydroxychloroquine.  Continue medication as prescribed and follow-up as scheduled with specialist at [facility]        Other   Hypercholesterolemia    Last lipids in clinic highly elevated.  Patient states she had troubles with lipids in the past since her 60s.  Has tried several statins and did not tolerate.  Patient states she is willing to get a lipid clinic if necessary.  Pending lab results.  Looking allergies list patient does have intolerance to ezetimibe also.      Relevant Orders   Comprehensive metabolic panel   Lipid panel   Dizziness    Patient states is intermittent in nature.  We discussed proper hydration as she is on antihypertensives and fluid pill.  Patient knowledge understand do believe peripheral in nature.  Neuro normal neuro exam in office today.  We will continue to monitor       No orders of the defined types were placed in this encounter.   Follow-up: Return in about 6 months (around 05/23/2021) for Wellness and labs.  This visit occurred during the SARS-CoV-2 public health emergency.  Safety protocols were in place, including screening questions prior to the visit, additional usage of staff PPE, and extensive cleaning of exam room while observing appropriate contact time as indicated for disinfecting solutions.     Romilda Garret, NP

## 2020-11-20 NOTE — Assessment & Plan Note (Signed)
Has prescription for Protonix 40 mg.  Patient states she takes it every other day.  Doing this regimen maintains her GERD.  Continue Protonix 40 mg every other day.

## 2020-11-20 NOTE — Assessment & Plan Note (Signed)
Currently maintained on levothyroxine 50 mcg daily.  Did review proper instructions on taking medication.  Per patient's last TSH was slightly abnormal.  Recheck in office today, pending labs.

## 2020-11-20 NOTE — Assessment & Plan Note (Signed)
History of same.  Last hemoglobin A1c was 5.3% in office.  We will recheck today to monitor patient's progress.  Pending lab results

## 2020-11-20 NOTE — Assessment & Plan Note (Signed)
Managed by pulmonology.  Patient states she has diagnosis of interstitial lung disease.  Maintained on albuterol and Symbicort inhalers.  Does wear oxygen at night patient states between 2 and 2.5 L.  Does not need oxygen throughout the day.  Continue albuterol and Symbicort as prescribed and follow-up with pulmonology as directed.

## 2020-11-20 NOTE — Patient Instructions (Signed)
Pleasure meeting you today I will be in touch about your labs via my chart.  Follow up with me in 6 months, sooner if needed

## 2020-11-20 NOTE — Assessment & Plan Note (Signed)
Patient states is intermittent in nature.  We discussed proper hydration as she is on antihypertensives and fluid pill.  Patient knowledge understand do believe peripheral in nature.  Neuro normal neuro exam in office today.  We will continue to monitor

## 2020-11-20 NOTE — Assessment & Plan Note (Signed)
Patient does check blood pressure at home 2-3 times weekly.  Continue doing same.  Maintained on nifedipine and metoprolol. Continue nifedipine and metoprolol.

## 2020-11-21 ENCOUNTER — Other Ambulatory Visit: Payer: Self-pay | Admitting: Nurse Practitioner

## 2020-11-21 ENCOUNTER — Telehealth: Payer: Self-pay | Admitting: Nurse Practitioner

## 2020-11-21 DIAGNOSIS — E039 Hypothyroidism, unspecified: Secondary | ICD-10-CM

## 2020-11-21 MED ORDER — LEVOTHYROXINE SODIUM 50 MCG PO TABS: ORAL_TABLET | ORAL | 3 refills | Status: DC

## 2020-11-21 NOTE — Telephone Encounter (Signed)
Patient advised.

## 2020-11-21 NOTE — Telephone Encounter (Signed)
Pt returning your call about lab results  

## 2020-11-21 NOTE — Telephone Encounter (Signed)
Still waiting on levothyroxine refill to [facility]. Will forward to [facility] to see if he can refill it for patient (she has upcoming appt with him scheduled).  Debbora Dus, PharmD Clinical Pharmacist [facility] at [facility] [phone]

## 2020-11-21 NOTE — Telephone Encounter (Signed)
Spoke with patient see lab result note. 

## 2020-11-22 ENCOUNTER — Other Ambulatory Visit: Payer: Self-pay | Admitting: Nurse Practitioner

## 2020-11-22 DIAGNOSIS — E7800 Pure hypercholesterolemia, unspecified: Secondary | ICD-10-CM

## 2020-12-14 ENCOUNTER — Telehealth: Payer: Self-pay

## 2020-12-14 DIAGNOSIS — R0902 Hypoxemia: Secondary | ICD-10-CM | POA: Diagnosis not present

## 2020-12-14 NOTE — Chronic Care Management (AMB) (Addendum)
Chronic Care Management Pharmacy Assistant   Name: Dana Doyle  MRN: [id number] DOB: 09-24-1950  Reason for Encounter: HTN review  Recent office visits:  11/20/20 - Family Medicine - Patient presented for transfer of care. Labs ordered (new A1C 5.5). Referral to [facility].  Recent consult visits:  09/29/20 - [facility] - Patient presented for nasal congestion, cough, and ear pressure. Started Levaquin '500mg'$  once daily for 10 days, prednisone 20 mg take 1 tablet 2 times daily, benzonatate '200mg'$  take 1 capsule 3 times daily.  Hospital visits:  None in previous 6 months  Medications: Outpatient Encounter Medications as of 12/14/2020  Medication Sig   albuterol (VENTOLIN HFA) 108 (90 Base) MCG/ACT inhaler Inhale into the lungs every 6 (six) hours as needed for wheezing or shortness of breath.   budesonide-formoterol (SYMBICORT) 160-4.5 MCG/ACT inhaler Inhale into the lungs.   Cholecalciferol (VITAMIN D3) 250 MCG (10000 UT) capsule Take 10,000 Units by mouth daily. 5,000   diazepam (VALIUM) 5 MG tablet TAKE 1 TABLET BY MOUTH DAILY AS NEEDED FOR ANXIETY.   furosemide (LASIX) 20 MG tablet TAKE 1 TABLET BY MOUTH TWICE A DAY   hydroxychloroquine (PLAQUENIL) 200 MG tablet Take 2 tablets by mouth daily.   levothyroxine (SYNTHROID) 50 MCG tablet TAKE 1 TABLET BY MOUTH EVERY DAY BEFORE BREAKFAST   metoprolol succinate (TOPROL-XL) 25 MG 24 hr tablet TAKE 1 TABLET (25 MG TOTAL) BY MOUTH DAILY.   Multiple Vitamins-Minerals (WOMENS MULTIVITAMIN PO) Take by mouth.   NIFEdipine (ADALAT CC) 30 MG 24 hr tablet Take 1 tablet by mouth daily.   ondansetron (ZOFRAN) 4 MG tablet Take 4 mg by mouth every 8 (eight) hours as needed for nausea or vomiting.   OXYGEN Inhale into the lungs.   pantoprazole (PROTONIX) 40 MG tablet TAKE 1 TABLET BY MOUTH EVERY DAY   Potassium 99 MG TABS Take by mouth.   predniSONE (DELTASONE) 5 MG tablet Take 5 mg by mouth daily with breakfast.   riTUXimab  (RITUXAN IV) Inject into the vein every 6 (six) months.   sertraline (ZOLOFT) 100 MG tablet TAKE 1 TABLET BY MOUTH EVERY DAY   sulfamethoxazole-trimethoprim (BACTRIM DS) 800-160 MG tablet Take 1 tablet by mouth 3 (three) times a week.   No facility-administered encounter medications on file as of 12/14/2020.    Recent Office Vitals: BP Readings from Last 3 Encounters:  11/20/20 126/60  04/25/20 106/70  10/01/19 136/74   Pulse Readings from Last 3 Encounters:  11/20/20 77  04/25/20 67  10/01/19 88    Wt Readings from Last 3 Encounters:  11/20/20 203 lb 8 oz (92.3 kg)  04/25/20 187 lb (84.8 kg)  10/01/19 188 lb (85.3 kg)     Kidney Function Lab Results  Component Value Date/Time   CREATININE 1.06 11/20/2020 12:45 PM   CREATININE 1.09 04/25/2020 11:32 AM   CREATININE 0.95 10/20/2012 12:45 PM   GFR 53.90 (L) 11/20/2020 12:45 PM    BMP Latest Ref Rng & Units 11/20/2020 04/25/2020 10/20/2012  Glucose 70 - 99 mg/dL 79 81 91  BUN 6 - 23 mg/dL '18 17 15  '$ Creatinine 0.40 - 1.20 mg/dL 1.06 1.09 0.95  Sodium 135 - 145 mEq/L 142 140 142  Potassium 3.5 - 5.1 mEq/L 4.1 3.8 4.3  Chloride 96 - 112 mEq/L 103 101 103  CO2 19 - 32 mEq/L '30 30 28  '$ Calcium 8.4 - 10.5 mg/dL 9.4 10.1 9.5     Contacted patient on 12/20/20 to discuss hypertension  disease state  Current antihypertensive regimen:  Nifedipine 30 mg - 1 tablet daily Metoprolol succinate 25 mg - 1 tablet daily Furosemide 20 mg - 1 tablet BID   Patient verbally confirms she is taking the above medications as directed. Yes  How often are you checking your Blood Pressure? weekly  she checks her blood pressure in the morning before taking her medication.  DATE:             BP               PULSE 12/18/20  127/66  - 12/11/20  117/70  -  Wrist or arm cuff: arm  Caffeine intake:  2 cups regular coffee  Salt intake:   uses mediterranean salt  OTC medications including pseudoephedrine or NSAIDs? Tylenol arthritis   Any readings  above 180/120? No  What recent interventions/DTPs have been made by any provider to improve Blood Pressure control since last CPP Visit: none identified  Any recent hospitalizations or ED visits since last visit with CPP? No  What diet changes have been made to improve Blood Pressure Control?  Using mediterranean salt    What exercise is being done to improve your Blood Pressure Control?  The patient reports she enjoys walking for exercise. She must pay attention to air quality due to lung disease. She will go grocery shopping.   Adherence Review: Is the patient currently on ACE/ARB medication? No Does the patient have >5 day gap between last estimated fill dates?  No star meds identified  Star Rating Drugs:  Medication:  Last Fill: Day Supply None identified   Care Gaps: Annual wellness visit in last year? Yes  04/25/20 Most Recent BP reading:122/60 77-P (11/20/20)  Cardiology appointment on 03/19/21  Debbora Dus, CPP notified  Avel Sensor, [place] Assistant [phone]  I have reviewed the care management and care coordination activities outlined in this encounter and I am certifying that I agree with the content of this note. No further action required.  Debbora Dus, PharmD Clinical Pharmacist [facility] [phone]

## 2020-12-20 ENCOUNTER — Other Ambulatory Visit: Payer: Self-pay | Admitting: Nurse Practitioner

## 2020-12-20 NOTE — Telephone Encounter (Signed)
Pt called in requesting to have medication increase from 5 mg to 10 mg  Encourage patient to contact the pharmacy for refills or they can request refills through [place]:  Please schedule appointment if longer than 1 year  NEXT APPOINTMENT DATE:  MEDICATION:diazepam (VALIUM) 5 MG tablet  Is the patient out of medication?   PHARMACY:[id number] - [place], [place] - [address]   Let patient know to contact pharmacy at the end of the day to make sure medication is ready.  Please notify patient to allow 48-72 hours to process  CLINICAL FILLS OUT ALL BELOW:   LAST REFILL:  QTY:  REFILL DATE:    OTHER COMMENTS:    Okay for refill?  Please advise

## 2020-12-20 NOTE — Telephone Encounter (Signed)
Patient advised. Patient does not recall trying Trazodone in the past. She is willing to try this medication. She did also asked for refill on Diazepam 5 mg to have on hand in case, she is going out of town soon and would like to have both medications with her. [facility] [place] is the correct [facility]

## 2020-12-20 NOTE — Telephone Encounter (Signed)
Spoke with patient. Patient states she has not felt like it is working well for her anymore at 5 mg. Has tried several other medications in the past for sleep and they gave her side effects, Diazepam is what she has tolerated. Looked back at previous notes 06/15/2013 it was mentioned patient tried [place] and Ambien in the past. Patient has trouble with falling and staying asleep.  Please review. Thank you

## 2020-12-26 ENCOUNTER — Other Ambulatory Visit: Payer: Self-pay | Admitting: Nurse Practitioner

## 2020-12-26 DIAGNOSIS — F419 Anxiety disorder, unspecified: Secondary | ICD-10-CM

## 2020-12-26 MED ORDER — HYDROXYZINE PAMOATE 25 MG PO CAPS
25.0000 mg | ORAL_CAPSULE | Freq: Every day | ORAL | 0 refills | Status: DC | PRN
Start: 2020-12-26 — End: 2021-02-16

## 2020-12-26 NOTE — Progress Notes (Signed)
Spoke with patient in regards to diazepam and respiratory status. She takes it sparingly but we will try Vistaril instead since she is an infrequent user and on oxygen

## 2020-12-28 ENCOUNTER — Other Ambulatory Visit: Payer: Self-pay | Admitting: Internal Medicine

## 2021-01-02 DIAGNOSIS — M329 Systemic lupus erythematosus, unspecified: Secondary | ICD-10-CM | POA: Diagnosis not present

## 2021-01-02 DIAGNOSIS — Z79899 Other long term (current) drug therapy: Secondary | ICD-10-CM | POA: Diagnosis not present

## 2021-01-02 DIAGNOSIS — J449 Chronic obstructive pulmonary disease, unspecified: Secondary | ICD-10-CM | POA: Diagnosis not present

## 2021-01-02 DIAGNOSIS — J849 Interstitial pulmonary disease, unspecified: Secondary | ICD-10-CM | POA: Diagnosis not present

## 2021-01-13 DIAGNOSIS — R0902 Hypoxemia: Secondary | ICD-10-CM | POA: Diagnosis not present

## 2021-01-29 ENCOUNTER — Inpatient Hospital Stay (HOSPITAL_COMMUNITY)
Admission: EM | Admit: 2021-01-29 | Discharge: 2021-02-05 | DRG: 177 | Disposition: A | Discharge status: Home Health | Payer: Medicare HMO | Attending: Internal Medicine | Admitting: Internal Medicine

## 2021-01-29 ENCOUNTER — Emergency Department (HOSPITAL_COMMUNITY): Payer: Medicare HMO

## 2021-01-29 ENCOUNTER — Other Ambulatory Visit: Payer: Self-pay

## 2021-01-29 ENCOUNTER — Encounter (HOSPITAL_COMMUNITY): Payer: Self-pay | Admitting: Emergency Medicine

## 2021-01-29 DIAGNOSIS — Z9049 Acquired absence of other specified parts of digestive tract: Secondary | ICD-10-CM | POA: Diagnosis not present

## 2021-01-29 DIAGNOSIS — E1122 Type 2 diabetes mellitus with diabetic chronic kidney disease: Secondary | ICD-10-CM | POA: Diagnosis present

## 2021-01-29 DIAGNOSIS — Z888 Allergy status to other drugs, medicaments and biological substances status: Secondary | ICD-10-CM

## 2021-01-29 DIAGNOSIS — J9621 Acute and chronic respiratory failure with hypoxia: Secondary | ICD-10-CM | POA: Diagnosis present

## 2021-01-29 DIAGNOSIS — J84178 Other interstitial pulmonary diseases with fibrosis in diseases classified elsewhere: Secondary | ICD-10-CM | POA: Diagnosis present

## 2021-01-29 DIAGNOSIS — E7800 Pure hypercholesterolemia, unspecified: Secondary | ICD-10-CM | POA: Diagnosis present

## 2021-01-29 DIAGNOSIS — Z9981 Dependence on supplemental oxygen: Secondary | ICD-10-CM | POA: Diagnosis not present

## 2021-01-29 DIAGNOSIS — Z79890 Hormone replacement therapy (postmenopausal): Secondary | ICD-10-CM | POA: Diagnosis not present

## 2021-01-29 DIAGNOSIS — J9610 Chronic respiratory failure, unspecified whether with hypoxia or hypercapnia: Secondary | ICD-10-CM | POA: Diagnosis present

## 2021-01-29 DIAGNOSIS — M329 Systemic lupus erythematosus, unspecified: Secondary | ICD-10-CM | POA: Diagnosis not present

## 2021-01-29 DIAGNOSIS — R069 Unspecified abnormalities of breathing: Secondary | ICD-10-CM | POA: Diagnosis not present

## 2021-01-29 DIAGNOSIS — E119 Type 2 diabetes mellitus without complications: Secondary | ICD-10-CM

## 2021-01-29 DIAGNOSIS — Z86711 Personal history of pulmonary embolism: Secondary | ICD-10-CM

## 2021-01-29 DIAGNOSIS — J90 Pleural effusion, not elsewhere classified: Secondary | ICD-10-CM | POA: Diagnosis not present

## 2021-01-29 DIAGNOSIS — Z885 Allergy status to narcotic agent status: Secondary | ICD-10-CM

## 2021-01-29 DIAGNOSIS — Z7951 Long term (current) use of inhaled steroids: Secondary | ICD-10-CM

## 2021-01-29 DIAGNOSIS — E039 Hypothyroidism, unspecified: Secondary | ICD-10-CM | POA: Diagnosis present

## 2021-01-29 DIAGNOSIS — N179 Acute kidney failure, unspecified: Secondary | ICD-10-CM | POA: Diagnosis present

## 2021-01-29 DIAGNOSIS — D849 Immunodeficiency, unspecified: Secondary | ICD-10-CM | POA: Diagnosis present

## 2021-01-29 DIAGNOSIS — U071 COVID-19: Secondary | ICD-10-CM

## 2021-01-29 DIAGNOSIS — R059 Cough, unspecified: Secondary | ICD-10-CM | POA: Diagnosis not present

## 2021-01-29 DIAGNOSIS — N182 Chronic kidney disease, stage 2 (mild): Secondary | ICD-10-CM | POA: Diagnosis present

## 2021-01-29 DIAGNOSIS — J1282 Pneumonia due to coronavirus disease 2019: Secondary | ICD-10-CM | POA: Diagnosis present

## 2021-01-29 DIAGNOSIS — Z79899 Other long term (current) drug therapy: Secondary | ICD-10-CM

## 2021-01-29 DIAGNOSIS — Z96653 Presence of artificial knee joint, bilateral: Secondary | ICD-10-CM | POA: Diagnosis present

## 2021-01-29 DIAGNOSIS — Z7952 Long term (current) use of systemic steroids: Secondary | ICD-10-CM | POA: Diagnosis not present

## 2021-01-29 DIAGNOSIS — Z6836 Body mass index (BMI) 36.0-36.9, adult: Secondary | ICD-10-CM

## 2021-01-29 DIAGNOSIS — M3213 Lung involvement in systemic lupus erythematosus: Secondary | ICD-10-CM | POA: Diagnosis present

## 2021-01-29 DIAGNOSIS — Z801 Family history of malignant neoplasm of trachea, bronchus and lung: Secondary | ICD-10-CM

## 2021-01-29 DIAGNOSIS — J9600 Acute respiratory failure, unspecified whether with hypoxia or hypercapnia: Secondary | ICD-10-CM | POA: Diagnosis not present

## 2021-01-29 DIAGNOSIS — F32A Depression, unspecified: Secondary | ICD-10-CM | POA: Diagnosis present

## 2021-01-29 DIAGNOSIS — E669 Obesity, unspecified: Secondary | ICD-10-CM | POA: Diagnosis present

## 2021-01-29 DIAGNOSIS — K219 Gastro-esophageal reflux disease without esophagitis: Secondary | ICD-10-CM | POA: Diagnosis present

## 2021-01-29 DIAGNOSIS — J9811 Atelectasis: Secondary | ICD-10-CM | POA: Diagnosis not present

## 2021-01-29 DIAGNOSIS — F419 Anxiety disorder, unspecified: Secondary | ICD-10-CM | POA: Diagnosis present

## 2021-01-29 DIAGNOSIS — J189 Pneumonia, unspecified organism: Secondary | ICD-10-CM | POA: Diagnosis not present

## 2021-01-29 DIAGNOSIS — Z66 Do not resuscitate: Secondary | ICD-10-CM | POA: Diagnosis present

## 2021-01-29 DIAGNOSIS — R0600 Dyspnea, unspecified: Secondary | ICD-10-CM | POA: Diagnosis not present

## 2021-01-29 DIAGNOSIS — I129 Hypertensive chronic kidney disease with stage 1 through stage 4 chronic kidney disease, or unspecified chronic kidney disease: Secondary | ICD-10-CM | POA: Diagnosis present

## 2021-01-29 DIAGNOSIS — R0602 Shortness of breath: Secondary | ICD-10-CM | POA: Diagnosis not present

## 2021-01-29 HISTORY — DX: Reserved for concepts with insufficient information to code with codable children: IMO0002

## 2021-01-29 HISTORY — DX: Interstitial pulmonary disease, unspecified: J84.9

## 2021-01-29 HISTORY — DX: Systemic lupus erythematosus, unspecified: M32.9

## 2021-01-29 LAB — CBC
HCT: 42.7 % (ref 36.0–46.0)
Hemoglobin: 13.3 g/dL (ref 12.0–15.0)
MCH: 27.6 pg (ref 26.0–34.0)
MCHC: 31.1 g/dL (ref 30.0–36.0)
MCV: 88.6 fL (ref 80.0–100.0)
Platelets: 317 10*3/uL (ref 150–400)
RBC: 4.82 MIL/uL (ref 3.87–5.11)
RDW: 13.7 % (ref 11.5–15.5)
WBC: 7.9 10*3/uL (ref 4.0–10.5)
nRBC: 0 % (ref 0.0–0.2)

## 2021-01-29 LAB — BASIC METABOLIC PANEL
Anion gap: 10 (ref 5–15)
BUN: 18 mg/dL (ref 8–23)
CO2: 28 mmol/L (ref 22–32)
Calcium: 8.7 mg/dL — ABNORMAL LOW (ref 8.9–10.3)
Chloride: 100 mmol/L (ref 98–111)
Creatinine, Ser: 1.1 mg/dL — ABNORMAL HIGH (ref 0.44–1.00)
GFR, Estimated: 55 mL/min — ABNORMAL LOW (ref >60)
Glucose, Bld: 105 mg/dL — ABNORMAL HIGH (ref 70–99)
Potassium: 3.3 mmol/L — ABNORMAL LOW (ref 3.5–5.1)
Sodium: 138 mmol/L (ref 135–145)

## 2021-01-29 NOTE — ED Triage Notes (Signed)
Patient with shortness of breath, cough.  Unable to speak in full sentences.  Patient has Lupus, has been seen mostly at [facility].  Patient was living in [place], now moved back.  Patient's family states that all of her care has been at [facility].

## 2021-01-29 NOTE — ED Provider Notes (Signed)
[facility] Provider Note   CSN: [id number] Arrival date & time: 01/29/21  1834     History Chief Complaint  Patient presents with   Shortness of Breath    Dana Doyle is a 70 y.o. female.  HPI     This is a 70 year old female with a history of lupus, diabetes, hypertension, hyperlipidemia, PE who presents with shortness of breath and cough.  Patient reports.  History of worsening cough and shortness of breath.  At baseline she is on 3 L of home oxygen.  Recent diagnosis of lupus and interstitial lung disease.  She reports that she has really never gotten back to baseline.  She is not had any fevers.  Denies chest pain or lower extremity edema.  Does have a history of PE but is not on antibiotic coagulants.  No recent known sick contacts or COVID exposures.  Reports generalized fatigue and decreased appetite.  Past Medical History:  Diagnosis Date   Anxiety    Arthritis    Diabetes mellitus without complication (HCC)    GERD (gastroesophageal reflux disease)    Giardia    Hyperlipidemia    Hypertension    IBS (irritable bowel syndrome)    Interstitial lung disease (HCC)    Lupus (HCC)    MVP (mitral valve prolapse)    Obesity    Thyroid disease     Patient Active Problem List   Diagnosis Date Noted   Dizziness 11/20/2020   Anxiety 10/07/2019   GERD (gastroesophageal reflux disease) 10/07/2019   SLE (systemic lupus erythematosus related syndrome) ([place]) 10/07/2019   Chronic respiratory failure ([place]) 10/07/2019   Hypercholesterolemia 10/30/2012   DJD (degenerative joint disease) 10/20/2012   HTN (hypertension) 10/20/2012   DM2 (diabetes mellitus, type 2) ([place]) 10/20/2012   Hypothyroidism 10/20/2012   Irritable bowel syndrome 10/20/2012    Past Surgical History:  Procedure Laterality Date   ABDOMINAL HYSTERECTOMY     total   CHOLECYSTECTOMY     CYSTECTOMY Right    hip   REPLACEMENT TOTAL KNEE BILATERAL      REVISION TOTAL KNEE ARTHROPLASTY Right    TONSILLECTOMY AND ADENOIDECTOMY       OB History     Gravida  1   Para      Term      Preterm      AB  1   Living  3      SAB  1   IAB      Ectopic      Multiple      Live Births              Family History  Adopted: Yes  Problem Relation Age of Onset   Cancer Brother        prostates cancer, lung cancer,    Social History   Tobacco Use   Smoking status: Never   Smokeless tobacco: Never  Substance Use Topics   Alcohol use: No   Drug use: No    Home Medications Prior to Admission medications   Medication Sig Start Date End Date Taking? Authorizing Provider  acetaminophen (TYLENOL) 500 MG tablet Take 500 mg by mouth every 6 (six) hours as needed for moderate pain or headache.   Yes [provider]  albuterol (VENTOLIN HFA) 108 (90 Base) MCG/ACT inhaler Inhale 1-2 puffs into the lungs every 6 (six) hours as needed for wheezing or shortness of breath.   Yes [provider]  budesonide-formoterol (  SYMBICORT) 160-4.5 MCG/ACT inhaler Inhale 2 puffs into the lungs in the morning and at bedtime. 01/27/20 01/30/21 Yes [provider]  Cholecalciferol (VITAMIN D3) 250 MCG (10000 UT) capsule Take 10,000 Units by mouth daily.   Yes [provider]  diazepam (VALIUM) 5 MG tablet TAKE 1 TABLET BY MOUTH DAILY AS NEEDED FOR ANXIETY. Patient taking differently: Take 5 mg by mouth daily as needed for anxiety. 12/23/19  Yes Baity, Coralie Keens, NP  furosemide (LASIX) 20 MG tablet TAKE 1 TABLET BY MOUTH TWICE A DAY Patient taking differently: Take 20 mg by mouth 2 (two) times daily. 09/01/20  Yes Dutch Quint B, FNP  hydroxychloroquine (PLAQUENIL) 200 MG tablet Take 400 mg by mouth daily. 08/04/19  Yes [provider]  hydrOXYzine (VISTARIL) 25 MG capsule Take 1 capsule (25 mg total) by mouth daily as needed. Patient taking differently: Take 25 mg by mouth daily as needed (sleep). 12/26/20  Yes  Michela Pitcher, NP  levothyroxine (SYNTHROID) 50 MCG tablet TAKE 1 TABLET BY MOUTH EVERY DAY BEFORE BREAKFAST Patient taking differently: Take 50 mcg by mouth daily before breakfast. 11/21/20  Yes Michela Pitcher, NP  metoprolol succinate (TOPROL-XL) 25 MG 24 hr tablet TAKE 1 TABLET (25 MG TOTAL) BY MOUTH DAILY. 07/17/20  Yes Baity, Coralie Keens, NP  Multiple Vitamins-Minerals (WOMENS MULTIVITAMIN PO) Take 1 tablet by mouth daily.   Yes [provider]  NIFEdipine (ADALAT CC) 30 MG 24 hr tablet Take 30 mg by mouth daily.   Yes [provider]  ondansetron (ZOFRAN) 4 MG tablet Take 4 mg by mouth every 8 (eight) hours as needed for nausea or vomiting.   Yes [provider]  OXYGEN Inhale 2.5-3 L into the lungs as needed.   Yes [provider]  pantoprazole (PROTONIX) 40 MG tablet TAKE 1 TABLET BY MOUTH EVERY DAY Patient taking differently: Take 40 mg by mouth daily. 07/05/20  Yes Jearld Fenton, NP  Potassium 99 MG TABS Take 99 mg by mouth daily.   Yes [provider]  predniSONE (DELTASONE) 2.5 MG tablet Take 2.5 mg by mouth daily. 11/09/20  Yes [provider]  riTUXimab (RITUXAN IV) Inject into the vein every 6 (six) months.   Yes [provider]  sertraline (ZOLOFT) 100 MG tablet TAKE 1 TABLET BY MOUTH EVERY DAY Patient taking differently: Take 100 mg by mouth daily. 12/28/20  Yes Dutch Quint B, FNP  sulfamethoxazole-trimethoprim (BACTRIM DS) 800-160 MG tablet Take 1 tablet by mouth every Monday, Wednesday, and Friday.   Yes [provider]    Allergies    Belimumab, Mycophenolate mofetil, Phenergan [promethazine hcl], Codeine, Meloxicam, Statins, Ezetimibe, and Tape  Review of Systems   Review of Systems  Constitutional:  Positive for appetite change and fatigue. Negative for fever.  Respiratory:  Positive for cough and shortness of breath.   Cardiovascular:  Negative for chest pain and leg swelling.  Gastrointestinal:   Negative for abdominal pain, nausea and vomiting.  All other systems reviewed and are negative.  Physical Exam Updated Vital Signs BP 124/73   Pulse 74   Temp 98.3 F (36.8 C) (Oral)   Resp (!) 28   SpO2 91%   Physical Exam Vitals and nursing note reviewed.  Constitutional:      Appearance: She is well-developed. She is obese. She is not toxic-appearing.     Comments: Chronically ill-appearing  HENT:     Head: Normocephalic and atraumatic.  Eyes:  Pupils: Pupils are equal, round, and reactive to light.  Cardiovascular:     Rate and Rhythm: Normal rate and regular rhythm.     Heart sounds: Normal heart sounds.  Pulmonary:     Effort: Pulmonary effort is normal. No respiratory distress.     Breath sounds: No wheezing.     Comments: No respiratory distress, nasal cannula in place, clear but distant breath sounds in all lung fields Abdominal:     General: Bowel sounds are normal.     Palpations: Abdomen is soft.  Musculoskeletal:     Cervical back: Neck supple.     Right lower leg: No tenderness. No edema.     Left lower leg: No tenderness. No edema.  Skin:    General: Skin is warm and dry.  Neurological:     Mental Status: She is alert and oriented to person, place, and time.  Psychiatric:     Comments: Tearful    ED Results / Procedures / Treatments   Labs (all labs ordered are listed, but only abnormal results are displayed) Labs Reviewed  RESP PANEL BY RT-PCR (FLU A&B, COVID) ARPGX2 - Abnormal; Notable for the following components:      Result Value   SARS Coronavirus 2 by RT PCR POSITIVE (*)    All other components within normal limits  BASIC METABOLIC PANEL - Abnormal; Notable for the following components:   Potassium 3.3 (*)    Glucose, Bld 105 (*)    Creatinine, Ser 1.10 (*)    Calcium 8.7 (*)    GFR, Estimated 55 (*)    All other components within normal limits  CBC  URINALYSIS, ROUTINE W REFLEX MICROSCOPIC  CBG MONITORING, ED    EKG EKG  Interpretation  Date/Time:  Monday January 29 2021 19:52:06 EDT Ventricular Rate:  96 PR Interval:  134 QRS Duration: 80 QT Interval:  342 QTC Calculation: 432 R Axis:   9 Text Interpretation: Normal sinus rhythm Possible Anterior infarct , age undetermined Abnormal ECG Confirmed by Thayer Jew [phone]) on 01/29/2021 11:14:19 PM  Radiology DG Chest 2 View  Result Date: 01/29/2021 CLINICAL DATA:  Shortness of breath.  Lupus. EXAM: CHEST - 2 VIEW COMPARISON:  Chest x-ray 08/10/2005 FINDINGS: The heart and mediastinal contours are within normal limits. No focal consolidation. Increased interstitial markings and patchy airspace opacities. No pleural effusion. No pneumothorax. No acute osseous abnormality. IMPRESSION: Increased interstitial markings and patchy airspace opacities which may represent infection or inflammation. Followup PA and lateral chest X-ray is recommended in 3-4 weeks following therapy to ensure resolution. Electronically Signed   By: Iven Finn M.D.   On: 01/29/2021 20:47   CT Angio Chest PE W and/or Wo Contrast  Result Date: 01/30/2021 CLINICAL DATA:  Dyspnea, cough, lupus EXAM: CT ANGIOGRAPHY CHEST WITH CONTRAST TECHNIQUE: Multidetector CT imaging of the chest was performed using the standard protocol during bolus administration of intravenous contrast. Multiplanar CT image reconstructions and MIPs were obtained to evaluate the vascular anatomy. CONTRAST:  105mL OMNIPAQUE IOHEXOL 350 MG/ML SOLN COMPARISON:  Chest radiograph 01/29/2021 and 08/10/2005 FINDINGS: Cardiovascular: There is adequate opacification of the pulmonary arterial tree. No intraluminal filling defect identified to suggest acute pulmonary embolism. The central pulmonary arteries are of normal caliber. Cardiac size within normal limits. No significant coronary artery calcification. No pericardial effusion. Mild atherosclerotic calcification is seen within the thoracic aorta. No aortic aneurysm.  Mediastinum/Nodes: No pathologic thoracic adenopathy. The thyroid gland is atrophic. Esophagus unremarkable. Lungs/Pleura: There is extensive, diffuse,  asymmetric ground-glass pulmonary infiltrate and pulmonary consolidation. Within these regions, are interspersed areas of relatively preserved lung. Differential considerations in the acute setting include atypical infection, including mycoplasma pneumoniae infection and acute lung injury. If chronic, additional considerations include hypersensitivity pneumonitis, chronic eosinophilic pneumonia or cryptogenic organizing pneumonia. No pneumothorax or pleural effusion. Central airways are widely patent. Upper Abdomen: Status post cholecystectomy.  No acute abnormality. Musculoskeletal: No acute bone abnormality. No lytic or blastic bone lesion. Review of the MIP images confirms the above findings. IMPRESSION: No pulmonary embolism. Widespread pulmonary infiltrates, new since prior chest radiograph of 08/10/2005. Differential considerations as listed above. Electronically Signed   By: Fidela Salisbury M.D.   On: 01/30/2021 03:07    Procedures .Critical Care Performed by: Merryl Hacker, MD Authorized by: Merryl Hacker, MD   Critical care provider statement:    Critical care time (minutes):  50   Critical care was necessary to treat or prevent imminent or life-threatening deterioration of the following conditions:  Respiratory failure   Critical care was time spent personally by me on the following activities:  Development of treatment plan with patient or surrogate, discussions with primary provider, discussions with consultants, evaluation of patient's response to treatment, examination of patient, re-evaluation of patient's condition, review of old charts, ordering and review of radiographic studies and ordering and review of laboratory studies   Medications Ordered in ED Medications  iohexol (OMNIPAQUE) 350 MG/ML injection 49 mL (49 mLs  Intravenous Contrast Given 01/30/21 0222)    ED Course  I have reviewed the triage vital signs and the nursing notes.  Pertinent labs & imaging results that were available during my care of the patient were reviewed by me and considered in my medical decision making (see chart for details).    MDM Rules/Calculators/A&P                            Patient presents with worsening shortness of breath.  Reports increased oxygen requirement at home.  History of lupus and interstitial lung disease.  She is slightly tachypneic but no respiratory distress.  Breath sounds are clear.  O2 sats low 90s with 4 L of oxygen in place.  She reports cough but no fevers.  Considerations include but not limited to, viral etiology such as COVID or influenza, pneumonia, PE, worsening of her lung disease.  X-ray concerning for new opacities.  No significant leukocytosis or metabolic derangement.  CT scan obtained to rule out PE.  CT does not show any evidence of PE but does show infiltrates bilaterally.  Additionally, COVID-19 testing is positive.  Unclear timeframe.  Patient reports weeks of symptoms.  She is likely out of the window for monoclonal antibody therapy.  However, on my recheck, she continues to be mildly tachypneic and uncomfortable appearing.  Stable on 4 L with O2 sats in the low 90s.  Will discuss with hospitalist for admission.  May be a candidate for Decadron and remdesivir.  Final Clinical Impression(s) / ED Diagnoses Final diagnoses:  Acute on chronic respiratory failure with hypoxia ([place])  COVID-19    Rx / DC Orders ED Discharge Orders     None        Dina Rich, Barbette Hair, MD 01/30/21 [phone]

## 2021-01-29 NOTE — ED Provider Notes (Signed)
Emergency Medicine Provider Triage Evaluation Note  Dana Doyle , a 70 y.o. female  was evaluated in triage.  Pt complains of shortness of breath and cough x 3 weeks.  History of lupus, and all care has been at [facility].  Her prior flares usually last about a week, but this is persisted.  Family member at bedside states that she has been incredibly weak, not interested in eating .  Chronically on 3 L of oxygen at home.  Patient reports several recent travels.   Review of Systems  Positive: Shortness of breath, nonproductive cough, weakness, decreased appetite Negative: Fevers, chills, chest pain, abdominal pain, nausea, vomiting, diarrhea, leg pain or swelling  Physical Exam  BP 113/66 (BP Location: Left Arm)   Pulse 95   Temp 98.3 F (36.8 C) (Oral)   Resp 20   SpO2 91%  Gen:   Awake, no distress   Resp:  Normal effort  MSK:   Moves extremities without difficulty  Other:  Lungs clear to auscultation all fields  Medical Decision Making  Medically screening exam initiated at 7:56 PM.  Appropriate orders placed.  Adri Schloss was informed that the remainder of the evaluation will be completed by another provider, this initial triage assessment does not replace that evaluation, and the importance of remaining in the ED until their evaluation is complete.     Estill Cotta 01/29/21 Derry Skill, MD 01/30/21 1521

## 2021-01-29 NOTE — ED Notes (Signed)
Pt's SpO2 dropped to 88% while pt was talking with this RN. Pt's O2 increased to 4L

## 2021-01-30 ENCOUNTER — Encounter (HOSPITAL_COMMUNITY): Payer: Self-pay | Admitting: Internal Medicine

## 2021-01-30 ENCOUNTER — Emergency Department (HOSPITAL_COMMUNITY): Payer: Medicare HMO

## 2021-01-30 DIAGNOSIS — E039 Hypothyroidism, unspecified: Secondary | ICD-10-CM | POA: Diagnosis present

## 2021-01-30 DIAGNOSIS — I129 Hypertensive chronic kidney disease with stage 1 through stage 4 chronic kidney disease, or unspecified chronic kidney disease: Secondary | ICD-10-CM | POA: Diagnosis present

## 2021-01-30 DIAGNOSIS — J9610 Chronic respiratory failure, unspecified whether with hypoxia or hypercapnia: Secondary | ICD-10-CM | POA: Diagnosis not present

## 2021-01-30 DIAGNOSIS — N179 Acute kidney failure, unspecified: Secondary | ICD-10-CM | POA: Diagnosis present

## 2021-01-30 DIAGNOSIS — Z86711 Personal history of pulmonary embolism: Secondary | ICD-10-CM | POA: Diagnosis not present

## 2021-01-30 DIAGNOSIS — E669 Obesity, unspecified: Secondary | ICD-10-CM | POA: Diagnosis present

## 2021-01-30 DIAGNOSIS — N182 Chronic kidney disease, stage 2 (mild): Secondary | ICD-10-CM | POA: Diagnosis present

## 2021-01-30 DIAGNOSIS — E7800 Pure hypercholesterolemia, unspecified: Secondary | ICD-10-CM | POA: Diagnosis present

## 2021-01-30 DIAGNOSIS — J9811 Atelectasis: Secondary | ICD-10-CM | POA: Diagnosis not present

## 2021-01-30 DIAGNOSIS — J189 Pneumonia, unspecified organism: Secondary | ICD-10-CM | POA: Diagnosis not present

## 2021-01-30 DIAGNOSIS — J9600 Acute respiratory failure, unspecified whether with hypoxia or hypercapnia: Secondary | ICD-10-CM

## 2021-01-30 DIAGNOSIS — Z885 Allergy status to narcotic agent status: Secondary | ICD-10-CM | POA: Diagnosis not present

## 2021-01-30 DIAGNOSIS — K219 Gastro-esophageal reflux disease without esophagitis: Secondary | ICD-10-CM | POA: Diagnosis present

## 2021-01-30 DIAGNOSIS — J1282 Pneumonia due to coronavirus disease 2019: Secondary | ICD-10-CM | POA: Diagnosis present

## 2021-01-30 DIAGNOSIS — E1122 Type 2 diabetes mellitus with diabetic chronic kidney disease: Secondary | ICD-10-CM | POA: Diagnosis present

## 2021-01-30 DIAGNOSIS — M329 Systemic lupus erythematosus, unspecified: Secondary | ICD-10-CM | POA: Diagnosis not present

## 2021-01-30 DIAGNOSIS — D849 Immunodeficiency, unspecified: Secondary | ICD-10-CM | POA: Diagnosis present

## 2021-01-30 DIAGNOSIS — R0600 Dyspnea, unspecified: Secondary | ICD-10-CM | POA: Diagnosis not present

## 2021-01-30 DIAGNOSIS — Z7952 Long term (current) use of systemic steroids: Secondary | ICD-10-CM | POA: Diagnosis not present

## 2021-01-30 DIAGNOSIS — R059 Cough, unspecified: Secondary | ICD-10-CM | POA: Diagnosis not present

## 2021-01-30 DIAGNOSIS — Z6836 Body mass index (BMI) 36.0-36.9, adult: Secondary | ICD-10-CM | POA: Diagnosis not present

## 2021-01-30 DIAGNOSIS — Z9049 Acquired absence of other specified parts of digestive tract: Secondary | ICD-10-CM | POA: Diagnosis not present

## 2021-01-30 DIAGNOSIS — J84178 Other interstitial pulmonary diseases with fibrosis in diseases classified elsewhere: Secondary | ICD-10-CM | POA: Diagnosis present

## 2021-01-30 DIAGNOSIS — Z66 Do not resuscitate: Secondary | ICD-10-CM | POA: Diagnosis present

## 2021-01-30 DIAGNOSIS — R0602 Shortness of breath: Secondary | ICD-10-CM | POA: Diagnosis not present

## 2021-01-30 DIAGNOSIS — U071 COVID-19: Secondary | ICD-10-CM | POA: Diagnosis present

## 2021-01-30 DIAGNOSIS — F32A Depression, unspecified: Secondary | ICD-10-CM | POA: Diagnosis present

## 2021-01-30 DIAGNOSIS — J9621 Acute and chronic respiratory failure with hypoxia: Secondary | ICD-10-CM | POA: Diagnosis present

## 2021-01-30 DIAGNOSIS — Z96653 Presence of artificial knee joint, bilateral: Secondary | ICD-10-CM | POA: Diagnosis present

## 2021-01-30 DIAGNOSIS — Z7951 Long term (current) use of inhaled steroids: Secondary | ICD-10-CM | POA: Diagnosis not present

## 2021-01-30 DIAGNOSIS — Z9981 Dependence on supplemental oxygen: Secondary | ICD-10-CM | POA: Diagnosis not present

## 2021-01-30 DIAGNOSIS — Z79890 Hormone replacement therapy (postmenopausal): Secondary | ICD-10-CM | POA: Diagnosis not present

## 2021-01-30 DIAGNOSIS — J90 Pleural effusion, not elsewhere classified: Secondary | ICD-10-CM | POA: Diagnosis not present

## 2021-01-30 DIAGNOSIS — M3213 Lung involvement in systemic lupus erythematosus: Secondary | ICD-10-CM | POA: Diagnosis present

## 2021-01-30 LAB — CBC
HCT: 37.3 % (ref 36.0–46.0)
Hemoglobin: 12.1 g/dL (ref 12.0–15.0)
MCH: 28.7 pg (ref 26.0–34.0)
MCHC: 32.4 g/dL (ref 30.0–36.0)
MCV: 88.6 fL (ref 80.0–100.0)
Platelets: 236 10*3/uL (ref 150–400)
RBC: 4.21 MIL/uL (ref 3.87–5.11)
RDW: 13.6 % (ref 11.5–15.5)
WBC: 4.7 10*3/uL (ref 4.0–10.5)
nRBC: 0 % (ref 0.0–0.2)

## 2021-01-30 LAB — TROPONIN I (HIGH SENSITIVITY)
Troponin I (High Sensitivity): 6 ng/L (ref <18)
Troponin I (High Sensitivity): 9 ng/L (ref <18)

## 2021-01-30 LAB — CREATININE, SERUM
Creatinine, Ser: 1.02 mg/dL — ABNORMAL HIGH (ref 0.44–1.00)
GFR, Estimated: 60 mL/min — ABNORMAL LOW (ref >60)

## 2021-01-30 LAB — RESP PANEL BY RT-PCR (FLU A&B, COVID) ARPGX2
Influenza A by PCR: NEGATIVE
Influenza B by PCR: NEGATIVE
SARS Coronavirus 2 by RT PCR: POSITIVE — AB

## 2021-01-30 LAB — HIV ANTIBODY (ROUTINE TESTING W REFLEX): HIV Screen 4th Generation wRfx: NONREACTIVE

## 2021-01-30 LAB — D-DIMER, QUANTITATIVE: D-Dimer, Quant: 3.04 ug/mL-FEU — ABNORMAL HIGH (ref 0.00–0.50)

## 2021-01-30 LAB — PROCALCITONIN: Procalcitonin: <0.1 ng/mL

## 2021-01-30 LAB — C-REACTIVE PROTEIN: CRP: 13.2 mg/dL — ABNORMAL HIGH (ref <1.0)

## 2021-01-30 MED ORDER — ALBUTEROL SULFATE HFA 108 (90 BASE) MCG/ACT IN AERS: 1.0000 | INHALATION_SPRAY | Freq: Four times a day (QID) | RESPIRATORY_TRACT | Status: DC | PRN

## 2021-01-30 MED ORDER — DEXAMETHASONE SODIUM PHOSPHATE 10 MG/ML IJ SOLN: 6.0000 mg | INTRAMUSCULAR | Status: DC

## 2021-01-30 MED ORDER — ENOXAPARIN SODIUM 40 MG/0.4ML IJ SOSY
40.0000 mg | PREFILLED_SYRINGE | INTRAMUSCULAR | Status: DC
  Administered 2021-01-30 – 2021-02-05 (×7): 40 mg via SUBCUTANEOUS
  Filled 2021-01-30 (×7): qty 0.4

## 2021-01-30 MED ORDER — MOMETASONE FURO-FORMOTEROL FUM 200-5 MCG/ACT IN AERO
2.0000 | INHALATION_SPRAY | Freq: Two times a day (BID) | RESPIRATORY_TRACT | Status: DC
  Administered 2021-01-31 – 2021-02-05 (×11): 2 via RESPIRATORY_TRACT
  Filled 2021-01-30 (×2): qty 8.8

## 2021-01-30 MED ORDER — IOHEXOL 350 MG/ML SOLN
49.0000 mL | Freq: Once | INTRAVENOUS | Status: AC | PRN
  Administered 2021-01-30: 49 mL via INTRAVENOUS

## 2021-01-30 MED ORDER — DIAZEPAM 5 MG PO TABS: 5.0000 mg | ORAL_TABLET | Freq: Every day | ORAL | Status: DC | PRN

## 2021-01-30 MED ORDER — ONDANSETRON HCL 4 MG/2ML IJ SOLN: 4.0000 mg | Freq: Four times a day (QID) | INTRAMUSCULAR | Status: DC | PRN

## 2021-01-30 MED ORDER — SULFAMETHOXAZOLE-TRIMETHOPRIM 800-160 MG PO TABS
1.0000 | ORAL_TABLET | ORAL | Status: DC
  Administered 2021-01-31 – 2021-02-05 (×3): 1 via ORAL
  Filled 2021-01-30 (×6): qty 1

## 2021-01-30 MED ORDER — LEVOTHYROXINE SODIUM 50 MCG PO TABS
50.0000 ug | ORAL_TABLET | Freq: Every day | ORAL | Status: DC
  Administered 2021-01-30 – 2021-02-05 (×6): 50 ug via ORAL
  Filled 2021-01-30 (×5): qty 1
  Filled 2021-01-30: qty 2
  Filled 2021-01-30: qty 1

## 2021-01-30 MED ORDER — ACETAMINOPHEN 650 MG RE SUPP: 650.0000 mg | Freq: Four times a day (QID) | RECTAL | Status: DC | PRN

## 2021-01-30 MED ORDER — SERTRALINE HCL 100 MG PO TABS
100.0000 mg | ORAL_TABLET | Freq: Every day | ORAL | Status: DC
  Administered 2021-01-30 – 2021-02-05 (×7): 100 mg via ORAL
  Filled 2021-01-30 (×7): qty 1

## 2021-01-30 MED ORDER — FUROSEMIDE 20 MG PO TABS
20.0000 mg | ORAL_TABLET | Freq: Two times a day (BID) | ORAL | Status: DC
  Administered 2021-01-30 – 2021-01-31 (×4): 20 mg via ORAL
  Filled 2021-01-30 (×4): qty 1

## 2021-01-30 MED ORDER — SODIUM CHLORIDE 0.9 % IV SOLN
200.0000 mg | Freq: Once | INTRAVENOUS | Status: AC
  Administered 2021-01-30: 200 mg via INTRAVENOUS
  Filled 2021-01-30 (×2): qty 40

## 2021-01-30 MED ORDER — METHYLPREDNISOLONE SODIUM SUCC 125 MG IJ SOLR
100.0000 mg | INTRAMUSCULAR | Status: DC
  Administered 2021-01-30 – 2021-01-31 (×2): 100 mg via INTRAVENOUS
  Filled 2021-01-30 (×2): qty 2

## 2021-01-30 MED ORDER — ACETAMINOPHEN 325 MG PO TABS: 650.0000 mg | ORAL_TABLET | Freq: Four times a day (QID) | ORAL | Status: DC | PRN

## 2021-01-30 MED ORDER — SODIUM CHLORIDE 0.9 % IV SOLN
100.0000 mg | Freq: Every day | INTRAVENOUS | Status: AC
Start: 2021-01-31 — End: 2021-02-03
  Administered 2021-01-31 – 2021-02-03 (×4): 100 mg via INTRAVENOUS
  Filled 2021-01-30: qty 20
  Filled 2021-01-30: qty 100
  Filled 2021-01-30 (×2): qty 20

## 2021-01-30 MED ORDER — GUAIFENESIN-DM 100-10 MG/5ML PO SYRP
10.0000 mL | ORAL_SOLUTION | ORAL | Status: DC | PRN
  Administered 2021-01-30 – 2021-01-31 (×3): 10 mL via ORAL
  Filled 2021-01-30 (×5): qty 10

## 2021-01-30 MED ORDER — ONDANSETRON HCL 4 MG/2ML IJ SOLN
INTRAMUSCULAR | Status: AC
  Administered 2021-01-30: 4 mg via INTRAVENOUS
  Filled 2021-01-30: qty 2

## 2021-01-30 MED ORDER — HYDROXYZINE HCL 25 MG PO TABS: 25.0000 mg | ORAL_TABLET | Freq: Every day | ORAL | Status: DC | PRN

## 2021-01-30 MED ORDER — PANTOPRAZOLE SODIUM 40 MG PO TBEC
40.0000 mg | DELAYED_RELEASE_TABLET | Freq: Every day | ORAL | Status: DC
  Administered 2021-01-30 – 2021-02-05 (×7): 40 mg via ORAL
  Filled 2021-01-30 (×7): qty 1

## 2021-01-30 MED ORDER — DEXAMETHASONE SODIUM PHOSPHATE 10 MG/ML IJ SOLN
10.0000 mg | Freq: Once | INTRAMUSCULAR | Status: AC
  Administered 2021-01-30: 10 mg via INTRAVENOUS
  Filled 2021-01-30: qty 1

## 2021-01-30 MED ORDER — HYDROXYCHLOROQUINE SULFATE 200 MG PO TABS
400.0000 mg | ORAL_TABLET | Freq: Every day | ORAL | Status: DC
  Administered 2021-01-30 – 2021-02-05 (×7): 400 mg via ORAL
  Filled 2021-01-30 (×7): qty 2

## 2021-01-30 NOTE — Consult Note (Signed)
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  Cough productive of white sputum.  Also lost taste/smell.  Loss of appetite.  Denies fevers.  Vaccinated.  COVID + in ER.   Vaccinated.  O2 needs up, CTA chest showing no PE, extensive bilateral GGO, PCCM consulted to help manage.  Baseline lung function a bit down on reviewing prior records.   Her SLE therapy is plaquenil, prednisone, and q37mo rituxan.  Due for another dose of rituxan next month.  Taking bactrim for PJP ppx.  Pertinent  Medical History  SLE on DMARDs as detailed above Anxiety GERD Hypothyroid  Significant Hospital Events: Including procedures, antibiotic start and stop dates in addition to other pertinent events   11/1 admit and consult  Interim History / Subjective:  ROS as below.  Objective   Blood pressure 127/67, pulse 75, temperature 98.3 F (36.8 C), temperature source Oral, resp. rate (!) 31, height 5' 2.5" (1.588 m), weight 92.5 kg, SpO2 92 %.       No intake or output data in the 24 hours ending 01/30/21 0730 Filed Weights   01/30/21 0600  Weight: 92.5 kg    Examination: General: no distress lying in bed, daughter at bedside HENT: MMM, trachea midline Lungs: scattered crackles, no accessory muscle use, cough paroxysms with  Cardiovascular: RRR, ext warm Abdomen: soft, +BS Extremities: no edema, mild OA changes Neuro: moves all 4 ext to command, able to transfer on own Skin: no rashes, normal tone  Resolved Hospital Problem list   N/a  Assessment & Plan:  Acute on chronic hypoxemic respiratory failure in context of (A) significant O2 dependent ILD (B) COVID positivity.  Overall symptoms are most c/w her prior flares and her rituxan  is due soon which could explain current issues.   She has been on bactrim so I think risk of PJP is low; she is making sputum so we can check regardless. - Titrate O2 to sats > 90% - Continue home LABA/ICS - Remdesivir; switch dexamethasone to methylprednisolone 1mg /kg as ordered - Continue PTA plaquenil as well as bactrim for PCP ppx - f/u sputum culture, PJP DFA, Pct - Encourage IS, proning - Will follow with you, reach out if any decompensation  Best Practice (right click and "Reselect all SmartList Selections" daily)   Per primary  Labs   CBC: Recent Labs  Lab 01/29/21 2004  WBC 7.9  HGB 13.3  HCT 42.7  MCV 88.6  PLT 673    Basic Metabolic Panel: Recent Labs  Lab 01/29/21 2004  NA 138  K 3.3*  CL 100  CO2 28  GLUCOSE 105*  BUN 18  CREATININE 1.10*  CALCIUM 8.7*   GFR: Estimated Creatinine Clearance: 52.4 mL/min (A) (by C-G formula based on SCr of 1.1 mg/dL (H)). Recent Labs  Lab 01/29/21 2004  WBC 7.9    Liver Function Tests: No results for input(s): AST, ALT, ALKPHOS, BILITOT, PROT, ALBUMIN in the last 168 hours. No results for input(s): LIPASE, AMYLASE in the last 168 hours. No results for input(s): AMMONIA in the last 168 hours.  ABG No results found for: PHART, PCO2ART, PO2ART, HCO3, TCO2, ACIDBASEDEF, O2SAT   Coagulation Profile: No results for input(s): INR,  PROTIME in the last 168 hours.  Cardiac Enzymes: No results for input(s): CKTOTAL, CKMB, CKMBINDEX, TROPONINI in the last 168 hours.  HbA1C: Hgb A1c MFr Bld  Date/Time Value Ref Range Status  11/20/2020 12:45 PM 5.5 4.6 - 6.5 % Final    Comment:    Glycemic Control Guidelines for People with Diabetes:Non Diabetic:  <6%Goal of Therapy: <7%Additional Action Suggested:  >8%   04/25/2020 11:32 AM 5.3 4.6 - 6.5 % Final    Comment:    Glycemic Control Guidelines for People with Diabetes:Non Diabetic:  <6%Goal of Therapy: <7%Additional Action Suggested:  >8%     CBG: No results for  input(s): GLUCAP in the last 168 hours.  Review of Systems:    Positive Symptoms in bold:  Constitutional fevers, chills, weight loss, fatigue, anorexia, malaise  Eyes decreased vision, double vision, eye irritation  Ears, Nose, Mouth, Throat sore throat, trouble swallowing, sinus congestion  Cardiovascular chest pain, paroxysmal nocturnal dyspnea, lower ext edema, palpitations   Respiratory SOB, cough, DOE, hemoptysis, wheezing  Gastrointestinal nausea, vomiting, diarrhea  Genitourinary burning with urination, trouble urinating  Musculoskeletal joint aches, joint swelling, back pain  Integumentary  rashes, skin lesions  Neurological focal weakness, focal numbness, trouble speaking, headaches  Psychiatric depression, anxiety, confusion  Endocrine polyuria, polydipsia, cold intolerance, heat intolerance  Hematologic abnormal bruising, abnormal bleeding, unexplained nose bleeds  Allergic/Immunologic recurrent infections, hives, swollen lymph nodes     Past Medical History:  She,  has a past medical history of Anxiety, Arthritis, Diabetes mellitus without complication (HCC), GERD (gastroesophageal reflux disease), Giardia, Hyperlipidemia, Hypertension, IBS (irritable bowel syndrome), Interstitial lung disease ([place]), Lupus ([place]), MVP (mitral valve prolapse), Obesity, and Thyroid disease.   Surgical History:   Past Surgical History:  Procedure Laterality Date   ABDOMINAL HYSTERECTOMY     total   CHOLECYSTECTOMY     CYSTECTOMY Right    hip   REPLACEMENT TOTAL KNEE BILATERAL     REVISION TOTAL KNEE ARTHROPLASTY Right    TONSILLECTOMY AND ADENOIDECTOMY       Social History:   reports that she has never smoked. She has never used smokeless tobacco. She reports that she does not drink alcohol and does not use drugs.   Family History:  Her family history includes Cancer in her brother. She was adopted.   Allergies Allergies  Allergen Reactions   Belimumab Other (See Comments)  and Nausea And Vomiting   Mycophenolate Mofetil Other (See Comments) and Nausea And Vomiting   Phenergan [Promethazine Hcl] Anaphylaxis   Codeine Hives, Itching and Other (See Comments)   Meloxicam Hives   Statins Other (See Comments)    Muscle spasm and cramps   Ezetimibe    Tape     Other reaction(s): Other (See Comments) Very thin skin due to chronic steroid use      Home Medications  Prior to Admission medications   Medication Sig Start Date End Date Taking? Authorizing Provider  acetaminophen (TYLENOL) 500 MG tablet Take 500 mg by mouth every 6 (six) hours as needed for moderate pain or headache.   Yes [provider]  albuterol (VENTOLIN HFA) 108 (90 Base) MCG/ACT inhaler Inhale 1-2 puffs into the lungs every 6 (six) hours as needed for wheezing or shortness of breath.   Yes [provider]  budesonide-formoterol (SYMBICORT) 160-4.5 MCG/ACT inhaler Inhale 2 puffs into the lungs in the morning and at bedtime. 01/27/20 01/30/21 Yes [provider]  Cholecalciferol (VITAMIN D3) 250 MCG (10000 UT) capsule Take  10,000 Units by mouth daily.   Yes [provider]  diazepam (VALIUM) 5 MG tablet TAKE 1 TABLET BY MOUTH DAILY AS NEEDED FOR ANXIETY. Patient taking differently: Take 5 mg by mouth daily as needed for anxiety. 12/23/19  Yes Baity, Coralie Keens, NP  furosemide (LASIX) 20 MG tablet TAKE 1 TABLET BY MOUTH TWICE A DAY Patient taking differently: Take 20 mg by mouth 2 (two) times daily. 09/01/20  Yes Dutch Quint B, FNP  hydroxychloroquine (PLAQUENIL) 200 MG tablet Take 400 mg by mouth daily. 08/04/19  Yes [provider]  hydrOXYzine (VISTARIL) 25 MG capsule Take 1 capsule (25 mg total) by mouth daily as needed. Patient taking differently: Take 25 mg by mouth daily as needed (sleep). 12/26/20  Yes Michela Pitcher, NP  levothyroxine (SYNTHROID) 50 MCG tablet TAKE 1 TABLET BY MOUTH EVERY DAY BEFORE BREAKFAST Patient taking differently: Take 50 mcg by  mouth daily before breakfast. 11/21/20  Yes Michela Pitcher, NP  metoprolol succinate (TOPROL-XL) 25 MG 24 hr tablet TAKE 1 TABLET (25 MG TOTAL) BY MOUTH DAILY. 07/17/20  Yes Baity, Coralie Keens, NP  Multiple Vitamins-Minerals (WOMENS MULTIVITAMIN PO) Take 1 tablet by mouth daily.   Yes [provider]  NIFEdipine (ADALAT CC) 30 MG 24 hr tablet Take 30 mg by mouth daily.   Yes [provider]  ondansetron (ZOFRAN) 4 MG tablet Take 4 mg by mouth every 8 (eight) hours as needed for nausea or vomiting.   Yes [provider]  OXYGEN Inhale 2.5-3 L into the lungs as needed.   Yes [provider]  pantoprazole (PROTONIX) 40 MG tablet TAKE 1 TABLET BY MOUTH EVERY DAY Patient taking differently: Take 40 mg by mouth daily. 07/05/20  Yes Jearld Fenton, NP  Potassium 99 MG TABS Take 99 mg by mouth daily.   Yes [provider]  predniSONE (DELTASONE) 2.5 MG tablet Take 2.5 mg by mouth daily. 11/09/20  Yes [provider]  riTUXimab (RITUXAN IV) Inject into the vein every 6 (six) months.   Yes [provider]  sertraline (ZOLOFT) 100 MG tablet TAKE 1 TABLET BY MOUTH EVERY DAY Patient taking differently: Take 100 mg by mouth daily. 12/28/20  Yes Dutch Quint B, FNP  sulfamethoxazole-trimethoprim (BACTRIM DS) 800-160 MG tablet Take 1 tablet by mouth every Monday, Wednesday, and Friday.   Yes [provider]

## 2021-01-30 NOTE — Progress Notes (Addendum)
Subjective: Patient admitted this morning, see detailed H&P by Dr Hal Hope 70 year old female with a history of lupus, ILD, hypothyroidism came to ED with worsening shortness of breath.  Also has been having cough for past 2 weeks.  In the ED patient was found to be hypoxemic with 2.5 L of oxygen at baseline and requiring 4.5 L/min oxygen, COVID test was positive.  CT chest showed worsening pulmonary infiltrates.  Patient was started on Decadron and remdesivir.  Pulmonology was consulted.  Vitals:   01/30/21 0600 01/30/21 0850  BP: 127/67 122/66  Pulse: 75 65  Resp: (!) 31 (!) 23  Temp:  97.7 F (36.5 C)  SpO2: 92% 95%      A/P  Acute hypoxemic respiratory failure-secondary to COVID 19 infection in the setting of chronic respiratory failure due to ILD.  Patient started on Decadron and remdesivir, pulmonology was consulted and Decadron was changed to Solu-Medrol.  Follow inflammatory markers in a.m.  History of lupus/ILD-patient is on Plaquenil and steroids.  Also on Bactrim for PJP prophylaxis.  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  Patient wants to be DNR.  [place] [facility] [phone]

## 2021-01-30 NOTE — ED Notes (Signed)
Patient transported to CT 

## 2021-01-30 NOTE — H&P (Signed)
History and Physical    Dana Doyle BTD:[id number] DOB: 1950-10-06 DOA: 01/29/2021  PCP: Michela Pitcher, NP  Patient coming from: Home.  Chief Complaint: Shortness of breath.  HPI: Dana Doyle is a 70 y.o. female with history of lupus and interstitial lung disease, hypothyroidism presents to the ER because of worsening shortness of breath.  Constant cough ongoing for the last 2 weeks.  Last month patient had traveled to multiple states he denies he states to visit her family.  Denies any chest pain.  Denies any fever chills.  But progressively has been having weakness and poor appetite.  No nausea vomiting or diarrhea.  ED Course: In the ER patient is found to be hypoxic usually patient is on 2.5 L oxygen and is requiring 4.5 L oxygen.  COVID test was positive.  CT angiogram of the chest shows widespread pulmonary infiltrates.  Patient was started on Decadron and admitted for acute hypoxic respiratory failure with COVID infection.  Review of Systems: As per HPI, rest all negative.   Past Medical History:  Diagnosis Date   Anxiety    Arthritis    Diabetes mellitus without complication (HCC)    GERD (gastroesophageal reflux disease)    Giardia    Hyperlipidemia    Hypertension    IBS (irritable bowel syndrome)    Interstitial lung disease (HCC)    Lupus (HCC)    MVP (mitral valve prolapse)    Obesity    Thyroid disease     Past Surgical History:  Procedure Laterality Date   ABDOMINAL HYSTERECTOMY     total   CHOLECYSTECTOMY     CYSTECTOMY Right    hip   REPLACEMENT TOTAL KNEE BILATERAL     REVISION TOTAL KNEE ARTHROPLASTY Right    TONSILLECTOMY AND ADENOIDECTOMY       reports that she has never smoked. She has never used smokeless tobacco. She reports that she does not drink alcohol and does not use drugs.  Allergies  Allergen Reactions   Belimumab Other (See Comments) and Nausea And Vomiting   Mycophenolate Mofetil Other (See Comments) and Nausea And  Vomiting   Phenergan [Promethazine Hcl] Anaphylaxis   Codeine Hives, Itching and Other (See Comments)   Meloxicam Hives   Statins Other (See Comments)    Muscle spasm and cramps   Ezetimibe    Tape     Other reaction(s): Other (See Comments) Very thin skin due to chronic steroid use     Family History  Adopted: Yes  Problem Relation Age of Onset   Cancer Brother        prostates cancer, lung cancer,    Prior to Admission medications   Medication Sig Start Date End Date Taking? Authorizing Provider  acetaminophen (TYLENOL) 500 MG tablet Take 500 mg by mouth every 6 (six) hours as needed for moderate pain or headache.   Yes [provider]  albuterol (VENTOLIN HFA) 108 (90 Base) MCG/ACT inhaler Inhale 1-2 puffs into the lungs every 6 (six) hours as needed for wheezing or shortness of breath.   Yes [provider]  budesonide-formoterol (SYMBICORT) 160-4.5 MCG/ACT inhaler Inhale 2 puffs into the lungs in the morning and at bedtime. 01/27/20 01/30/21 Yes [provider]  Cholecalciferol (VITAMIN D3) 250 MCG (10000 UT) capsule Take 10,000 Units by mouth daily.   Yes [provider]  diazepam (VALIUM) 5 MG tablet TAKE 1 TABLET BY MOUTH DAILY AS NEEDED FOR ANXIETY. Patient taking differently: Take 5 mg  by mouth daily as needed for anxiety. 12/23/19  Yes Baity, Coralie Keens, NP  furosemide (LASIX) 20 MG tablet TAKE 1 TABLET BY MOUTH TWICE A DAY Patient taking differently: Take 20 mg by mouth 2 (two) times daily. 09/01/20  Yes Dutch Quint B, FNP  hydroxychloroquine (PLAQUENIL) 200 MG tablet Take 400 mg by mouth daily. 08/04/19  Yes [provider]  hydrOXYzine (VISTARIL) 25 MG capsule Take 1 capsule (25 mg total) by mouth daily as needed. Patient taking differently: Take 25 mg by mouth daily as needed (sleep). 12/26/20  Yes Michela Pitcher, NP  levothyroxine (SYNTHROID) 50 MCG tablet TAKE 1 TABLET BY MOUTH EVERY DAY BEFORE BREAKFAST Patient taking  differently: Take 50 mcg by mouth daily before breakfast. 11/21/20  Yes Michela Pitcher, NP  metoprolol succinate (TOPROL-XL) 25 MG 24 hr tablet TAKE 1 TABLET (25 MG TOTAL) BY MOUTH DAILY. 07/17/20  Yes Baity, Coralie Keens, NP  Multiple Vitamins-Minerals (WOMENS MULTIVITAMIN PO) Take 1 tablet by mouth daily.   Yes [provider]  NIFEdipine (ADALAT CC) 30 MG 24 hr tablet Take 30 mg by mouth daily.   Yes [provider]  ondansetron (ZOFRAN) 4 MG tablet Take 4 mg by mouth every 8 (eight) hours as needed for nausea or vomiting.   Yes [provider]  OXYGEN Inhale 2.5-3 L into the lungs as needed.   Yes [provider]  pantoprazole (PROTONIX) 40 MG tablet TAKE 1 TABLET BY MOUTH EVERY DAY Patient taking differently: Take 40 mg by mouth daily. 07/05/20  Yes Jearld Fenton, NP  Potassium 99 MG TABS Take 99 mg by mouth daily.   Yes [provider]  predniSONE (DELTASONE) 2.5 MG tablet Take 2.5 mg by mouth daily. 11/09/20  Yes [provider]  riTUXimab (RITUXAN IV) Inject into the vein every 6 (six) months.   Yes [provider]  sertraline (ZOLOFT) 100 MG tablet TAKE 1 TABLET BY MOUTH EVERY DAY Patient taking differently: Take 100 mg by mouth daily. 12/28/20  Yes Dutch Quint B, FNP  sulfamethoxazole-trimethoprim (BACTRIM DS) 800-160 MG tablet Take 1 tablet by mouth every Monday, Wednesday, and Friday.   Yes [provider]    Physical Exam: Constitutional: Moderately built and nourished. Vitals:   01/30/21 0315 01/30/21 0415 01/30/21 0500 01/30/21 0600  BP: 110/61 112/62 104/63 127/67  Pulse: 70 73 71 75  Resp: 17 (!) 32 (!) 27 (!) 31  Temp:      TempSrc:      SpO2: 94% 90% 92% 92%   Eyes: Anicteric no pallor. ENMT: No discharge from the ears eyes nose and mouth. Neck: No mass felt.  No neck rigidity. Respiratory: No rhonchi or crepitations. Cardiovascular: S1-S2 heard. Abdomen: Soft nontender bowel sound  present. Musculoskeletal: No edema. Skin: No rash. Neurologic: Alert awake oriented to time place and person.  Moves all extremities. Psychiatric: Appears normal.  Normal affect.   Labs on Admission: I have personally reviewed following labs and imaging studies  CBC: Recent Labs  Lab 01/29/21 2004  WBC 7.9  HGB 13.3  HCT 42.7  MCV 88.6  PLT 962   Basic Metabolic Panel: Recent Labs  Lab 01/29/21 2004  NA 138  K 3.3*  CL 100  CO2 28  GLUCOSE 105*  BUN 18  CREATININE 1.10*  CALCIUM 8.7*   GFR: CrCl cannot be calculated (Unknown ideal weight.). Liver Function Tests: No results for input(s): AST, ALT, ALKPHOS, BILITOT, PROT, ALBUMIN in the last 168 hours.  No results for input(s): LIPASE, AMYLASE in the last 168 hours. No results for input(s): AMMONIA in the last 168 hours. Coagulation Profile: No results for input(s): INR, PROTIME in the last 168 hours. Cardiac Enzymes: No results for input(s): CKTOTAL, CKMB, CKMBINDEX, TROPONINI in the last 168 hours. BNP (last 3 results) No results for input(s): PROBNP in the last 8760 hours. HbA1C: No results for input(s): HGBA1C in the last 72 hours. CBG: No results for input(s): GLUCAP in the last 168 hours. Lipid Profile: No results for input(s): CHOL, HDL, LDLCALC, TRIG, CHOLHDL, LDLDIRECT in the last 72 hours. Thyroid Function Tests: No results for input(s): TSH, T4TOTAL, FREET4, T3FREE, THYROIDAB in the last 72 hours. Anemia Panel: No results for input(s): VITAMINB12, FOLATE, FERRITIN, TIBC, IRON, RETICCTPCT in the last 72 hours. Urine analysis: No results found for: COLORURINE, APPEARANCEUR, LABSPEC, PHURINE, GLUCOSEU, HGBUR, BILIRUBINUR, KETONESUR, PROTEINUR, UROBILINOGEN, NITRITE, LEUKOCYTESUR Sepsis Labs: @LABRCNTIP (procalcitonin:4,lacticidven:4) ) Recent Results (from the past 240 hour(s))  Resp Panel by RT-PCR (Flu A&B, Covid) Nasopharyngeal Swab     Status: Abnormal   Collection Time: 01/29/21 11:13 PM    Specimen: Nasopharyngeal Swab; Nasopharyngeal(NP) swabs in vial transport medium  Result Value Ref Range Status   SARS Coronavirus 2 by RT PCR POSITIVE (A) NEGATIVE Final    Comment: RESULT CALLED TO, READ BACK BY AND VERIFIED WITH: HARRIS,RN@0053  110/01/22 [place] (NOTE) SARS-CoV-2 target nucleic acids are DETECTED.  The SARS-CoV-2 RNA is generally detectable in upper respiratory specimens during the acute phase of infection. Positive results are indicative of the presence of the identified virus, but do not rule out bacterial infection or co-infection with other pathogens not detected by the test. Clinical correlation with patient history and other diagnostic information is necessary to determine patient infection status. The expected result is Negative.  Fact Sheet for Patients: EntrepreneurPulse.com.au  Fact Sheet for Healthcare Providers: IncredibleEmployment.be  This test is not yet approved or cleared by the [place] FDA and  has been authorized for detection and/or diagnosis of SARS-CoV-2 by FDA under an Emergency Use Authorization (EUA).  This EUA will remain in effect (meaning this test can be used)  for the duration of  the COVID-19 declaration under Section 564(b)(1) of the Act, 21 U.S.C. section 360bbb-3(b)(1), unless the authorization is terminated or revoked sooner.     Influenza A by PCR NEGATIVE NEGATIVE Final   Influenza B by PCR NEGATIVE NEGATIVE Final    Comment: (NOTE) The Xpert Xpress SARS-CoV-2/FLU/RSV plus assay is intended as an aid in the diagnosis of influenza from Nasopharyngeal swab specimens and should not be used as a sole basis for treatment. Nasal washings and aspirates are unacceptable for Xpert Xpress SARS-CoV-2/FLU/RSV testing.  Fact Sheet for Patients: EntrepreneurPulse.com.au  Fact Sheet for Healthcare Providers: IncredibleEmployment.be  This test is not yet  approved or cleared by the [place] FDA and has been authorized for detection and/or diagnosis of SARS-CoV-2 by FDA under an Emergency Use Authorization (EUA). This EUA will remain in effect (meaning this test can be used) for the duration of the COVID-19 declaration under Section 564(b)(1) of the Act, 21 U.S.C. section 360bbb-3(b)(1), unless the authorization is terminated or revoked.  Performed at [facility], [place] [address], [place], [place] [address]      Radiological Exams on Admission: DG Chest 2 View  Result Date: 01/29/2021 CLINICAL DATA:  Shortness of breath.  Lupus. EXAM: CHEST - 2 VIEW COMPARISON:  Chest x-ray 08/10/2005 FINDINGS: The heart and mediastinal contours are within normal  limits. No focal consolidation. Increased interstitial markings and patchy airspace opacities. No pleural effusion. No pneumothorax. No acute osseous abnormality. IMPRESSION: Increased interstitial markings and patchy airspace opacities which may represent infection or inflammation. Followup PA and lateral chest X-ray is recommended in 3-4 weeks following therapy to ensure resolution. Electronically Signed   By: Iven Finn M.D.   On: 01/29/2021 20:47   CT Angio Chest PE W and/or Wo Contrast  Result Date: 01/30/2021 CLINICAL DATA:  Dyspnea, cough, lupus EXAM: CT ANGIOGRAPHY CHEST WITH CONTRAST TECHNIQUE: Multidetector CT imaging of the chest was performed using the standard protocol during bolus administration of intravenous contrast. Multiplanar CT image reconstructions and MIPs were obtained to evaluate the vascular anatomy. CONTRAST:  11mL OMNIPAQUE IOHEXOL 350 MG/ML SOLN COMPARISON:  Chest radiograph 01/29/2021 and 08/10/2005 FINDINGS: Cardiovascular: There is adequate opacification of the pulmonary arterial tree. No intraluminal filling defect identified to suggest acute pulmonary embolism. The central pulmonary arteries are of normal caliber. Cardiac size within normal limits. No  significant coronary artery calcification. No pericardial effusion. Mild atherosclerotic calcification is seen within the thoracic aorta. No aortic aneurysm. Mediastinum/Nodes: No pathologic thoracic adenopathy. The thyroid gland is atrophic. Esophagus unremarkable. Lungs/Pleura: There is extensive, diffuse, asymmetric ground-glass pulmonary infiltrate and pulmonary consolidation. Within these regions, are interspersed areas of relatively preserved lung. Differential considerations in the acute setting include atypical infection, including mycoplasma pneumoniae infection and acute lung injury. If chronic, additional considerations include hypersensitivity pneumonitis, chronic eosinophilic pneumonia or cryptogenic organizing pneumonia. No pneumothorax or pleural effusion. Central airways are widely patent. Upper Abdomen: Status post cholecystectomy.  No acute abnormality. Musculoskeletal: No acute bone abnormality. No lytic or blastic bone lesion. Review of the MIP images confirms the above findings. IMPRESSION: No pulmonary embolism. Widespread pulmonary infiltrates, new since prior chest radiograph of 08/10/2005. Differential considerations as listed above. Electronically Signed   By: Fidela Salisbury M.D.   On: 01/30/2021 03:07    EKG: Independently reviewed.  Normal sinus rhythm.  Assessment/Plan Principal Problem:   Acute respiratory failure due to COVID-19 [facility]) Active Problems:   DM2 (diabetes mellitus, type 2) (HCC)   Hypothyroidism   Hypercholesterolemia   SLE (systemic lupus erythematosus related syndrome) (HCC)   Chronic respiratory failure (HCC)    Acute respiratory failure with hypoxia with COVID infection likely cause for patient's decompensation.  Patient states she has been vaccinated against COVID.  Inflammatory markers are pending at this time.  Patient is requiring 4.5 L oxygen to maintain sats.  Patient is tachypneic.  Patient has been started on Decadron and remdesivir for now.  We  will consult pulmonologist for further recommendations given the history of interstitial lung disease and lupus. History of lupus and interstitial lung disease on Plaquenil and steroids.  Patient is also on chronic Bactrim therapy for prophylaxis. History of GERD on Protonix. Chronic kidney disease stage II.  Creatinine appears to be at baseline. Hypothyroidism on Synthroid.  Since patient has acute respiratory failure with hypoxia will need close monitoring for any further worsening and inpatient status.   DVT prophylaxis: Lovenox. Code Status: Full code. Family Communication: Patient's daughter at the bedside. Disposition Plan: Home. Consults called: Pulmonologist. Admission status: Inpatient.   Rise Patience MD [facility] Pager [phone].  If 7PM-7AM, please contact night-coverage [email or web address] Password [facility]  01/30/2021, 6:56 AM

## 2021-01-30 NOTE — ED Notes (Signed)
Notified nearby RN of pts O2 levels

## 2021-01-30 NOTE — ED Notes (Signed)
Notified nearby RN Shirlee Limerick of pts low O2 levels

## 2021-01-31 DIAGNOSIS — E039 Hypothyroidism, unspecified: Secondary | ICD-10-CM

## 2021-01-31 DIAGNOSIS — J9600 Acute respiratory failure, unspecified whether with hypoxia or hypercapnia: Secondary | ICD-10-CM | POA: Diagnosis not present

## 2021-01-31 DIAGNOSIS — U071 COVID-19: Secondary | ICD-10-CM | POA: Diagnosis not present

## 2021-01-31 LAB — CBC WITH DIFFERENTIAL/PLATELET
Abs Immature Granulocytes: 0.09 10*3/uL — ABNORMAL HIGH (ref 0.00–0.07)
Basophils Absolute: 0 10*3/uL (ref 0.0–0.1)
Basophils Relative: 0 %
Eosinophils Absolute: 0 10*3/uL (ref 0.0–0.5)
Eosinophils Relative: 0 %
HCT: 38.2 % (ref 36.0–46.0)
Hemoglobin: 12.1 g/dL (ref 12.0–15.0)
Immature Granulocytes: 1 %
Lymphocytes Relative: 8 %
Lymphs Abs: 0.7 10*3/uL (ref 0.7–4.0)
MCH: 27.8 pg (ref 26.0–34.0)
MCHC: 31.7 g/dL (ref 30.0–36.0)
MCV: 87.8 fL (ref 80.0–100.0)
Monocytes Absolute: 0.5 10*3/uL (ref 0.1–1.0)
Monocytes Relative: 6 %
Neutro Abs: 8.1 10*3/uL — ABNORMAL HIGH (ref 1.7–7.7)
Neutrophils Relative %: 85 %
Platelets: 270 10*3/uL (ref 150–400)
RBC: 4.35 MIL/uL (ref 3.87–5.11)
RDW: 13.2 % (ref 11.5–15.5)
WBC: 9.5 10*3/uL (ref 4.0–10.5)
nRBC: 0 % (ref 0.0–0.2)

## 2021-01-31 LAB — COMPREHENSIVE METABOLIC PANEL
ALT: 15 U/L (ref 0–44)
AST: 17 U/L (ref 15–41)
Albumin: 2.8 g/dL — ABNORMAL LOW (ref 3.5–5.0)
Alkaline Phosphatase: 82 U/L (ref 38–126)
Anion gap: 11 (ref 5–15)
BUN: 23 mg/dL (ref 8–23)
CO2: 25 mmol/L (ref 22–32)
Calcium: 8.7 mg/dL — ABNORMAL LOW (ref 8.9–10.3)
Chloride: 102 mmol/L (ref 98–111)
Creatinine, Ser: 1.04 mg/dL — ABNORMAL HIGH (ref 0.44–1.00)
GFR, Estimated: 59 mL/min — ABNORMAL LOW (ref >60)
Glucose, Bld: 140 mg/dL — ABNORMAL HIGH (ref 70–99)
Potassium: 3.9 mmol/L (ref 3.5–5.1)
Sodium: 138 mmol/L (ref 135–145)
Total Bilirubin: 0.5 mg/dL (ref 0.3–1.2)
Total Protein: 5.6 g/dL — ABNORMAL LOW (ref 6.5–8.1)

## 2021-01-31 LAB — C-REACTIVE PROTEIN: CRP: 11.4 mg/dL — ABNORMAL HIGH (ref <1.0)

## 2021-01-31 LAB — D-DIMER, QUANTITATIVE: D-Dimer, Quant: 0.99 ug/mL-FEU — ABNORMAL HIGH (ref 0.00–0.50)

## 2021-01-31 MED ORDER — MENTHOL 3 MG MT LOZG
1.0000 | LOZENGE | Freq: Every day | OROMUCOSAL | Status: DC
  Administered 2021-01-31 – 2021-02-05 (×19): 3 mg via ORAL
  Filled 2021-01-31 (×11): qty 9

## 2021-01-31 MED ORDER — METHYLPREDNISOLONE SODIUM SUCC 125 MG IJ SOLR
90.0000 mg | Freq: Two times a day (BID) | INTRAMUSCULAR | Status: DC
  Administered 2021-01-31 – 2021-02-02 (×4): 90 mg via INTRAVENOUS
  Filled 2021-01-31 (×4): qty 2

## 2021-01-31 MED ORDER — ACETAMINOPHEN 500 MG PO TABS
1000.0000 mg | ORAL_TABLET | Freq: Once | ORAL | Status: AC
  Administered 2021-02-01: 1000 mg via ORAL
  Filled 2021-01-31: qty 2

## 2021-01-31 MED ORDER — BARICITINIB 2 MG PO TABS
4.0000 mg | ORAL_TABLET | Freq: Every day | ORAL | Status: DC
  Administered 2021-01-31 – 2021-02-05 (×5): 4 mg via ORAL
  Filled 2021-01-31 (×6): qty 2

## 2021-01-31 NOTE — Plan of Care (Signed)
  Problem: Education: Goal: Knowledge of General Education information will improve Description: Including pain rating scale, medication(s)/side effects and non-pharmacologic comfort measures Outcome: Not Progressing   Problem: Health Behavior/Discharge Planning: Goal: Ability to manage health-related needs will improve Outcome: Not Progressing   Problem: Clinical Measurements: Goal: Ability to maintain clinical measurements within normal limits will improve Outcome: Not Progressing Goal: Will remain free from infection Outcome: Not Progressing Goal: Diagnostic test results will improve Outcome: Not Progressing Goal: Respiratory complications will improve Outcome: Not Progressing Goal: Cardiovascular complication will be avoided Outcome: Not Progressing   Problem: Activity: Goal: Risk for activity intolerance will decrease Outcome: Not Progressing   Problem: Coping: Goal: Level of anxiety will decrease Outcome: Not Progressing   Problem: Elimination: Goal: Will not experience complications related to bowel motility Outcome: Not Progressing Goal: Will not experience complications related to urinary retention Outcome: Not Progressing   Problem: Pain Managment: Goal: General experience of comfort will improve Outcome: Not Progressing   Problem: Safety: Goal: Ability to remain free from injury will improve Outcome: Not Progressing   Problem: Skin Integrity: Goal: Risk for impaired skin integrity will decrease Outcome: Not Progressing   Problem: Education: Goal: Knowledge of risk factors and measures for prevention of condition will improve Outcome: Not Progressing

## 2021-01-31 NOTE — Plan of Care (Signed)
  Problem: Education: Goal: Knowledge of General Education information will improve Description: Including pain rating scale, medication(s)/side effects and non-pharmacologic comfort measures Outcome: Progressing   Problem: Health Behavior/Discharge Planning: Goal: Ability to manage health-related needs will improve Outcome: Progressing   Problem: Clinical Measurements: Goal: Ability to maintain clinical measurements within normal limits will improve Outcome: Progressing   Problem: Respiratory: Goal: Will maintain a patent airway Outcome: Progressing

## 2021-01-31 NOTE — Progress Notes (Addendum)
01/31/2021  Seen in f/u for COVID vs. ILD flare  O2 needs up to 10L  Still having cough paroxysms worse with talking feels like they originate from throat rather than deeper down  Exam remains fairly benign, not tachypneic, severe cough spasms  Still think this is mostly her lupus ILD flaring up but should be okay to give actemra  Will get her IS, throat lozenges.   She does not think she can do proning. Continue steroids, wean O2 as able.  Pct neg.  Sputum and DFA not collected due to not much expectorant.  Suspect low yield anyway.  Will follow with you, hopefully she can pull through this  Erskine Emery MD PCCM     NAME:  Dana Doyle, MRN:  [id number], DOB:  29-Jan-1951, LOS: 1 ADMISSION DATE:  01/29/2021, CONSULTATION DATE:  01/30/21 REFERRING MD:  TRH, CHIEF COMPLAINT:  SOB   History of Present Illness:  70 year old woman with history of SLE-associated ILD (dx 2017) on 2L HOT p/w 1 week of worsening cough, dyspnea.  Cough productive of white sputum.  Also lost taste/smell.  Loss of appetite.  Denies fevers.  Vaccinated.  COVID + in ER.   Vaccinated.  O2 needs up, CTA chest showing no PE, extensive bilateral GGO, PCCM consulted to help manage.  Baseline lung function a bit down on reviewing prior records.   Her SLE therapy is plaquenil, prednisone, and q29mo rituxan.  Due for another dose of rituxan next month.  Taking bactrim for PJP ppx.  Pertinent  Medical History  SLE on DMARDs as detailed above Anxiety GERD Hypothyroid  Significant Hospital Events: Including procedures, antibiotic start and stop dates in addition to other pertinent events   11/1 admit and consult  Interim History / Subjective:  Currently on nonrebreather with O2 sats 96%  Objective   Blood pressure 106/61, pulse 60, temperature 97.6 F (36.4 C), temperature source Oral, resp. rate 16, height 5' 2.5" (1.588 m), weight 92.5 kg, SpO2 92 %.       No intake or output data in the 24 hours ending  01/31/21 0842 Filed Weights   01/30/21 0600  Weight: 92.5 kg    Examination: General: Having extensive coughing at this time Suppressed HENT: No JVD or lymphadenopathy is appreciated Lungs: Bibasilar crackles Cardiovascular: Rhythm regular Abdomen:  soft positive bowel sound Extremities: No edema Neuro: moves all 4 ext to command, able to transfer on own Skin: no rashes, normal tone  Resolved Hospital Problem list   N/a  Assessment & Plan:  Acute on chronic hypoxemic respiratory failure in context of (A) significant O2 dependent ILD (B) COVID positivity.  Overall symptoms are most c/w her prior flares and her rituxan is due soon which could explain current issues.   She has been on bactrim so I think risk of PJP is low; she is making sputum so we can check regardless.  Continue oxygen to maintain O2 sats greater than 90%.  He is currently on a nonrebreather with O2 saturations of 96%  Continue bronchodilators  Continue Plaquenil, remdesivir , Solu-Medrol and Bactrim  Agree with diuresis currently on 20 mg of Lasix twice daily but no intake and output is listed  Follow culture data  Note increasing cough we will add cough suppressant  Pulmonary will continue to follow along     Best Practice (right click and "Reselect all SmartList Selections" daily)   Per primary  Labs   CBC: Recent Labs  Lab 01/29/21 2004 01/30/21  1109 01/31/21 0244  WBC 7.9 4.7 9.5  NEUTROABS  --   --  8.1*  HGB 13.3 12.1 12.1  HCT 42.7 37.3 38.2  MCV 88.6 88.6 87.8  PLT 317 236 003    Basic Metabolic Panel: Recent Labs  Lab 01/29/21 2004 01/30/21 1109 01/31/21 0244  NA 138  --  138  K 3.3*  --  3.9  CL 100  --  102  CO2 28  --  25  GLUCOSE 105*  --  140*  BUN 18  --  23  CREATININE 1.10* 1.02* 1.04*  CALCIUM 8.7*  --  8.7*   GFR: Estimated Creatinine Clearance: 55.4 mL/min (A) (by C-G formula based on SCr of 1.04 mg/dL (H)). Recent Labs  Lab 01/29/21 2004 01/30/21 1109  01/31/21 0244  PROCALCITON  --  <0.10  --   WBC 7.9 4.7 9.5    Liver Function Tests: Recent Labs  Lab 01/31/21 0244  AST 17  ALT 15  ALKPHOS 82  BILITOT 0.5  PROT 5.6*  ALBUMIN 2.8*   No results for input(s): LIPASE, AMYLASE in the last 168 hours. No results for input(s): AMMONIA in the last 168 hours.  ABG No results found for: PHART, PCO2ART, PO2ART, HCO3, TCO2, ACIDBASEDEF, O2SAT   Coagulation Profile: No results for input(s): INR, PROTIME in the last 168 hours.  Cardiac Enzymes: No results for input(s): CKTOTAL, CKMB, CKMBINDEX, TROPONINI in the last 168 hours.  HbA1C: Hgb A1c MFr Bld  Date/Time Value Ref Range Status  11/20/2020 12:45 PM 5.5 4.6 - 6.5 % Final    Comment:    Glycemic Control Guidelines for People with Diabetes:Non Diabetic:  <6%Goal of Therapy: <7%Additional Action Suggested:  >8%   04/25/2020 11:32 AM 5.3 4.6 - 6.5 % Final    Comment:    Glycemic Control Guidelines for People with Diabetes:Non Diabetic:  <6%Goal of Therapy: <7%Additional Action Suggested:  >8%     CBG: No results for input(s): GLUCAP in the last 168 hours.   Richardson Landry Minor ACNP Acute Care Nurse Practitioner [place] Please consult Amion 01/31/2021, 8:43 AM

## 2021-01-31 NOTE — Progress Notes (Signed)
TR[facility] night cross cover note:  I received page from RN requesting optimization of antitussive intervention.  Patient here with COVID-19 infection, with reported cough refractory to as needed Tessalon Perles as well as as needed Robitussin DM.  I considered trying Tussionex, however the patient has reported allergy to codeine with she has previously experienced hives.  I ultimately consulted respiratory therapy for potential humidification of existing supplemental oxygen as well as addition of flutter valve.  Additionally, patient c/o back pain with her cough. Will refrain from NSAIDs in setting of COVID as well as opioids given aforementioned reported allergy. Per chart review, pt has not received any acetaminophen over the last 24 hours.  I placed order for acetaminophen 1000 mg p.o. x1 dose now.      Babs Bertin, DO Hospitalist

## 2021-01-31 NOTE — Progress Notes (Signed)
PT Cancellation Note  Patient Details Name: Dana Doyle MRN: [id number] DOB: [birth year]   Cancelled Treatment:    Reason Eval/Treat Not Completed: Patient declined, no reason specified;Other (comment) (pt  declines PT/therapies at this time.).  Signing off per pt's wishes. 01/31/2021  Ginger Carne., PT Acute Rehabilitation Services [phone]  (pager) [phone]  (office)   Tessie Fass Hatley Henegar 01/31/2021, 5:58 PM

## 2021-01-31 NOTE — Progress Notes (Signed)
PROGRESS NOTE        PATIENT DETAILS Name: Dana Doyle Age: 70 y.o. Sex: female Date of Birth: 1950-12-12 Admit Date: 01/29/2021 Admitting Physician Rise Patience, MD [id number], Alyson Locket, NP  Brief Narrative: Patient is a 70 y.o. female with history of SLE, ILD associated with SLE, chronic hypoxic respiratory failure on 2 L of oxygen at home-presenting with 2-week history of fatigue/shortness of breath and cough.  She was found to have acute on chronic hypoxic respiratory failure due to COVID-19 pneumonia.  Her husband was also recently diagnosed with COVID-19 pneumonia.  Subjective: Short of breath with minimal activity-up to 10 L of HFNC this morning.  Appears relatively comfortable at rest.  Objective: Vitals: Blood pressure (!) 106/55, pulse 67, temperature 97.7 F (36.5 C), temperature source Oral, resp. rate 20, height 5' 2.5" (1.588 m), weight 92.5 kg, SpO2 91 %.   Exam: Gen Exam:Alert awake-not in any distress HEENT:atraumatic, normocephalic Chest: Bibasilar rales. CVS:S1S2 regular Abdomen:soft non tender, non distended Extremities:no edema Neurology: Non focal Skin: no rash  Pertinent Labs/Radiology: Recent Labs  Lab 01/31/21 0244  WBC 9.5  HGB 12.1  PLT 270  NA 138  K 3.9  CREATININE 1.04*  AST 17  ALT 15  ALKPHOS 82  BILITOT 0.5    Recent Labs    01/30/21 0926 01/30/21 1109 01/31/21 0244  DDIMER 3.04*  --  0.99*  CRP  --  13.2* 11.4*    11/1>> CT angiogram of the chest: No PE-widespread pulmonary infiltrates.  Assessment/Plan: Acute on chronic hypoxic respiratory failure due to COVID-19 pneumonia: Unfortunately has developed worsening hypoxemia overnight-now on 10 L of HFNC-we will change steroids to 90 mg IV twice daily for a few days-continue Remdesivir.  Extensive discussion regarding risk/benefits of Actemra/baricitinib discussed with patient-and then with patient's daughter over the phone-no history of  TB-aware and accepting all risks including opportunistic infection/bowel perforation etc-hence we will try baricitinib and see how she does.  Have encouraged incentive spirometry use/mobilization with nursing staff-and prone positioning if patient can tolerate.  We will follow closely-see goals of care discussion below.  History of lupus-ILD (associated with lupus) with chronic hypoxic respiratory failure on 2 L of oxygen at home: On steroids-Plaquenil and remains on Bactrim for P GCP prophylaxis  Gets Rituxan infusion every 6 months (due for another infusion late November).    Hypothyroidism: Continue Synthroid  HTN: BP stable-continue to hold all antihypertensives for now.  Depression/anxiety: More anxious than usual given acute illness-continue diazepam as needed-remains on Zoloft.    Goals of care: DNR reconfirmed with patient-and subsequently with her daughter Dana Doyle over the phone on 11/2.  Patient very clear that she wants to continue treatment-however if she was to continue worsening-and exhibit signs of suffering-she does not want to prolong the situation at that point-and would want to transition to hospice/comfort measures.  Obesity: Estimated body mass index is 36.72 kg/m as calculated from the following:   Height as of this encounter: 5' 2.5" (1.588 m).   Weight as of this encounter: 92.5 kg.    Procedures: None Consults: PCCM DVT Prophylaxis: Lovenox Code Status: DNR Family Communication: Daughter [id number] over the phone on 11/2.  Time spent: 35 minutes-Greater than 50% of this time was spent in counseling, explanation of diagnosis, planning of further management, and coordination of care.   Disposition Plan: Status  is: Inpatient  Remains inpatient appropriate because: Severe hypoxia related to COVID-19 infection-on IV steroids/immunomodulators/Remdesivir.  Not yet stable for discharge.    Diet: Diet Order             Diet regular Room service  appropriate? Yes; Fluid consistency: Thin  Diet effective now                     Antimicrobial agents: Anti-infectives (From admission, onward)    Start     Dose/Rate Route Frequency Ordered Stop   01/31/21 1000  sulfamethoxazole-trimethoprim (BACTRIM DS) 800-160 MG per tablet 1 tablet        1 tablet Oral Every M-W-F 01/30/21 0655     01/31/21 1000  remdesivir 100 mg in sodium chloride 0.9 % 100 mL IVPB       See Hyperspace for full Linked Orders Report.   100 mg 200 mL/hr over 30 Minutes Intravenous Daily 01/30/21 0655 02/04/21 0959   01/30/21 1000  hydroxychloroquine (PLAQUENIL) tablet 400 mg        400 mg Oral Daily 01/30/21 0655     01/30/21 0800  remdesivir 200 mg in sodium chloride 0.9% 250 mL IVPB       See Hyperspace for full Linked Orders Report.   200 mg 580 mL/hr over 30 Minutes Intravenous Once 01/30/21 0655 01/30/21 0940        MEDICATIONS: Scheduled Meds:  baricitinib  4 mg Oral Daily   enoxaparin (LOVENOX) injection  40 mg Subcutaneous Q24H   furosemide  20 mg Oral BID   hydroxychloroquine  400 mg Oral Daily   levothyroxine  50 mcg Oral Q0600   methylPREDNISolone (SOLU-MEDROL) injection  90 mg Intravenous Q12H   mometasone-formoterol  2 puff Inhalation BID   pantoprazole  40 mg Oral Daily   sertraline  100 mg Oral Daily   sulfamethoxazole-trimethoprim  1 tablet Oral Q M,W,F   Continuous Infusions:  remdesivir 100 mg in NS 100 mL 100 mg (01/31/21 0858)   PRN Meds:.acetaminophen **OR** acetaminophen, albuterol, diazepam, guaiFENesin-dextromethorphan, hydrOXYzine, ondansetron (ZOFRAN) IV   I have personally reviewed following labs and imaging studies  LABORATORY DATA: CBC: Recent Labs  Lab 01/29/21 2004 01/30/21 1109 01/31/21 0244  WBC 7.9 4.7 9.5  NEUTROABS  --   --  8.1*  HGB 13.3 12.1 12.1  HCT 42.7 37.3 38.2  MCV 88.6 88.6 87.8  PLT 317 236 762    Basic Metabolic Panel: Recent Labs  Lab 01/29/21 2004 01/30/21 1109 01/31/21 0244   NA 138  --  138  K 3.3*  --  3.9  CL 100  --  102  CO2 28  --  25  GLUCOSE 105*  --  140*  BUN 18  --  23  CREATININE 1.10* 1.02* 1.04*  CALCIUM 8.7*  --  8.7*    GFR: Estimated Creatinine Clearance: 55.4 mL/min (A) (by C-G formula based on SCr of 1.04 mg/dL (H)).  Liver Function Tests: Recent Labs  Lab 01/31/21 0244  AST 17  ALT 15  ALKPHOS 82  BILITOT 0.5  PROT 5.6*  ALBUMIN 2.8*   No results for input(s): LIPASE, AMYLASE in the last 168 hours. No results for input(s): AMMONIA in the last 168 hours.  Coagulation Profile: No results for input(s): INR, PROTIME in the last 168 hours.  Cardiac Enzymes: No results for input(s): CKTOTAL, CKMB, CKMBINDEX, TROPONINI in the last 168 hours.  BNP (last 3 results) No results for input(s): PROBNP in the  last 8760 hours.  Lipid Profile: No results for input(s): CHOL, HDL, LDLCALC, TRIG, CHOLHDL, LDLDIRECT in the last 72 hours.  Thyroid Function Tests: No results for input(s): TSH, T4TOTAL, FREET4, T3FREE, THYROIDAB in the last 72 hours.  Anemia Panel: No results for input(s): VITAMINB12, FOLATE, FERRITIN, TIBC, IRON, RETICCTPCT in the last 72 hours.  Urine analysis: No results found for: COLORURINE, APPEARANCEUR, LABSPEC, PHURINE, GLUCOSEU, HGBUR, BILIRUBINUR, KETONESUR, PROTEINUR, UROBILINOGEN, NITRITE, LEUKOCYTESUR  Sepsis Labs: Lactic Acid, Venous No results found for: LATICACIDVEN  MICROBIOLOGY: Recent Results (from the past 240 hour(s))  Resp Panel by RT-PCR (Flu A&B, Covid) Nasopharyngeal Swab     Status: Abnormal   Collection Time: 01/29/21 11:13 PM   Specimen: Nasopharyngeal Swab; Nasopharyngeal(NP) swabs in vial transport medium  Result Value Ref Range Status   SARS Coronavirus 2 by RT PCR POSITIVE (A) NEGATIVE Final    Comment: RESULT CALLED TO, READ BACK BY AND VERIFIED WITH: HARRIS,RN@0053  110/01/22 [place] (NOTE) SARS-CoV-2 target nucleic acids are DETECTED.  The SARS-CoV-2 RNA is generally detectable in  upper respiratory specimens during the acute phase of infection. Positive results are indicative of the presence of the identified virus, but do not rule out bacterial infection or co-infection with other pathogens not detected by the test. Clinical correlation with patient history and other diagnostic information is necessary to determine patient infection status. The expected result is Negative.  Fact Sheet for Patients: EntrepreneurPulse.com.au  Fact Sheet for Healthcare Providers: IncredibleEmployment.be  This test is not yet approved or cleared by the [place] FDA and  has been authorized for detection and/or diagnosis of SARS-CoV-2 by FDA under an Emergency Use Authorization (EUA).  This EUA will remain in effect (meaning this test can be used)  for the duration of  the COVID-19 declaration under Section 564(b)(1) of the Act, 21 U.S.C. section 360bbb-3(b)(1), unless the authorization is terminated or revoked sooner.     Influenza A by PCR NEGATIVE NEGATIVE Final   Influenza B by PCR NEGATIVE NEGATIVE Final    Comment: (NOTE) The Xpert Xpress SARS-CoV-2/FLU/RSV plus assay is intended as an aid in the diagnosis of influenza from Nasopharyngeal swab specimens and should not be used as a sole basis for treatment. Nasal washings and aspirates are unacceptable for Xpert Xpress SARS-CoV-2/FLU/RSV testing.  Fact Sheet for Patients: EntrepreneurPulse.com.au  Fact Sheet for Healthcare Providers: IncredibleEmployment.be  This test is not yet approved or cleared by the [place] FDA and has been authorized for detection and/or diagnosis of SARS-CoV-2 by FDA under an Emergency Use Authorization (EUA). This EUA will remain in effect (meaning this test can be used) for the duration of the COVID-19 declaration under Section 564(b)(1) of the Act, 21 U.S.C. section 360bbb-3(b)(1), unless the authorization is  terminated or revoked.  Performed at [facility], [place] [address], [place], [place] [address]     RADIOLOGY STUDIES/RESULTS: DG Chest 2 View  Result Date: 01/29/2021 CLINICAL DATA:  Shortness of breath.  Lupus. EXAM: CHEST - 2 VIEW COMPARISON:  Chest x-ray 08/10/2005 FINDINGS: The heart and mediastinal contours are within normal limits. No focal consolidation. Increased interstitial markings and patchy airspace opacities. No pleural effusion. No pneumothorax. No acute osseous abnormality. IMPRESSION: Increased interstitial markings and patchy airspace opacities which may represent infection or inflammation. Followup PA and lateral chest X-ray is recommended in 3-4 weeks following therapy to ensure resolution. Electronically Signed   By: Iven Finn M.D.   On: 01/29/2021 20:47   CT Angio Chest PE W and/or  Wo Contrast  Result Date: 01/30/2021 CLINICAL DATA:  Dyspnea, cough, lupus EXAM: CT ANGIOGRAPHY CHEST WITH CONTRAST TECHNIQUE: Multidetector CT imaging of the chest was performed using the standard protocol during bolus administration of intravenous contrast. Multiplanar CT image reconstructions and MIPs were obtained to evaluate the vascular anatomy. CONTRAST:  38mL OMNIPAQUE IOHEXOL 350 MG/ML SOLN COMPARISON:  Chest radiograph 01/29/2021 and 08/10/2005 FINDINGS: Cardiovascular: There is adequate opacification of the pulmonary arterial tree. No intraluminal filling defect identified to suggest acute pulmonary embolism. The central pulmonary arteries are of normal caliber. Cardiac size within normal limits. No significant coronary artery calcification. No pericardial effusion. Mild atherosclerotic calcification is seen within the thoracic aorta. No aortic aneurysm. Mediastinum/Nodes: No pathologic thoracic adenopathy. The thyroid gland is atrophic. Esophagus unremarkable. Lungs/Pleura: There is extensive, diffuse, asymmetric ground-glass pulmonary infiltrate and pulmonary consolidation.  Within these regions, are interspersed areas of relatively preserved lung. Differential considerations in the acute setting include atypical infection, including mycoplasma pneumoniae infection and acute lung injury. If chronic, additional considerations include hypersensitivity pneumonitis, chronic eosinophilic pneumonia or cryptogenic organizing pneumonia. No pneumothorax or pleural effusion. Central airways are widely patent. Upper Abdomen: Status post cholecystectomy.  No acute abnormality. Musculoskeletal: No acute bone abnormality. No lytic or blastic bone lesion. Review of the MIP images confirms the above findings. IMPRESSION: No pulmonary embolism. Widespread pulmonary infiltrates, new since prior chest radiograph of 08/10/2005. Differential considerations as listed above. Electronically Signed   By: Fidela Salisbury M.D.   On: 01/30/2021 03:07     LOS: 1 day   Oren Binet, MD  [facility]    To contact the attending provider between 7A-7P or the covering provider during after hours 7P-7A, please log into the web site [email or web address] and access using universal [place] password for that web site. If you do not have the password, please call the hospital operator.  01/31/2021, 10:50 AM

## 2021-02-01 ENCOUNTER — Inpatient Hospital Stay (HOSPITAL_COMMUNITY): Payer: Medicare HMO

## 2021-02-01 DIAGNOSIS — U071 COVID-19: Secondary | ICD-10-CM | POA: Diagnosis not present

## 2021-02-01 DIAGNOSIS — J9600 Acute respiratory failure, unspecified whether with hypoxia or hypercapnia: Secondary | ICD-10-CM | POA: Diagnosis not present

## 2021-02-01 LAB — COMPREHENSIVE METABOLIC PANEL
ALT: 13 U/L (ref 0–44)
AST: 13 U/L — ABNORMAL LOW (ref 15–41)
Albumin: 2.6 g/dL — ABNORMAL LOW (ref 3.5–5.0)
Alkaline Phosphatase: 74 U/L (ref 38–126)
Anion gap: 8 (ref 5–15)
BUN: 26 mg/dL — ABNORMAL HIGH (ref 8–23)
CO2: 28 mmol/L (ref 22–32)
Calcium: 8.4 mg/dL — ABNORMAL LOW (ref 8.9–10.3)
Chloride: 102 mmol/L (ref 98–111)
Creatinine, Ser: 1.28 mg/dL — ABNORMAL HIGH (ref 0.44–1.00)
GFR, Estimated: 46 mL/min — ABNORMAL LOW (ref >60)
Glucose, Bld: 152 mg/dL — ABNORMAL HIGH (ref 70–99)
Potassium: 3.9 mmol/L (ref 3.5–5.1)
Sodium: 138 mmol/L (ref 135–145)
Total Bilirubin: 0.2 mg/dL — ABNORMAL LOW (ref 0.3–1.2)
Total Protein: 5.2 g/dL — ABNORMAL LOW (ref 6.5–8.1)

## 2021-02-01 LAB — CBC WITH DIFFERENTIAL/PLATELET
Abs Immature Granulocytes: 0.07 10*3/uL (ref 0.00–0.07)
Basophils Absolute: 0 10*3/uL (ref 0.0–0.1)
Basophils Relative: 0 %
Eosinophils Absolute: 0 10*3/uL (ref 0.0–0.5)
Eosinophils Relative: 0 %
HCT: 35.5 % — ABNORMAL LOW (ref 36.0–46.0)
Hemoglobin: 11.6 g/dL — ABNORMAL LOW (ref 12.0–15.0)
Immature Granulocytes: 1 %
Lymphocytes Relative: 9 %
Lymphs Abs: 0.9 10*3/uL (ref 0.7–4.0)
MCH: 28.2 pg (ref 26.0–34.0)
MCHC: 32.7 g/dL (ref 30.0–36.0)
MCV: 86.2 fL (ref 80.0–100.0)
Monocytes Absolute: 0.3 10*3/uL (ref 0.1–1.0)
Monocytes Relative: 3 %
Neutro Abs: 8.1 10*3/uL — ABNORMAL HIGH (ref 1.7–7.7)
Neutrophils Relative %: 87 %
Platelets: 289 10*3/uL (ref 150–400)
RBC: 4.12 MIL/uL (ref 3.87–5.11)
RDW: 13.2 % (ref 11.5–15.5)
WBC: 9.3 10*3/uL (ref 4.0–10.5)
nRBC: 0 % (ref 0.0–0.2)

## 2021-02-01 LAB — C-REACTIVE PROTEIN: CRP: 9.3 mg/dL — ABNORMAL HIGH (ref <1.0)

## 2021-02-01 LAB — D-DIMER, QUANTITATIVE: D-Dimer, Quant: 1.36 ug/mL-FEU — ABNORMAL HIGH (ref 0.00–0.50)

## 2021-02-01 MED ORDER — ALBUTEROL SULFATE HFA 108 (90 BASE) MCG/ACT IN AERS
2.0000 | INHALATION_SPRAY | Freq: Four times a day (QID) | RESPIRATORY_TRACT | Status: DC
  Administered 2021-02-01 – 2021-02-05 (×14): 2 via RESPIRATORY_TRACT
  Filled 2021-02-01: qty 6.7

## 2021-02-01 MED ORDER — ALBUTEROL SULFATE HFA 108 (90 BASE) MCG/ACT IN AERS
2.0000 | INHALATION_SPRAY | RESPIRATORY_TRACT | Status: DC | PRN
  Administered 2021-02-04: 2 via RESPIRATORY_TRACT
  Filled 2021-02-01: qty 6.7

## 2021-02-01 MED ORDER — MELATONIN 5 MG PO TABS
5.0000 mg | ORAL_TABLET | Freq: Every day | ORAL | Status: DC
  Administered 2021-02-01 – 2021-02-03 (×3): 5 mg via ORAL
  Filled 2021-02-01 (×4): qty 1

## 2021-02-01 MED ORDER — ALPRAZOLAM 0.25 MG PO TABS
0.2500 mg | ORAL_TABLET | Freq: Three times a day (TID) | ORAL | Status: DC | PRN
  Administered 2021-02-01 – 2021-02-04 (×4): 0.25 mg via ORAL
  Filled 2021-02-01 (×4): qty 1

## 2021-02-01 MED ORDER — BENZONATATE 100 MG PO CAPS
200.0000 mg | ORAL_CAPSULE | Freq: Three times a day (TID) | ORAL | Status: DC
  Administered 2021-02-01 – 2021-02-05 (×12): 200 mg via ORAL
  Filled 2021-02-01 (×13): qty 2

## 2021-02-01 MED ORDER — MELATONIN 3 MG PO TABS: 3.0000 mg | ORAL_TABLET | Freq: Every day | ORAL | Status: DC

## 2021-02-01 NOTE — Progress Notes (Signed)
PROGRESS NOTE        PATIENT DETAILS Name: Dana Doyle Age: 70 y.o. Sex: female Date of Birth: 29-Aug-1950 Admit Date: 01/29/2021 Admitting Physician Rise Patience, MD [id number], Alyson Locket, NP  Brief Narrative: Patient is a 69 y.o. female with history of SLE, ILD associated with SLE, chronic hypoxic respiratory failure on 2 L of oxygen at home-presenting with 2-week history of fatigue/shortness of breath and cough.  She was found to have acute on chronic hypoxic respiratory failure due to COVID-19 pneumonia.  Her husband was also recently diagnosed with COVID-19 pneumonia.  Subjective: Feels better-Down to 3-4 L of oxygen (was on almost 10 L of HFNC yesterday)  Objective: Vitals: Blood pressure (!) 101/53, pulse 62, temperature 98.5 F (36.9 C), temperature source Oral, resp. rate (!) 25, height 5' 2.5" (1.588 m), weight 92.5 kg, SpO2 93 %.   Exam: Gen Exam:Alert awake-not in any distress HEENT:atraumatic, normocephalic Chest: Few bibasilar rales CVS:S1S2 regular Abdomen:soft non tender, non distended Extremities:no edema Neurology: Non focal Skin: no rash   Pertinent Labs/Radiology: Recent Labs  Lab 01/31/21 0244 02/01/21 0355  WBC 9.5 9.3  HGB 12.1 11.6*  PLT 270 289  NA 138 138  K 3.9 3.9  CREATININE 1.04* 1.28*  AST 17 13*  ALT 15 13  ALKPHOS 82 74  BILITOT 0.5 0.2*     Recent Labs    01/30/21 0926 01/30/21 1109 01/31/21 0244 02/01/21 0355  DDIMER 3.04*  --  0.99* 1.36*  CRP  --  13.2* 11.4* 9.3*     11/1>> CT angiogram of the chest: No PE-widespread pulmonary infiltrates.  Assessment/Plan: Acute on chronic hypoxic respiratory failure due to COVID-19 pneumonia versus lupus associated ILD flare: Hypoxia has improved overnight following increasing steroid dosage and adding baricitinib.  Remains on Remdesivir.  No signs of volume overload-do not think patient needs diuretics today.  Continue to encourage  mobilization/incentive spirometry.  Attempt to continue to titrate down FiO2 (down to 3-4 L while I was in the room)  History of lupus-ILD (associated with lupus) with chronic hypoxic respiratory failure on 2 L of oxygen at home: On steroids-Plaquenil and remains on Bactrim for PJP prophylaxis  Gets Rituxan infusion every 6 months (due for another infusion late November).    AKI: Mild-likely hemodynamically mediated-watch closely-hold Lasix  Hypothyroidism: Continue Synthroid  HTN: BP stable-continue to hold all antihypertensives for now.  Depression/anxiety: Continues to have some anxiety-we will switch to Xanax per patient's request-remains on Zoloft.  Goals of care: DNR reconfirmed with patient-and subsequently with her daughter Jacqlyn Larsen over the phone on 11/2.  Patient very clear that she wants to continue treatment-however if she was to continue worsening-and exhibit signs of suffering-she does not want to prolong the situation at that point-and would want to transition to hospice/comfort measures.  Obesity: Estimated body mass index is 36.72 kg/m as calculated from the following:   Height as of this encounter: 5' 2.5" (1.588 m).   Weight as of this encounter: 92.5 kg.    Procedures: None Consults: PCCM DVT Prophylaxis: Lovenox Code Status: DNR Family Communication: Daughter [id number] at bedside on 11/3.  Time spent: 35 minutes-Greater than 50% of this time was spent in counseling, explanation of diagnosis, planning of further management, and coordination of care.   Disposition Plan: Status is: Inpatient  Remains inpatient appropriate because: Severe hypoxia related  to COVID-19 infection-on IV steroids/immunomodulators/Remdesivir.  Not yet stable for discharge.    Diet: Diet Order             Diet regular Room service appropriate? Yes; Fluid consistency: Thin  Diet effective now                     Antimicrobial agents: Anti-infectives (From  admission, onward)    Start     Dose/Rate Route Frequency Ordered Stop   01/31/21 1000  sulfamethoxazole-trimethoprim (BACTRIM DS) 800-160 MG per tablet 1 tablet        1 tablet Oral Every M-W-F 01/30/21 0655     01/31/21 1000  remdesivir 100 mg in sodium chloride 0.9 % 100 mL IVPB       See Hyperspace for full Linked Orders Report.   100 mg 200 mL/hr over 30 Minutes Intravenous Daily 01/30/21 0655 02/04/21 0959   01/30/21 1000  hydroxychloroquine (PLAQUENIL) tablet 400 mg        400 mg Oral Daily 01/30/21 0655     01/30/21 0800  remdesivir 200 mg in sodium chloride 0.9% 250 mL IVPB       See Hyperspace for full Linked Orders Report.   200 mg 580 mL/hr over 30 Minutes Intravenous Once 01/30/21 0655 01/30/21 0940        MEDICATIONS: Scheduled Meds:  baricitinib  4 mg Oral Daily   enoxaparin (LOVENOX) injection  40 mg Subcutaneous Q24H   hydroxychloroquine  400 mg Oral Daily   levothyroxine  50 mcg Oral Q0600   melatonin  3 mg Oral QHS   menthol-cetylpyridinium  1 lozenge Oral 5 X Daily   methylPREDNISolone (SOLU-MEDROL) injection  90 mg Intravenous Q12H   mometasone-formoterol  2 puff Inhalation BID   pantoprazole  40 mg Oral Daily   sertraline  100 mg Oral Daily   sulfamethoxazole-trimethoprim  1 tablet Oral Q M,W,F   Continuous Infusions:  remdesivir 100 mg in NS 100 mL 100 mg (02/01/21 0819)   PRN Meds:.acetaminophen **OR** acetaminophen, albuterol, diazepam, guaiFENesin-dextromethorphan, hydrOXYzine, ondansetron (ZOFRAN) IV   I have personally reviewed following labs and imaging studies  LABORATORY DATA: CBC: Recent Labs  Lab 01/29/21 2004 01/30/21 1109 01/31/21 0244 02/01/21 0355  WBC 7.9 4.7 9.5 9.3  NEUTROABS  --   --  8.1* 8.1*  HGB 13.3 12.1 12.1 11.6*  HCT 42.7 37.3 38.2 35.5*  MCV 88.6 88.6 87.8 86.2  PLT 317 236 270 289     Basic Metabolic Panel: Recent Labs  Lab 01/29/21 2004 01/30/21 1109 01/31/21 0244 02/01/21 0355  NA 138  --  138 138   K 3.3*  --  3.9 3.9  CL 100  --  102 102  CO2 28  --  25 28  GLUCOSE 105*  --  140* 152*  BUN 18  --  23 26*  CREATININE 1.10* 1.02* 1.04* 1.28*  CALCIUM 8.7*  --  8.7* 8.4*     GFR: Estimated Creatinine Clearance: 45 mL/min (A) (by C-G formula based on SCr of 1.28 mg/dL (H)).  Liver Function Tests: Recent Labs  Lab 01/31/21 0244 02/01/21 0355  AST 17 13*  ALT 15 13  ALKPHOS 82 74  BILITOT 0.5 0.2*  PROT 5.6* 5.2*  ALBUMIN 2.8* 2.6*    No results for input(s): LIPASE, AMYLASE in the last 168 hours. No results for input(s): AMMONIA in the last 168 hours.  Coagulation Profile: No results for input(s): INR, PROTIME in the last 168  hours.  Cardiac Enzymes: No results for input(s): CKTOTAL, CKMB, CKMBINDEX, TROPONINI in the last 168 hours.  BNP (last 3 results) No results for input(s): PROBNP in the last 8760 hours.  Lipid Profile: No results for input(s): CHOL, HDL, LDLCALC, TRIG, CHOLHDL, LDLDIRECT in the last 72 hours.  Thyroid Function Tests: No results for input(s): TSH, T4TOTAL, FREET4, T3FREE, THYROIDAB in the last 72 hours.  Anemia Panel: No results for input(s): VITAMINB12, FOLATE, FERRITIN, TIBC, IRON, RETICCTPCT in the last 72 hours.  Urine analysis: No results found for: COLORURINE, APPEARANCEUR, LABSPEC, PHURINE, GLUCOSEU, HGBUR, BILIRUBINUR, KETONESUR, PROTEINUR, UROBILINOGEN, NITRITE, LEUKOCYTESUR  Sepsis Labs: Lactic Acid, Venous No results found for: LATICACIDVEN  MICROBIOLOGY: Recent Results (from the past 240 hour(s))  Resp Panel by RT-PCR (Flu A&B, Covid) Nasopharyngeal Swab     Status: Abnormal   Collection Time: 01/29/21 11:13 PM   Specimen: Nasopharyngeal Swab; Nasopharyngeal(NP) swabs in vial transport medium  Result Value Ref Range Status   SARS Coronavirus 2 by RT PCR POSITIVE (A) NEGATIVE Final    Comment: RESULT CALLED TO, READ BACK BY AND VERIFIED WITH: HARRIS,RN@0053  110/01/22 [place] (NOTE) SARS-CoV-2 target nucleic acids are  DETECTED.  The SARS-CoV-2 RNA is generally detectable in upper respiratory specimens during the acute phase of infection. Positive results are indicative of the presence of the identified virus, but do not rule out bacterial infection or co-infection with other pathogens not detected by the test. Clinical correlation with patient history and other diagnostic information is necessary to determine patient infection status. The expected result is Negative.  Fact Sheet for Patients: EntrepreneurPulse.com.au  Fact Sheet for Healthcare Providers: IncredibleEmployment.be  This test is not yet approved or cleared by the [place] FDA and  has been authorized for detection and/or diagnosis of SARS-CoV-2 by FDA under an Emergency Use Authorization (EUA).  This EUA will remain in effect (meaning this test can be used)  for the duration of  the COVID-19 declaration under Section 564(b)(1) of the Act, 21 U.S.C. section 360bbb-3(b)(1), unless the authorization is terminated or revoked sooner.     Influenza A by PCR NEGATIVE NEGATIVE Final   Influenza B by PCR NEGATIVE NEGATIVE Final    Comment: (NOTE) The Xpert Xpress SARS-CoV-2/FLU/RSV plus assay is intended as an aid in the diagnosis of influenza from Nasopharyngeal swab specimens and should not be used as a sole basis for treatment. Nasal washings and aspirates are unacceptable for Xpert Xpress SARS-CoV-2/FLU/RSV testing.  Fact Sheet for Patients: EntrepreneurPulse.com.au  Fact Sheet for Healthcare Providers: IncredibleEmployment.be  This test is not yet approved or cleared by the [place] FDA and has been authorized for detection and/or diagnosis of SARS-CoV-2 by FDA under an Emergency Use Authorization (EUA). This EUA will remain in effect (meaning this test can be used) for the duration of the COVID-19 declaration under Section 564(b)(1) of the Act, 21  U.S.C. section 360bbb-3(b)(1), unless the authorization is terminated or revoked.  Performed at [facility], [place] [address] [place], [place] [address]     RADIOLOGY STUDIES/RESULTS: DG CHEST PORT 1 VIEW  Result Date: 02/01/2021 CLINICAL DATA:  Shortness of breath, COVID, lupus EXAM: PORTABLE CHEST 1 VIEW COMPARISON:  Multiple priors FINDINGS: Hypoventilatory exam. The cardiomediastinal silhouette is unremarkable given projection and technique. No appreciable pleural effusion. No pneumothorax. Bilateral interstitial and patchy pulmonary opacities appear slightly improved when compared to October 31st radiograph. No acute osseous abnormality. IMPRESSION: Bilateral interstitial and patchy pulmonary opacities appear slightly improved. Electronically Signed   By:  Albin Felling M.D.   On: 02/01/2021 08:35     LOS: 2 days   Oren Binet, MD  [facility]    To contact the attending provider between 7A-7P or the covering provider during after hours 7P-7A, please log into the web site [email or web address] and access using universal [place] password for that web site. If you do not have the password, please call the hospital operator.  02/01/2021, 1:17 PM

## 2021-02-01 NOTE — Progress Notes (Signed)
02/01/2021  I have seen and evaluated the patient for COVID +/- ILD flare  S:  Slept okay Doing better from O2 standpoint Desats with coughing fits but thinks she is recovering faster  O: Blood pressure (!) 101/53, pulse 62, temperature 98.5 F (36.9 C), temperature source Oral, resp. rate (!) 25, height 5' 2.5" (1.588 m), weight 92.5 kg, SpO2 93 %.  Unchanged, crackles on exam, heart sounds regular, ext warm No edema, moves all 4 ext to command, good insight  X ray actually looks pretty good  A:  AHRF due to AE-ILD +/- COVID- improving  P:  Will get her IS, needs to use this and work on mobility Remdesivir is fine, actemra given 11/2 x 1 Continue steroids, supportive care, bactrim for PJP ppx Use higher O2 at night as she gets noctural awakening likely from sleep disordered breathing, she is too claustrophobic for PAP, will try a sleep aid  Erskine Emery MD [facility] [place] epic messenger for cross cover needs If after hours, please call E-link

## 2021-02-01 NOTE — Evaluation (Signed)
Occupational Therapy Evaluation Patient Details Name: Dana Doyle MRN: [id number] DOB: 06/04/1950 Today's Date: 02/01/2021   History of Present Illness HPI: Dana Doyle is a 70 y.o. female with history of lupus and interstitial lung disease, hypothyroidism presents to the ER because of worsening shortness of breath.  Constant cough ongoing for the last 2 weeks.   Clinical Impression   Patient admitted for the diagnosis above.  PTA she was on 3L of O2 at home, lives with her spouse, and has a daughter that lives close by.  At home she did use a RW at times, but was generally independent with ADL/IADL completion.  Currently she is complaining of a nagging cough, O2 sat dropping with mobility, and low back pain, but despite this, she is very close to baseline.  The patient is needing increased time and therapeutic rest breaks.  She plan on returning home, she declines acute rehab, and should be able to transition home without [facility] services.        Recommendations for follow up therapy are one component of a multi-disciplinary discharge planning process, led by the attending physician.  Recommendations may be updated based on patient status, additional functional criteria and insurance authorization.   Follow Up Recommendations  No OT follow up    Assistance Recommended at Discharge PRN  Functional Status Assessment  Patient has had a recent decline in their functional status and demonstrates the ability to make significant improvements in function in a reasonable and predictable amount of time.  Equipment Recommendations  None recommended by OT    Recommendations for Other Services       Precautions / Restrictions Precautions Precautions: Fall Precaution Comments: `Watch O2 Restrictions Weight Bearing Restrictions: No      Mobility Bed Mobility Overal bed mobility: Modified Independent               Patient Response: Cooperative  Transfers Overall transfer level:  Modified independent                 General transfer comment: requested longer O2 cord      Balance Overall balance assessment: Mild deficits observed, not formally tested                                         ADL either performed or assessed with clinical judgement   ADL Overall ADL's : At baseline                                       General ADL Comments: patient able to complete ADL and in room mobility with increased effort and breaks for O2 recovery     Vision Baseline Vision/History: 1 Wears glasses Patient Visual Report: No change from baseline       Perception Perception Perception: Not tested   Praxis Praxis Praxis: Not tested    Pertinent Vitals/Pain Pain Assessment: Faces Faces Pain Scale: Hurts little more Pain Location: Low back Pain Descriptors / Indicators: Nagging Pain Intervention(s): Monitored during session     Hand Dominance Right   Extremity/Trunk Assessment Upper Extremity Assessment Upper Extremity Assessment: Overall WFL for tasks assessed   Lower Extremity Assessment Lower Extremity Assessment: Generalized weakness   Cervical / Trunk Assessment Cervical / Trunk Assessment: Normal   Communication Communication Communication: No difficulties  Cognition Arousal/Alertness: Awake/alert Behavior During Therapy: WFL for tasks assessed/performed Overall Cognitive Status: Within Functional Limits for tasks assessed                                       General Comments   VSS on HFNC    Exercises     Shoulder Instructions      Home Living Family/patient expects to be discharged to:: Private residence Living Arrangements: Spouse/significant other Available Help at Discharge: Family;Available 24 hours/day Type of Home: House       Home Layout: One level     Bathroom Shower/Tub: Teacher, early years/pre: Standard Bathroom Accessibility: Yes How Accessible:  Accessible via walker Home Equipment: [place] (2 wheels);Cane - single point          Prior Functioning/Environment Prior Level of Function : Independent/Modified Independent             Mobility Comments: 3 L of O2 at bseline, walks with occasional use of RW ADLs Comments: No assist with ADL/IADL        OT Problem List: Decreased activity tolerance;Impaired balance (sitting and/or standing)      OT Treatment/Interventions:      OT Goals(Current goals can be found in the care plan section) Acute Rehab OT Goals Patient Stated Goal: Return home and recover OT Goal Formulation: With patient Time For Goal Achievement: 02/01/21 Potential to Achieve Goals: Good  OT Frequency:     Barriers to D/C:  None noted          Co-evaluation              AM-PAC OT "6 Clicks" Daily Activity     Outcome Measure Help from another person eating meals?: None Help from another person taking care of personal grooming?: None Help from another person toileting, which includes using toliet, bedpan, or urinal?: None Help from another person bathing (including washing, rinsing, drying)?: None Help from another person to put on and taking off regular upper body clothing?: None Help from another person to put on and taking off regular lower body clothing?: None 6 Click Score: 24   End of Session Equipment Utilized During Treatment: Oxygen Nurse Communication: Mobility status  Activity Tolerance: Patient tolerated treatment well Patient left: in bed;with call bell/phone within reach;with family/visitor present  OT Visit Diagnosis: Unsteadiness on feet (R26.81)                Time: 4970-2637 OT Time Calculation (min): 22 min Charges:  OT General Charges $OT Visit: 1 Visit OT Evaluation $OT Eval Moderate Complexity: 1 Mod  02/01/2021  RP, OTR/L  Acute Rehabilitation Services  Office:  [phone]   Metta Clines 02/01/2021, 9:30 AM

## 2021-02-02 DIAGNOSIS — J9610 Chronic respiratory failure, unspecified whether with hypoxia or hypercapnia: Secondary | ICD-10-CM | POA: Diagnosis not present

## 2021-02-02 DIAGNOSIS — J9600 Acute respiratory failure, unspecified whether with hypoxia or hypercapnia: Secondary | ICD-10-CM | POA: Diagnosis not present

## 2021-02-02 DIAGNOSIS — U071 COVID-19: Secondary | ICD-10-CM | POA: Diagnosis not present

## 2021-02-02 LAB — CBC WITH DIFFERENTIAL/PLATELET
Abs Immature Granulocytes: 0.1 K/uL — ABNORMAL HIGH (ref 0.00–0.07)
Basophils Absolute: 0 K/uL (ref 0.0–0.1)
Basophils Relative: 0 %
Eosinophils Absolute: 0 K/uL (ref 0.0–0.5)
Eosinophils Relative: 0 %
HCT: 33.4 % — ABNORMAL LOW (ref 36.0–46.0)
Hemoglobin: 11.2 g/dL — ABNORMAL LOW (ref 12.0–15.0)
Immature Granulocytes: 2 %
Lymphocytes Relative: 10 %
Lymphs Abs: 0.6 K/uL — ABNORMAL LOW (ref 0.7–4.0)
MCH: 28.6 pg (ref 26.0–34.0)
MCHC: 33.5 g/dL (ref 30.0–36.0)
MCV: 85.4 fL (ref 80.0–100.0)
Monocytes Absolute: 0.3 K/uL (ref 0.1–1.0)
Monocytes Relative: 5 %
Neutro Abs: 4.8 K/uL (ref 1.7–7.7)
Neutrophils Relative %: 83 %
Platelets: 261 K/uL (ref 150–400)
RBC: 3.91 MIL/uL (ref 3.87–5.11)
RDW: 13.2 % (ref 11.5–15.5)
WBC: 5.7 K/uL (ref 4.0–10.5)
nRBC: 0 % (ref 0.0–0.2)

## 2021-02-02 LAB — COMPREHENSIVE METABOLIC PANEL
ALT: 10 U/L (ref 0–44)
AST: 12 U/L — ABNORMAL LOW (ref 15–41)
Albumin: 2.7 g/dL — ABNORMAL LOW (ref 3.5–5.0)
Alkaline Phosphatase: 73 U/L (ref 38–126)
Anion gap: 7 (ref 5–15)
BUN: 25 mg/dL — ABNORMAL HIGH (ref 8–23)
CO2: 26 mmol/L (ref 22–32)
Calcium: 8.4 mg/dL — ABNORMAL LOW (ref 8.9–10.3)
Chloride: 102 mmol/L (ref 98–111)
Creatinine, Ser: 1.09 mg/dL — ABNORMAL HIGH (ref 0.44–1.00)
GFR, Estimated: 55 mL/min — ABNORMAL LOW (ref >60)
Glucose, Bld: 197 mg/dL — ABNORMAL HIGH (ref 70–99)
Potassium: 3 mmol/L — ABNORMAL LOW (ref 3.5–5.1)
Sodium: 135 mmol/L (ref 135–145)
Total Bilirubin: 0.3 mg/dL (ref 0.3–1.2)
Total Protein: 5 g/dL — ABNORMAL LOW (ref 6.5–8.1)

## 2021-02-02 LAB — D-DIMER, QUANTITATIVE: D-Dimer, Quant: 0.48 ug/mL-FEU (ref 0.00–0.50)

## 2021-02-02 LAB — C-REACTIVE PROTEIN: CRP: 4.1 mg/dL — ABNORMAL HIGH (ref <1.0)

## 2021-02-02 MED ORDER — METHYLPREDNISOLONE SODIUM SUCC 125 MG IJ SOLR
90.0000 mg | INTRAMUSCULAR | Status: DC
  Administered 2021-02-03: 90 mg via INTRAVENOUS
  Filled 2021-02-02: qty 2

## 2021-02-02 NOTE — Plan of Care (Signed)

## 2021-02-02 NOTE — Progress Notes (Signed)
PROGRESS NOTE        PATIENT DETAILS Name: Dana Doyle Age: 70 y.o. Sex: female Date of Birth: 1950/08/26 Admit Date: 01/29/2021 Admitting Physician Rise Patience, MD STM:[id number], Alyson Locket, NP  Brief Narrative: Patient is a 70 y.o. female with history of SLE, ILD associated with SLE, chronic hypoxic respiratory failure on 2 L of oxygen at home-presenting with 2-week history of fatigue/shortness of breath and cough.  She was found to have acute on chronic hypoxic respiratory failure due to COVID-19 pneumonia.  Her husband was also recently diagnosed with COVID-19 pneumonia.  Patient was subsequently admitted to the hospitalist service-post hospitalization-hypoxia worsened-she required up to 10 L of HFNC.  She was started on baricitinib-Solu-Medrol dosage was escalated to 2 mg/kg-with these measures-she rapidly improved-and was able to be titrated down to just 2 L of oxygen as of 11/4.  Plans are to continue to taper down steroids-continue baricitinib-and continue to monitor for the next few days before consideration of discharge  Subjective: Clinically improved-Down to 2 L of oxygen.  Feels better as well.  Continues to have coughing spells.  Objective: Vitals: Blood pressure (!) 112/53, pulse (!) 57, temperature 97.6 F (36.4 C), temperature source Oral, resp. rate 18, height 5' 2.5" (1.588 m), weight 92.5 kg, SpO2 95 %.   Exam: Gen Exam:Alert awake-not in any distress HEENT:atraumatic, normocephalic Chest: B/L clear to auscultation anteriorly CVS:S1S2 regular Abdomen:soft non tender, non distended Extremities:no edema Neurology: Non focal Skin: no rash   Pertinent Labs/Radiology: Recent Labs  Lab 01/31/21 0244 02/01/21 0355 02/02/21 0319  WBC 9.5 9.3 5.7  HGB 12.1 11.6* 11.2*  PLT 270 289 261  NA 138 138 135  K 3.9 3.9 3.0*  CREATININE 1.04* 1.28* 1.09*  AST 17 13* 12*  ALT 15 13 10   ALKPHOS 82 74 73  BILITOT 0.5 0.2* 0.3      Recent Labs    01/31/21 0244 02/01/21 0355 02/02/21 0319  DDIMER 0.99* 1.36* 0.48  CRP 11.4* 9.3* 4.1*     11/1>> CT angiogram of the chest: No PE-widespread pulmonary infiltrates.  Assessment/Plan: Acute on chronic hypoxic respiratory failure due to COVID-19 pneumonia versus lupus associated ILD flare: Hypoxia has improved-down to 2 L/min-decrease steroids to 90 mg daily (1 mg/kg).  Remains on baricitinib.  No signs of volume overload-do not think patient requires diuretics.  Continue mobilization/incentive spirometry.  Continue close monitoring- will require a slow taper of steroids back to her usual home regimen of 2.5 mg of prednisone daily.  She will need to follow-up with a outpatient pulmonologist at [facility].  History of lupus-ILD (associated with lupus) with chronic hypoxic respiratory failure on 2 L of oxygen at home: Maintained on prednisone 2.5 mg daily, Plaquenil and Bactrim for PJP prophylaxis.   Gets Rituxan infusion every 6 months (due for another infusion late November).    AKI: Mild-likely hemodynamically mediated-diuretics held-AKI improved-follow.  Hypothyroidism: Continue Synthroid  HTN: BP stable-continue to hold all antihypertensives for now.  Depression/anxiety: Continues to have some anxiety-we will switch to Xanax per patient's request-remains on Zoloft.  Goals of care: DNR reconfirmed with patient-and subsequently with her daughter Jacqlyn Larsen over the phone on 11/2.  Patient very clear that she wants to continue treatment-however if she was to continue worsening-and exhibit signs of suffering-she does not want to prolong the situation at that point-and would want  to transition to hospice/comfort measures.  Obesity: Estimated body mass index is 36.72 kg/m as calculated from the following:   Height as of this encounter: 5' 2.5" (1.588 m).   Weight as of this encounter: 92.5 kg.    Procedures: None Consults: PCCM DVT Prophylaxis: Lovenox Code Status:  DNR Family Communication:  Spoke with son over the phone-on 11/4. Daughter [phone]-updated at bedside on 11/3.  Time spent: 25 minutes-Greater than 50% of this time was spent in counseling, explanation of diagnosis, planning of further management, and coordination of care.   Disposition Plan: Status is: Inpatient  Remains inpatient appropriate because: Severe hypoxia related to COVID-19 infection-on IV steroids/immunomodulators/Remdesivir.  Not yet stable for discharge.    Diet: Diet Order             Diet regular Room service appropriate? Yes; Fluid consistency: Thin  Diet effective now                     Antimicrobial agents: Anti-infectives (From admission, onward)    Start     Dose/Rate Route Frequency Ordered Stop   01/31/21 1000  sulfamethoxazole-trimethoprim (BACTRIM DS) 800-160 MG per tablet 1 tablet        1 tablet Oral Every M-W-F 01/30/21 0655     01/31/21 1000  remdesivir 100 mg in sodium chloride 0.9 % 100 mL IVPB       See Hyperspace for full Linked Orders Report.   100 mg 200 mL/hr over 30 Minutes Intravenous Daily 01/30/21 0655 02/04/21 0959   01/30/21 1000  hydroxychloroquine (PLAQUENIL) tablet 400 mg        400 mg Oral Daily 01/30/21 0655     01/30/21 0800  remdesivir 200 mg in sodium chloride 0.9% 250 mL IVPB       See Hyperspace for full Linked Orders Report.   200 mg 580 mL/hr over 30 Minutes Intravenous Once 01/30/21 0655 01/30/21 0940        MEDICATIONS: Scheduled Meds:  albuterol  2 puff Inhalation Q6H   baricitinib  4 mg Oral Daily   benzonatate  200 mg Oral TID   enoxaparin (LOVENOX) injection  40 mg Subcutaneous Q24H   hydroxychloroquine  400 mg Oral Daily   levothyroxine  50 mcg Oral Q0600   melatonin  5 mg Oral QHS   menthol-cetylpyridinium  1 lozenge Oral 5 X Daily   [START ON 02/03/2021] methylPREDNISolone (SOLU-MEDROL) injection  90 mg Intravenous Q24H   mometasone-formoterol  2 puff Inhalation BID   pantoprazole   40 mg Oral Daily   sertraline  100 mg Oral Daily   sulfamethoxazole-trimethoprim  1 tablet Oral Q M,W,F   Continuous Infusions:  remdesivir 100 mg in NS 100 mL 100 mg (02/02/21 0943)   PRN Meds:.acetaminophen **OR** acetaminophen, albuterol, ALPRAZolam, guaiFENesin-dextromethorphan, hydrOXYzine, ondansetron (ZOFRAN) IV   I have personally reviewed following labs and imaging studies  LABORATORY DATA: CBC: Recent Labs  Lab 01/29/21 2004 01/30/21 1109 01/31/21 0244 02/01/21 0355 02/02/21 0319  WBC 7.9 4.7 9.5 9.3 5.7  NEUTROABS  --   --  8.1* 8.1* 4.8  HGB 13.3 12.1 12.1 11.6* 11.2*  HCT 42.7 37.3 38.2 35.5* 33.4*  MCV 88.6 88.6 87.8 86.2 85.4  PLT 317 236 270 289 261     Basic Metabolic Panel: Recent Labs  Lab 01/29/21 2004 01/30/21 1109 01/31/21 0244 02/01/21 0355 02/02/21 0319  NA 138  --  138 138 135  K 3.3*  --  3.9 3.9 3.0*  CL 100  --  102 102 102  CO2 28  --  25 28 26   GLUCOSE 105*  --  140* 152* 197*  BUN 18  --  23 26* 25*  CREATININE 1.10* 1.02* 1.04* 1.28* 1.09*  CALCIUM 8.7*  --  8.7* 8.4* 8.4*     GFR: Estimated Creatinine Clearance: 52.9 mL/min (A) (by C-G formula based on SCr of 1.09 mg/dL (H)).  Liver Function Tests: Recent Labs  Lab 01/31/21 0244 02/01/21 0355 02/02/21 0319  AST 17 13* 12*  ALT 15 13 10   ALKPHOS 82 74 73  BILITOT 0.5 0.2* 0.3  PROT 5.6* 5.2* 5.0*  ALBUMIN 2.8* 2.6* 2.7*    No results for input(s): LIPASE, AMYLASE in the last 168 hours. No results for input(s): AMMONIA in the last 168 hours.  Coagulation Profile: No results for input(s): INR, PROTIME in the last 168 hours.  Cardiac Enzymes: No results for input(s): CKTOTAL, CKMB, CKMBINDEX, TROPONINI in the last 168 hours.  BNP (last 3 results) No results for input(s): PROBNP in the last 8760 hours.  Lipid Profile: No results for input(s): CHOL, HDL, LDLCALC, TRIG, CHOLHDL, LDLDIRECT in the last 72 hours.  Thyroid Function Tests: No results for input(s):  TSH, T4TOTAL, FREET4, T3FREE, THYROIDAB in the last 72 hours.  Anemia Panel: No results for input(s): VITAMINB12, FOLATE, FERRITIN, TIBC, IRON, RETICCTPCT in the last 72 hours.  Urine analysis: No results found for: COLORURINE, APPEARANCEUR, LABSPEC, PHURINE, GLUCOSEU, HGBUR, BILIRUBINUR, KETONESUR, PROTEINUR, UROBILINOGEN, NITRITE, LEUKOCYTESUR  Sepsis Labs: Lactic Acid, Venous No results found for: LATICACIDVEN  MICROBIOLOGY: Recent Results (from the past 240 hour(s))  Resp Panel by RT-PCR (Flu A&B, Covid) Nasopharyngeal Swab     Status: Abnormal   Collection Time: 01/29/21 11:13 PM   Specimen: Nasopharyngeal Swab; Nasopharyngeal(NP) swabs in vial transport medium  Result Value Ref Range Status   SARS Coronavirus 2 by RT PCR POSITIVE (A) NEGATIVE Final    Comment: RESULT CALLED TO, READ BACK BY AND VERIFIED WITH: HARRIS,RN@0053  110/01/22 [place] (NOTE) SARS-CoV-2 target nucleic acids are DETECTED.  The SARS-CoV-2 RNA is generally detectable in upper respiratory specimens during the acute phase of infection. Positive results are indicative of the presence of the identified virus, but do not rule out bacterial infection or co-infection with other pathogens not detected by the test. Clinical correlation with patient history and other diagnostic information is necessary to determine patient infection status. The expected result is Negative.  Fact Sheet for Patients: EntrepreneurPulse.com.au  Fact Sheet for Healthcare Providers: IncredibleEmployment.be  This test is not yet approved or cleared by the [place] FDA and  has been authorized for detection and/or diagnosis of SARS-CoV-2 by FDA under an Emergency Use Authorization (EUA).  This EUA will remain in effect (meaning this test can be used)  for the duration of  the COVID-19 declaration under Section 564(b)(1) of the Act, 21 U.S.C. section 360bbb-3(b)(1), unless the authorization  is terminated or revoked sooner.     Influenza A by PCR NEGATIVE NEGATIVE Final   Influenza B by PCR NEGATIVE NEGATIVE Final    Comment: (NOTE) The Xpert Xpress SARS-CoV-2/FLU/RSV plus assay is intended as an aid in the diagnosis of influenza from Nasopharyngeal swab specimens and should not be used as a sole basis for treatment. Nasal washings and aspirates are unacceptable for Xpert Xpress SARS-CoV-2/FLU/RSV testing.  Fact Sheet for Patients: EntrepreneurPulse.com.au  Fact Sheet for Healthcare Providers: IncredibleEmployment.be  This test is not yet approved or cleared by the [place]  FDA and has been authorized for detection and/or diagnosis of SARS-CoV-2 by FDA under an Emergency Use Authorization (EUA). This EUA will remain in effect (meaning this test can be used) for the duration of the COVID-19 declaration under Section 564(b)(1) of the Act, 21 U.S.C. section 360bbb-3(b)(1), unless the authorization is terminated or revoked.  Performed at [facility], [place] [address], [place], [place] [address]     RADIOLOGY STUDIES/RESULTS: DG CHEST PORT 1 VIEW  Result Date: 02/01/2021 CLINICAL DATA:  Shortness of breath, COVID, lupus EXAM: PORTABLE CHEST 1 VIEW COMPARISON:  Multiple priors FINDINGS: Hypoventilatory exam. The cardiomediastinal silhouette is unremarkable given projection and technique. No appreciable pleural effusion. No pneumothorax. Bilateral interstitial and patchy pulmonary opacities appear slightly improved when compared to October 31st radiograph. No acute osseous abnormality. IMPRESSION: Bilateral interstitial and patchy pulmonary opacities appear slightly improved. Electronically Signed   By: Albin Felling M.D.   On: 02/01/2021 08:35     LOS: 3 days   Oren Binet, MD  [facility]    To contact the attending provider between 7A-7P or the covering provider during after hours 7P-7A, please log into the  web site [email or web address] and access using universal [place] password for that web site. If you do not have the password, please call the hospital operator.  02/02/2021, 4:10 PM

## 2021-02-02 NOTE — Progress Notes (Signed)
NAME:  Dana Doyle, MRN:  [id number], DOB:  06/19/1950, LOS: 3 ADMISSION DATE:  01/29/2021, CONSULTATION DATE:  01/30/21 REFERRING MD:  TRH, CHIEF COMPLAINT:  SOB   History of Present Illness:  70 year old woman with history of SLE-associated ILD (dx 2017) on 2L HOT p/w 1 week of worsening cough, dyspnea.  Cough productive of white sputum.  Also lost taste/smell.  Loss of appetite.  Denies fevers.  Vaccinated.  COVID + in ER.   Vaccinated.  O2 needs up, CTA chest showing no PE, extensive bilateral GGO, PCCM consulted to help manage.  Baseline lung function a bit down on reviewing prior records.   Her SLE therapy is plaquenil, prednisone, and q59mo rituxan.  Due for another dose of rituxan next month.  Taking bactrim for PJP ppx.  Pertinent  Medical History  SLE on DMARDs as detailed above Anxiety GERD Hypothyroid  Significant Hospital Events: Including procedures, antibiotic start and stop dates in addition to other pertinent events   11/1 admit and consult  Interim History / Subjective:  Currently on 4 L nasal cannula with O2 sats 95  Objective   Blood pressure (!) 117/59, pulse (!) 57, temperature 97.7 F (36.5 C), temperature source Oral, resp. rate 17, height 5' 2.5" (1.588 m), weight 92.5 kg, SpO2 95 %.        Intake/Output Summary (Last 24 hours) at 02/02/2021 0924 Last data filed at 02/02/2021 0600 Gross per 24 hour  Intake 180 ml  Output --  Net 180 ml   Filed Weights   01/30/21 0600  Weight: 92.5 kg    Examination: General: 70 year old female who appears older than her stated age 59: No JVD or lymphadenopathy is appreciated Neuro: Grossly intact without focal defect demeanor is much improved today she is much more upbeat CV: Heart sounds are regular PULM: Diminished throughout with some faint expiratory wheeze 4 L nasal cannula GI: soft, bsx4 active  GU: Voids Extremities: warm/dry, 1+ edema, noted to have multiple areas of ecchymosis on  arms Skin: no rashes or lesions   Resolved Hospital Problem list   N/a  Assessment & Plan:  Acute on chronic hypoxemic respiratory failure in context of (A) significant O2 dependent ILD (B) COVID positivity.  Overall symptoms are most c/w her prior flares and her rituxan is due soon which could explain current issues.   She has been on bactrim so I think risk of PJP is low; she is making sputum so we can check regardless.   Coughing paroxysms  Wean O2 as tolerated note she is approaching her baseline of 2 L nasal cannula currently on 4 L nasal cannula. Continue bronchodilators and pulmonary toilet which she says helps Continue Plaquenil remdesivir Solu-Medrol and Bactrim Diuresis as tolerated Monitor culture data Cough suppressants, note cough is improved remarkably. Wean steroids currently on 90 mg every 12 hours last modified on 01/31/2021, home dose of prednisone is 2.5 mg daily              Best Practice (right click and "Reselect all SmartList Selections" daily)   Per primary  Labs   CBC: Recent Labs  Lab 01/29/21 2004 01/30/21 1109 01/31/21 0244 02/01/21 0355 02/02/21 0319  WBC 7.9 4.7 9.5 9.3 5.7  NEUTROABS  --   --  8.1* 8.1* 4.8  HGB 13.3 12.1 12.1 11.6* 11.2*  HCT 42.7 37.3 38.2 35.5* 33.4*  MCV 88.6 88.6 87.8 86.2 85.4  PLT 317 236 270 289 261  Basic Metabolic Panel: Recent Labs  Lab 01/29/21 2004 01/30/21 1109 01/31/21 0244 02/01/21 0355 02/02/21 0319  NA 138  --  138 138 135  K 3.3*  --  3.9 3.9 3.0*  CL 100  --  102 102 102  CO2 28  --  25 28 26   GLUCOSE 105*  --  140* 152* 197*  BUN 18  --  23 26* 25*  CREATININE 1.10* 1.02* 1.04* 1.28* 1.09*  CALCIUM 8.7*  --  8.7* 8.4* 8.4*   GFR: Estimated Creatinine Clearance: 52.9 mL/min (A) (by C-G formula based on SCr of 1.09 mg/dL (H)). Recent Labs  Lab 01/30/21 1109 01/31/21 0244 02/01/21 0355 02/02/21 0319  PROCALCITON <0.10  --   --   --   WBC 4.7 9.5 9.3 5.7    Liver Function  Tests: Recent Labs  Lab 01/31/21 0244 02/01/21 0355 02/02/21 0319  AST 17 13* 12*  ALT 15 13 10   ALKPHOS 82 74 73  BILITOT 0.5 0.2* 0.3  PROT 5.6* 5.2* 5.0*  ALBUMIN 2.8* 2.6* 2.7*   No results for input(s): LIPASE, AMYLASE in the last 168 hours. No results for input(s): AMMONIA in the last 168 hours.  ABG No results found for: PHART, PCO2ART, PO2ART, HCO3, TCO2, ACIDBASEDEF, O2SAT   Coagulation Profile: No results for input(s): INR, PROTIME in the last 168 hours.  Cardiac Enzymes: No results for input(s): CKTOTAL, CKMB, CKMBINDEX, TROPONINI in the last 168 hours.  HbA1C: Hgb A1c MFr Bld  Date/Time Value Ref Range Status  11/20/2020 12:45 PM 5.5 4.6 - 6.5 % Final    Comment:    Glycemic Control Guidelines for People with Diabetes:Non Diabetic:  <6%Goal of Therapy: <7%Additional Action Suggested:  >8%   04/25/2020 11:32 AM 5.3 4.6 - 6.5 % Final    Comment:    Glycemic Control Guidelines for People with Diabetes:Non Diabetic:  <6%Goal of Therapy: <7%Additional Action Suggested:  >8%     CBG: No results for input(s): GLUCAP in the last 168 hours.   Richardson Landry Tyshea Imel ACNP Acute Care Nurse Practitioner [place] Please consult Amion 02/02/2021, 9:24 AM

## 2021-02-03 ENCOUNTER — Inpatient Hospital Stay (HOSPITAL_COMMUNITY): Payer: Medicare HMO

## 2021-02-03 LAB — COMPREHENSIVE METABOLIC PANEL
ALT: 11 U/L (ref 0–44)
AST: 18 U/L (ref 15–41)
Albumin: 2.9 g/dL — ABNORMAL LOW (ref 3.5–5.0)
Alkaline Phosphatase: 69 U/L (ref 38–126)
Anion gap: 9 (ref 5–15)
BUN: 24 mg/dL — ABNORMAL HIGH (ref 8–23)
CO2: 28 mmol/L (ref 22–32)
Calcium: 8.5 mg/dL — ABNORMAL LOW (ref 8.9–10.3)
Chloride: 101 mmol/L (ref 98–111)
Creatinine, Ser: 1.38 mg/dL — ABNORMAL HIGH (ref 0.44–1.00)
GFR, Estimated: 42 mL/min — ABNORMAL LOW (ref >60)
Glucose, Bld: 103 mg/dL — ABNORMAL HIGH (ref 70–99)
Potassium: 3.2 mmol/L — ABNORMAL LOW (ref 3.5–5.1)
Sodium: 138 mmol/L (ref 135–145)
Total Bilirubin: 0.6 mg/dL (ref 0.3–1.2)
Total Protein: 5.3 g/dL — ABNORMAL LOW (ref 6.5–8.1)

## 2021-02-03 LAB — CBC WITH DIFFERENTIAL/PLATELET
Abs Immature Granulocytes: 0.16 10*3/uL — ABNORMAL HIGH (ref 0.00–0.07)
Basophils Absolute: 0 10*3/uL (ref 0.0–0.1)
Basophils Relative: 0 %
Eosinophils Absolute: 0 10*3/uL (ref 0.0–0.5)
Eosinophils Relative: 0 %
HCT: 35.6 % — ABNORMAL LOW (ref 36.0–46.0)
Hemoglobin: 11.5 g/dL — ABNORMAL LOW (ref 12.0–15.0)
Immature Granulocytes: 2 %
Lymphocytes Relative: 14 %
Lymphs Abs: 1 10*3/uL (ref 0.7–4.0)
MCH: 28 pg (ref 26.0–34.0)
MCHC: 32.3 g/dL (ref 30.0–36.0)
MCV: 86.8 fL (ref 80.0–100.0)
Monocytes Absolute: 0.6 10*3/uL (ref 0.1–1.0)
Monocytes Relative: 8 %
Neutro Abs: 5.4 10*3/uL (ref 1.7–7.7)
Neutrophils Relative %: 76 %
Platelets: 280 10*3/uL (ref 150–400)
RBC: 4.1 MIL/uL (ref 3.87–5.11)
RDW: 13.4 % (ref 11.5–15.5)
WBC: 7.2 10*3/uL (ref 4.0–10.5)
nRBC: 0 % (ref 0.0–0.2)

## 2021-02-03 LAB — D-DIMER, QUANTITATIVE: D-Dimer, Quant: 1 ug/mL-FEU — ABNORMAL HIGH (ref 0.00–0.50)

## 2021-02-03 LAB — C-REACTIVE PROTEIN: CRP: 1.2 mg/dL — ABNORMAL HIGH (ref <1.0)

## 2021-02-03 LAB — MAGNESIUM: Magnesium: 2.2 mg/dL (ref 1.7–2.4)

## 2021-02-03 MED ORDER — LACTATED RINGERS IV BOLUS
500.0000 mL | Freq: Once | INTRAVENOUS | Status: AC
  Administered 2021-02-03: 500 mL via INTRAVENOUS

## 2021-02-03 MED ORDER — METHYLPREDNISOLONE SODIUM SUCC 125 MG IJ SOLR
60.0000 mg | Freq: Two times a day (BID) | INTRAMUSCULAR | Status: DC
  Administered 2021-02-03 – 2021-02-04 (×2): 60 mg via INTRAVENOUS
  Filled 2021-02-03 (×2): qty 2

## 2021-02-03 MED ORDER — POTASSIUM CHLORIDE CRYS ER 20 MEQ PO TBCR
40.0000 meq | EXTENDED_RELEASE_TABLET | Freq: Once | ORAL | Status: AC
  Administered 2021-02-03: 40 meq via ORAL
  Filled 2021-02-03: qty 2

## 2021-02-03 MED ORDER — POTASSIUM CHLORIDE CRYS ER 20 MEQ PO TBCR
20.0000 meq | EXTENDED_RELEASE_TABLET | Freq: Once | ORAL | Status: AC
  Administered 2021-02-03: 20 meq via ORAL
  Filled 2021-02-03: qty 1

## 2021-02-03 NOTE — Progress Notes (Signed)
PROGRESS NOTE        PATIENT DETAILS Name: Dana Doyle Age: 71 y.o. Sex: female Date of Birth: [birth year] Admit Date: 01/29/2021 Admitting Physician Rise Patience, MD FBP:ZWCHE, Alyson Locket, NP  Brief Narrative: Patient is a 70 y.o. female with history of SLE, ILD associated with SLE, chronic hypoxic respiratory failure on 2 L of oxygen at home-presenting with 2-week history of fatigue/shortness of breath and cough.  She was found to have acute on chronic hypoxic respiratory failure due to COVID-19 pneumonia.  Her husband was also recently diagnosed with COVID-19 pneumonia.  Patient was subsequently admitted to the hospitalist service-post hospitalization-hypoxia worsened-she required up to 10 L of HFNC.  She was started on baricitinib-Solu-Medrol dosage was escalated to 2 mg/kg-with these measures-she rapidly improved-and was able to be titrated down to just 2 L of oxygen as of 11/4.  Plans are to continue to taper down steroids-continue baricitinib-and continue to monitor for the next few days before consideration of discharge  Subjective:  Patient in bed, appears comfortable, denies any headache, no fever, no chest pain or pressure, improving shortness of breath , no abdominal pain. No new focal weakness.   Objective: Vitals: Blood pressure (!) 107/48, pulse 63, temperature 98 F (36.7 C), temperature source Oral, resp. rate 17, height 5' 2.5" (1.588 m), weight 92.5 kg, SpO2 95 %.   Exam:  Awake Alert, No new F.N deficits, Normal affect [place].AT,PERRAL Supple Neck, No JVD,   Symmetrical Chest wall movement, Good air movement bilaterally, CTAB RRR,No Gallops, Rubs or new Murmurs,  +ve B.Sounds, Abd Soft, No tenderness,   No Cyanosis, Clubbing or edema    Pertinent Labs/Radiology:  11/1>> CT angiogram of the chest: No PE-widespread pulmonary infiltrates.    Assessment/Plan:  Acute on chronic hypoxic respiratory failure due to COVID-19  pneumonia versus lupus associated ILD flare: Hypoxia has improved-down to 2 L/min-decrease steroids to 90 mg daily (1 mg/kg).  Remains on baricitinib.  No signs of volume overload-do not think patient requires diuretics.  Continue mobilization/incentive spirometry.  Continue close monitoring- will require a slow taper of steroids back to her usual home regimen of 2.5 mg of prednisone daily.  She will need to follow-up with a outpatient pulmonologist at [facility]   History of lupus-ILD (associated with lupus) with chronic hypoxic respiratory failure on 2-3 L of oxygen at home: Maintained on prednisone 2.5 mg daily, Plaquenil and Bactrim for PJP prophylaxis. Gets Rituxan infusion every 6 months (due for another infusion late November).    AKI: Mild-likely hemodynamically mediated-diuretics held-AKI improved-follow.  Hypothyroidism: Continue Synthroid  HTN: BP stable-continue to hold all antihypertensives for now.  Depression/anxiety: Continues to have some anxiety-we will switch to Xanax per patient's request-remains on Zoloft.   Goals of care: DNR reconfirmed with patient-and subsequently with her daughter Jacqlyn Larsen over the phone on 11/2.  Patient very clear that she wants to continue treatment-however if she was to continue worsening-and exhibit signs of suffering-she does not want to prolong the situation at that point-and would want to transition to hospice/comfort measures.  Obesity: Estimated body mass index is 36.72 kg/m as calculated from the following:   Height as of this encounter: 5' 2.5" (1.588 m).   Weight as of this encounter: 92.5 kg.     Procedures: None Consults: PCCM DVT Prophylaxis: Lovenox Code Status: DNR Family Communication:   Spoke  with son over the phone-on 02/02/21 Daughter Becky-[phone]-updated at bedside on 11/3.  Time spent:  25 minutes-Greater than 50% of this time was spent in counseling, explanation of diagnosis, planning of further management, and  coordination of care.   Disposition Plan: Status is: Inpatient  Remains inpatient appropriate because: Severe hypoxia related to COVID-19 infection-on IV steroids/immunomodulators/Remdesivir.  Not yet stable for discharge.    Diet: Diet Order             Diet regular Room service appropriate? Yes; Fluid consistency: Thin  Diet effective now                     Antimicrobial agents: Anti-infectives (From admission, onward)    Start     Dose/Rate Route Frequency Ordered Stop   01/31/21 1000  sulfamethoxazole-trimethoprim (BACTRIM DS) 800-160 MG per tablet 1 tablet        1 tablet Oral Every M-W-F 01/30/21 0655     01/31/21 1000  remdesivir 100 mg in sodium chloride 0.9 % 100 mL IVPB       See Hyperspace for full Linked Orders Report.   100 mg 200 mL/hr over 30 Minutes Intravenous Daily 01/30/21 0655 02/03/21 1021   01/30/21 1000  hydroxychloroquine (PLAQUENIL) tablet 400 mg        400 mg Oral Daily 01/30/21 0655     01/30/21 0800  remdesivir 200 mg in sodium chloride 0.9% 250 mL IVPB       See Hyperspace for full Linked Orders Report.   200 mg 580 mL/hr over 30 Minutes Intravenous Once 01/30/21 0655 01/30/21 0940        MEDICATIONS: Scheduled Meds:  albuterol  2 puff Inhalation Q6H   baricitinib  4 mg Oral Daily   benzonatate  200 mg Oral TID   enoxaparin (LOVENOX) injection  40 mg Subcutaneous Q24H   hydroxychloroquine  400 mg Oral Daily   levothyroxine  50 mcg Oral Q0600   melatonin  5 mg Oral QHS   menthol-cetylpyridinium  1 lozenge Oral 5 X Daily   methylPREDNISolone (SOLU-MEDROL) injection  60 mg Intravenous Q12H   mometasone-formoterol  2 puff Inhalation BID   pantoprazole  40 mg Oral Daily   potassium chloride  40 mEq Oral Once   sertraline  100 mg Oral Daily   sulfamethoxazole-trimethoprim  1 tablet Oral Q M,W,F   Continuous Infusions:   PRN Meds:.acetaminophen **OR** acetaminophen, albuterol, ALPRAZolam, guaiFENesin-dextromethorphan, hydrOXYzine,  ondansetron (ZOFRAN) IV   I have personally reviewed following labs and imaging studies  LABORATORY DATA:  Recent Labs  Lab 01/30/21 1109 01/31/21 0244 02/01/21 0355 02/02/21 0319 02/03/21 0700  WBC 4.7 9.5 9.3 5.7 7.2  HGB 12.1 12.1 11.6* 11.2* 11.5*  HCT 37.3 38.2 35.5* 33.4* 35.6*  PLT 236 270 289 261 280  MCV 88.6 87.8 86.2 85.4 86.8  MCH 28.7 27.8 28.2 28.6 28.0  MCHC 32.4 31.7 32.7 33.5 32.3  RDW 13.6 13.2 13.2 13.2 13.4  LYMPHSABS  --  0.7 0.9 0.6* 1.0  MONOABS  --  0.5 0.3 0.3 0.6  EOSABS  --  0.0 0.0 0.0 0.0  BASOSABS  --  0.0 0.0 0.0 0.0    Recent Labs  Lab 01/29/21 2004 01/30/21 0926 01/30/21 1109 01/31/21 0244 02/01/21 0355 02/02/21 0319 02/03/21 0700 02/03/21 0713  NA 138  --   --  138 138 135 138  --   K 3.3*  --   --  3.9 3.9 3.0* 3.2*  --  CL 100  --   --  102 102 102 101  --   CO2 28  --   --  25 28 26 28   --   GLUCOSE 105*  --   --  140* 152* 197* 103*  --   BUN 18  --   --  23 26* 25* 24*  --   CREATININE 1.10*  --  1.02* 1.04* 1.28* 1.09* 1.38*  --   CALCIUM 8.7*  --   --  8.7* 8.4* 8.4* 8.5*  --   AST  --   --   --  17 13* 12* 18  --   ALT  --   --   --  15 13 10 11   --   ALKPHOS  --   --   --  82 74 73 69  --   BILITOT  --   --   --  0.5 0.2* 0.3 0.6  --   ALBUMIN  --   --   --  2.8* 2.6* 2.7* 2.9*  --   MG  --   --   --   --   --   --   --  2.2  CRP  --   --  13.2* 11.4* 9.3* 4.1* 1.2*  --   DDIMER  --  3.04*  --  0.99* 1.36* 0.48 1.00*  --   PROCALCITON  --   --  <0.10  --   --   --   --   --           RADIOLOGY STUDIES/RESULTS: No results found.   LOS: 4 days   Signature  Lala Lund M.D on 02/03/2021 at 10:33 AM   -  To page go to [email or web address]

## 2021-02-04 LAB — C-REACTIVE PROTEIN: CRP: 0.8 mg/dL (ref <1.0)

## 2021-02-04 LAB — CBC WITH DIFFERENTIAL/PLATELET
Abs Immature Granulocytes: 0.29 10*3/uL — ABNORMAL HIGH (ref 0.00–0.07)
Basophils Absolute: 0 10*3/uL (ref 0.0–0.1)
Basophils Relative: 0 %
Eosinophils Absolute: 0 10*3/uL (ref 0.0–0.5)
Eosinophils Relative: 0 %
HCT: 32.6 % — ABNORMAL LOW (ref 36.0–46.0)
Hemoglobin: 10.9 g/dL — ABNORMAL LOW (ref 12.0–15.0)
Immature Granulocytes: 4 %
Lymphocytes Relative: 5 %
Lymphs Abs: 0.4 10*3/uL — ABNORMAL LOW (ref 0.7–4.0)
MCH: 28.8 pg (ref 26.0–34.0)
MCHC: 33.4 g/dL (ref 30.0–36.0)
MCV: 86 fL (ref 80.0–100.0)
Monocytes Absolute: 0.2 10*3/uL (ref 0.1–1.0)
Monocytes Relative: 3 %
Neutro Abs: 7.4 10*3/uL (ref 1.7–7.7)
Neutrophils Relative %: 88 %
Platelets: 251 10*3/uL (ref 150–400)
RBC: 3.79 MIL/uL — ABNORMAL LOW (ref 3.87–5.11)
RDW: 13.3 % (ref 11.5–15.5)
WBC: 8.4 10*3/uL (ref 4.0–10.5)
nRBC: 0 % (ref 0.0–0.2)

## 2021-02-04 LAB — COMPREHENSIVE METABOLIC PANEL
ALT: 14 U/L (ref 0–44)
AST: 17 U/L (ref 15–41)
Albumin: 2.6 g/dL — ABNORMAL LOW (ref 3.5–5.0)
Alkaline Phosphatase: 62 U/L (ref 38–126)
Anion gap: 4 — ABNORMAL LOW (ref 5–15)
BUN: 23 mg/dL (ref 8–23)
CO2: 25 mmol/L (ref 22–32)
Calcium: 8.1 mg/dL — ABNORMAL LOW (ref 8.9–10.3)
Chloride: 108 mmol/L (ref 98–111)
Creatinine, Ser: 1.05 mg/dL — ABNORMAL HIGH (ref 0.44–1.00)
GFR, Estimated: 58 mL/min — ABNORMAL LOW (ref >60)
Glucose, Bld: 190 mg/dL — ABNORMAL HIGH (ref 70–99)
Potassium: 4.3 mmol/L (ref 3.5–5.1)
Sodium: 137 mmol/L (ref 135–145)
Total Bilirubin: 0.3 mg/dL (ref 0.3–1.2)
Total Protein: 4.7 g/dL — ABNORMAL LOW (ref 6.5–8.1)

## 2021-02-04 LAB — D-DIMER, QUANTITATIVE: D-Dimer, Quant: 0.54 ug/mL-FEU — ABNORMAL HIGH (ref 0.00–0.50)

## 2021-02-04 LAB — BRAIN NATRIURETIC PEPTIDE: B Natriuretic Peptide: 259 pg/mL — ABNORMAL HIGH (ref 0.0–100.0)

## 2021-02-04 LAB — MAGNESIUM: Magnesium: 2.2 mg/dL (ref 1.7–2.4)

## 2021-02-04 MED ORDER — METHYLPREDNISOLONE SODIUM SUCC 40 MG IJ SOLR
40.0000 mg | Freq: Every day | INTRAMUSCULAR | Status: DC
  Administered 2021-02-05: 40 mg via INTRAVENOUS
  Filled 2021-02-04: qty 1

## 2021-02-04 NOTE — Plan of Care (Signed)
  Problem: Education: Goal: Knowledge of General Education information will improve Description: Including pain rating scale, medication(s)/side effects and non-pharmacologic comfort measures 02/04/2021 2339 by Valora Corporal, RN Outcome: Progressing 02/04/2021 2101 by Valora Corporal, RN Outcome: Progressing   Problem: Health Behavior/Discharge Planning: Goal: Ability to manage health-related needs will improve 02/04/2021 2339 by Valora Corporal, RN Outcome: Progressing 02/04/2021 2101 by Valora Corporal, RN Outcome: Progressing   Problem: Clinical Measurements: Goal: Ability to maintain clinical measurements within normal limits will improve 02/04/2021 2339 by Valora Corporal, RN Outcome: Progressing 02/04/2021 2101 by Valora Corporal, RN Outcome: Progressing Goal: Will remain free from infection 02/04/2021 2339 by Valora Corporal, RN Outcome: Progressing 02/04/2021 2101 by Valora Corporal, RN Outcome: Progressing Goal: Diagnostic test results will improve 02/04/2021 2339 by Valora Corporal, RN Outcome: Progressing 02/04/2021 2101 by Valora Corporal, RN Outcome: Progressing Goal: Respiratory complications will improve 02/04/2021 2339 by Valora Corporal, RN Outcome: Progressing 02/04/2021 2101 by Valora Corporal, RN Outcome: Progressing Goal: Cardiovascular complication will be avoided 02/04/2021 2339 by Valora Corporal, RN Outcome: Progressing 02/04/2021 2101 by Valora Corporal, RN Outcome: Progressing   Problem: Activity: Goal: Risk for activity intolerance will decrease 02/04/2021 2339 by Valora Corporal, RN Outcome: Progressing 02/04/2021 2101 by Valora Corporal, RN Outcome: Progressing   Problem: Nutrition: Goal: Adequate nutrition will be maintained 02/04/2021 2339 by Valora Corporal, RN Outcome: Progressing 02/04/2021 2101 by Valora Corporal, RN Outcome: Progressing   Problem:  Coping: Goal: Level of anxiety will decrease 02/04/2021 2339 by Valora Corporal, RN Outcome: Progressing 02/04/2021 2101 by Valora Corporal, RN Outcome: Progressing   Problem: Elimination: Goal: Will not experience complications related to bowel motility 02/04/2021 2339 by Valora Corporal, RN Outcome: Progressing 02/04/2021 2101 by Valora Corporal, RN Outcome: Progressing Goal: Will not experience complications related to urinary retention 02/04/2021 2339 by Valora Corporal, RN Outcome: Progressing 02/04/2021 2101 by Valora Corporal, RN Outcome: Progressing   Problem: Pain Managment: Goal: General experience of comfort will improve 02/04/2021 2339 by Valora Corporal, RN Outcome: Progressing 02/04/2021 2101 by Valora Corporal, RN Outcome: Progressing   Problem: Safety: Goal: Ability to remain free from injury will improve 02/04/2021 2339 by Valora Corporal, RN Outcome: Progressing 02/04/2021 2101 by Valora Corporal, RN Outcome: Progressing   Problem: Skin Integrity: Goal: Risk for impaired skin integrity will decrease 02/04/2021 2339 by Valora Corporal, RN Outcome: Progressing 02/04/2021 2101 by Valora Corporal, RN Outcome: Progressing   Problem: Education: Goal: Knowledge of risk factors and measures for prevention of condition will improve 02/04/2021 2339 by Valora Corporal, RN Outcome: Progressing 02/04/2021 2101 by Valora Corporal, RN Outcome: Progressing   Problem: Coping: Goal: Psychosocial and spiritual needs will be supported 02/04/2021 2339 by Valora Corporal, RN Outcome: Progressing 02/04/2021 2101 by Valora Corporal, RN Outcome: Progressing   Problem: Respiratory: Goal: Will maintain a patent airway 02/04/2021 2339 by Valora Corporal, RN Outcome: Progressing 02/04/2021 2101 by Valora Corporal, RN Outcome: Progressing Goal: Complications related to the disease process,  condition or treatment will be avoided or minimized 02/04/2021 2339 by Valora Corporal, RN Outcome: Progressing 02/04/2021 2101 by Valora Corporal, RN Outcome: Progressing

## 2021-02-04 NOTE — Plan of Care (Signed)

## 2021-02-04 NOTE — Progress Notes (Signed)
PROGRESS NOTE        PATIENT DETAILS Name: Dana Doyle Age: 70 y.o. Sex: female Date of Birth: 02-Aug-1950 Admit Date: 01/29/2021 Admitting Physician Rise Patience, MD CHY:IFOYD, Alyson Locket, NP  Brief Narrative: Patient is a 70 y.o. female with history of SLE, ILD associated with SLE, chronic hypoxic respiratory failure on 2 L of oxygen at home-presenting with 2-week history of fatigue/shortness of breath and cough.  She was found to have acute on chronic hypoxic respiratory failure due to COVID-19 pneumonia.  Her husband was also recently diagnosed with COVID-19 pneumonia.  Patient was subsequently admitted to the hospitalist service-post hospitalization-hypoxia worsened-she required up to 10 L of HFNC.  She was started on baricitinib-Solu-Medrol dosage was escalated to 2 mg/kg-with these measures-she rapidly improved-and was able to be titrated down to just 2 L of oxygen as of 11/4.  Plans are to continue to taper down steroids-continue baricitinib-and continue to monitor for the next few days before consideration of discharge  Subjective:  Patient in bed, appears comfortable, denies any headache, no fever, no chest pain or pressure, much better shortness of breath , no abdominal pain. No new focal weakness.    Objective: Vitals: Blood pressure (!) 117/59, pulse 67, temperature 97.9 F (36.6 C), temperature source Oral, resp. rate 20, height 5' 2.5" (1.588 m), weight 92.5 kg, SpO2 95 %.   Exam:  Awake Alert, No new F.N deficits, Normal affect [place].AT,PERRAL Supple Neck, No JVD,   Symmetrical Chest wall movement, Good air movement bilaterally, CTAB RRR,No Gallops, Rubs or new Murmurs,  +ve B.Sounds, Abd Soft, No tenderness,   No Cyanosis, Clubbing or edema     Pertinent Labs/Radiology:  11/1>> CT angiogram of the chest: No PE-widespread pulmonary infiltrates.    Assessment/Plan:  Acute on chronic hypoxic respiratory failure due to COVID-19  pneumonia versus lupus associated ILD flare: Hypoxia has improved-down to 2 L/min-decrease steroids to 90 mg daily (1 mg/kg).  Remains on baricitinib.  No signs of volume overload-do not think patient requires diuretics.  Continue mobilization/incentive spirometry.  Continue close monitoring- will require a slow taper of steroids back to her usual home regimen of 2.5 mg of prednisone daily.  She will need to follow-up with a outpatient pulmonologist at [facility].   History of lupus-ILD (associated with lupus) with chronic hypoxic respiratory failure on 2-3 L of oxygen at home: Maintained on prednisone 2.5 mg daily, Plaquenil and Bactrim for PJP prophylaxis. Gets Rituxan infusion every 6 months (due for another infusion late November).    AKI: Mild-likely hemodynamically mediated-diuretics held-AKI improved-follow.  Hypothyroidism: Continue Synthroid  HTN: BP stable-continue to hold all antihypertensives for now.  Depression/anxiety: Continues to have some anxiety-we will switch to Xanax per patient's request-remains on Zoloft.   Goals of care: DNR reconfirmed with patient-and subsequently with her daughter Jacqlyn Larsen over the phone on 11/2.  Patient very clear that she wants to continue treatment-however if she was to continue worsening-and exhibit signs of suffering-she does not want to prolong the situation at that point-and would want to transition to hospice/comfort measures.  Obesity: Estimated body mass index is 36.72 kg/m as calculated from the following:   Height as of this encounter: 5' 2.5" (1.588 m).   Weight as of this encounter: 92.5 kg.     Procedures: None Consults: PCCM DVT Prophylaxis: Lovenox Code Status: DNR Family Communication:  Spoke with son over the phone-on 02/02/21 Daughter Becky-[phone]-updated at bedside on 11/3.  Time spent:  25 minutes-Greater than 50% of this time was spent in counseling, explanation of diagnosis, planning of further management, and  coordination of care.   Disposition Plan: Status is: Inpatient  Remains inpatient appropriate because: Severe hypoxia related to COVID-19 infection-on IV steroids/immunomodulators/Remdesivir.  Not yet stable for discharge.    Diet: Diet Order             Diet regular Room service appropriate? Yes; Fluid consistency: Thin  Diet effective now                     Antimicrobial agents: Anti-infectives (From admission, onward)    Start     Dose/Rate Route Frequency Ordered Stop   01/31/21 1000  sulfamethoxazole-trimethoprim (BACTRIM DS) 800-160 MG per tablet 1 tablet        1 tablet Oral Every M-W-F 01/30/21 0655     01/31/21 1000  remdesivir 100 mg in sodium chloride 0.9 % 100 mL IVPB       See Hyperspace for full Linked Orders Report.   100 mg 200 mL/hr over 30 Minutes Intravenous Daily 01/30/21 0655 02/03/21 1021   01/30/21 1000  hydroxychloroquine (PLAQUENIL) tablet 400 mg        400 mg Oral Daily 01/30/21 0655     01/30/21 0800  remdesivir 200 mg in sodium chloride 0.9% 250 mL IVPB       See Hyperspace for full Linked Orders Report.   200 mg 580 mL/hr over 30 Minutes Intravenous Once 01/30/21 0655 01/30/21 0940        MEDICATIONS: Scheduled Meds:  albuterol  2 puff Inhalation Q6H   baricitinib  4 mg Oral Daily   benzonatate  200 mg Oral TID   enoxaparin (LOVENOX) injection  40 mg Subcutaneous Q24H   hydroxychloroquine  400 mg Oral Daily   levothyroxine  50 mcg Oral Q0600   melatonin  5 mg Oral QHS   menthol-cetylpyridinium  1 lozenge Oral 5 X Daily   methylPREDNISolone (SOLU-MEDROL) injection  60 mg Intravenous Q12H   mometasone-formoterol  2 puff Inhalation BID   pantoprazole  40 mg Oral Daily   sertraline  100 mg Oral Daily   sulfamethoxazole-trimethoprim  1 tablet Oral Q M,W,F   Continuous Infusions:   PRN Meds:.acetaminophen **OR** acetaminophen, albuterol, ALPRAZolam, guaiFENesin-dextromethorphan, hydrOXYzine, ondansetron (ZOFRAN) IV   I have  personally reviewed following labs and imaging studies  LABORATORY DATA:  Recent Labs  Lab 01/31/21 0244 02/01/21 0355 02/02/21 0319 02/03/21 0700 02/04/21 0409  WBC 9.5 9.3 5.7 7.2 8.4  HGB 12.1 11.6* 11.2* 11.5* 10.9*  HCT 38.2 35.5* 33.4* 35.6* 32.6*  PLT 270 289 261 280 251  MCV 87.8 86.2 85.4 86.8 86.0  MCH 27.8 28.2 28.6 28.0 28.8  MCHC 31.7 32.7 33.5 32.3 33.4  RDW 13.2 13.2 13.2 13.4 13.3  LYMPHSABS 0.7 0.9 0.6* 1.0 0.4*  MONOABS 0.5 0.3 0.3 0.6 0.2  EOSABS 0.0 0.0 0.0 0.0 0.0  BASOSABS 0.0 0.0 0.0 0.0 0.0    Recent Labs  Lab 01/30/21 1109 01/31/21 0244 02/01/21 0355 02/02/21 0319 02/03/21 0700 02/03/21 0713 02/04/21 0409  NA  --  138 138 135 138  --  137  K  --  3.9 3.9 3.0* 3.2*  --  4.3  CL  --  102 102 102 101  --  108  CO2  --  25 28 26 28   --  25  GLUCOSE  --  140* 152* 197* 103*  --  190*  BUN  --  23 26* 25* 24*  --  23  CREATININE 1.02* 1.04* 1.28* 1.09* 1.38*  --  1.05*  CALCIUM  --  8.7* 8.4* 8.4* 8.5*  --  8.1*  AST  --  17 13* 12* 18  --  17  ALT  --  15 13 10 11   --  14  ALKPHOS  --  82 74 73 69  --  62  BILITOT  --  0.5 0.2* 0.3 0.6  --  0.3  ALBUMIN  --  2.8* 2.6* 2.7* 2.9*  --  2.6*  MG  --   --   --   --   --  2.2 2.2  CRP 13.2* 11.4* 9.3* 4.1* 1.2*  --  0.8  DDIMER  --  0.99* 1.36* 0.48 1.00*  --  0.54*  PROCALCITON <0.10  --   --   --   --   --   --   BNP  --   --   --   --   --   --  259.0*       RADIOLOGY STUDIES/RESULTS: DG CHEST PORT 1 VIEW  Result Date: 02/03/2021 CLINICAL DATA:  Short of breath.  Pleural effusion EXAM: PORTABLE CHEST 1 VIEW COMPARISON:  02/01/2021 FINDINGS: Cardiac enlargement.  Negative for heart failure or edema. Small left apical pneumothorax. Elevated right hemidiaphragm with right lower lobe atelectasis unchanged. Mild left lower lobe atelectasis unchanged. Negative for pleural effusion. IMPRESSION: Mild bibasilar atelectasis unchanged. Small left apical pneumothorax. Electronically Signed   By: Franchot Gallo M.D.   On: 02/03/2021 12:00     LOS: 5 days   Signature  Lala Lund M.D on 02/04/2021 at 10:22 AM   -  To page go to [email or web address]

## 2021-02-05 ENCOUNTER — Other Ambulatory Visit (HOSPITAL_COMMUNITY): Payer: Self-pay

## 2021-02-05 MED ORDER — PREDNISONE 2.5 MG PO TABS: 2.5000 mg | ORAL_TABLET | Freq: Every day | ORAL | Status: DC

## 2021-02-05 MED ORDER — PREDNISONE 5 MG PO TABS
ORAL_TABLET | ORAL | 0 refills | Status: DC
  Filled 2021-02-05: qty 65, 18d supply, fill #0

## 2021-02-05 NOTE — TOC Transition Note (Addendum)
Transition of Care [facility]) - CM/SW Discharge Note   Patient Details  Name: Dana Doyle MRN: [id number] Date of Birth: 1950-04-03  [facility] CM/SW Contact:  Sharin Mons, RN Phone Number: 02/05/2021, 10:50 AM   Clinical Narrative:    Patient will DC to: home Anticipated DC date: 02/05/2021 Family notified: yes Transport by: car  Presented with SOB/ (+) COVID. Home oxygen dependent. Per MD patient ready for DC today. RN, patient, and patient's family notified of DC.   Pt with home health RN order. Pt  agreeable to home health services. Choice provided. Pt  without preference. Referral made with [facility] and accepted. Pt without DME needs. Daughter to bring portable oxygen tank to bedside for transport to home.  Rx meds will be delivered to beside prior to discharge by [facility] pharmacy.  Post hospital f/u noted on AVS. Daughter to provide transportation to home.   RNCM will sign off for now as intervention is no longer needed. Please consult [place] again if new needs arise.   Final next level of care: Home/Self Care Barriers to Discharge: No Barriers Identified   Patient Goals and CMS Choice     Choice offered to / list presented to : Patient  Discharge Placement                       Discharge Plan and Services   Discharge Planning Services: CM Consult                      HH Arranged: RN, Disease Management [place] Agency: [place] Date [place]: 02/05/21 Time [place]: 1 Representative spoke with at [place]: [place] Interventions     Readmission Risk Interventions No flowsheet data found.

## 2021-02-05 NOTE — Discharge Summary (Signed)
Dana Doyle CHY:[id number] DOB: 12-14-1950 DOA: 01/29/2021  PCP: Michela Pitcher, NP  Admit date: 01/29/2021  Discharge date: 02/05/2021  Admitted From: Home   Disposition:  Home   Recommendations for Outpatient Follow-up:   Follow up with PCP in 1-2 weeks  PCP Please obtain BMP/CBC, 2 view CXR in 1week,  (see Discharge instructions)   PCP Please follow up on the following pending results:    Home Health: None Equipment/Devices: None  Consultations: None  Discharge Condition: Stable    CODE STATUS: Full    Diet Recommendation: Heart Healthy   Diet Order             Diet - low sodium heart healthy           Diet regular Room service appropriate? Yes; Fluid consistency: Thin  Diet effective now                    Chief Complaint  Patient presents with   Shortness of Breath     Brief history of present illness from the day of admission and additional interim summary    Patient is a 70 y.o. female with history of SLE, ILD associated with SLE, chronic hypoxic respiratory failure on 2 L of oxygen at home-presenting with 2-week history of fatigue/shortness of breath and cough.  She was found to have acute on chronic hypoxic respiratory failure due to COVID-19 pneumonia.  Her husband was also recently diagnosed with COVID-19 pneumonia.   Patient was subsequently admitted to the hospitalist service-post hospitalization-hypoxia worsened-she required up to 10 L of HFNC.  She was started on baricitinib-Solu-Medrol with good improvement, now on 2 L nasal cannula oxygen and symptom-free, this is her home oxygen dose.                                                                 Hospital Course    Acute on chronic hypoxic respiratory failure due to COVID-19 pneumonia versus lupus associated ILD flare:  Hypoxia has improved-down to 2 L/min-decrease she was treated with combination of Baricitinib and IV steroids, much improved now, she has received total of 6 days of Baricitinib now stable at 2 L nasal cannula oxygen which is her home dose and symptom-free.  Will be placed on oral steroid taper with outpatient follow-up with PCP and her pulmonologist in 1 to 2 weeks.  Post steroid taper she will continue her home steroid regimen per home dose.    SpO2: 92 % O2 Flow Rate (L/min): 2 L/min   History of lupus-ILD (associated with lupus) with chronic hypoxic respiratory failure on 2-3 L of oxygen at home: Maintained on prednisone 2.5 mg daily, Plaquenil and Bactrim for PJP prophylaxis. Gets Rituxan infusion every 6 months (due for another infusion late November).  AKI: Mild-likely hemodynamically mediated-diuretics held-AKI improved- PCP to follow.   Hypothyroidism: Continue Synthroid   HTN: BP - Home Rx.   Depression/anxiety: Continues to have some anxiety-we will switch to Xanax per patient's request-remains on Zoloft.    Obesity:  Estimated body mass index is 36.72, follow with PCP for weight loss   Discharge diagnosis     Principal Problem:   Acute respiratory failure due to COVID-19 [facility]) Active Problems:   DM2 (diabetes mellitus, type 2) (HCC)   Hypothyroidism   Hypercholesterolemia   SLE (systemic lupus erythematosus related syndrome) (Combine)   Chronic respiratory failure ([place])    Discharge instructions    Discharge Instructions     Diet - low sodium heart healthy   Complete by: As directed    Discharge instructions   Complete by: As directed    Follow with Primary MD Michela Pitcher, NP and your lung doctor in 7 days   Get CBC, CMP, 2 view Chest X ray -  checked next visit within 1 week by Primary MD   Activity: As tolerated with Full fall precautions use walker/cane & assistance as needed  Disposition Home   Diet: Heart Healthy    Special Instructions: If you  have smoked or chewed Tobacco  in the last 2 yrs please stop smoking, stop any regular Alcohol  and or any Recreational drug use.  On your next visit with your primary care physician please Get Medicines reviewed and adjusted.  Please request your Prim.MD to go over all Hospital Tests and Procedure/Radiological results at the follow up, please get all Hospital records sent to your Prim MD by signing hospital release before you go home.  If you experience worsening of your admission symptoms, develop shortness of breath, life threatening emergency, suicidal or homicidal thoughts you must seek medical attention immediately by calling 911 or calling your MD immediately  if symptoms less severe.  You Must read complete instructions/literature along with all the possible adverse reactions/side effects for all the Medicines you take and that have been prescribed to you. Take any new Medicines after you have completely understood and accpet all the possible adverse reactions/side effects.   Increase activity slowly   Complete by: As directed    MyChart COVID-19 home monitoring program   Complete by: Feb 05, 2021    Is the patient willing to use the [place] for home monitoring?: Yes   Temperature monitoring   Complete by: Feb 05, 2021    After how many days would you like to receive a notification of this patient's flowsheet entries?: 1       Discharge Medications   Allergies as of 02/05/2021       Reactions   Belimumab Other (See Comments), Nausea And Vomiting   Mycophenolate Mofetil Other (See Comments), Nausea And Vomiting   Phenergan [promethazine Hcl] Anaphylaxis   Codeine Hives, Itching, Other (See Comments)   Meloxicam Hives   Statins Other (See Comments)   Muscle spasm and cramps   Ezetimibe    Tape    Other reaction(s): Other (See Comments) Very thin skin due to chronic steroid use         Medication List     TAKE these medications    acetaminophen 500 MG  tablet Commonly known as: TYLENOL Take 500 mg by mouth every 6 (six) hours as needed for moderate pain or headache.   albuterol 108 (90 Base) MCG/ACT inhaler Commonly known as: VENTOLIN HFA Inhale 1-2 puffs  into the lungs every 6 (six) hours as needed for wheezing or shortness of breath.   budesonide-formoterol 160-4.5 MCG/ACT inhaler Commonly known as: SYMBICORT Inhale 2 puffs into the lungs in the morning and at bedtime.   diazepam 5 MG tablet Commonly known as: VALIUM TAKE 1 TABLET BY MOUTH DAILY AS NEEDED FOR ANXIETY. What changed:  reasons to take this additional instructions   furosemide 20 MG tablet Commonly known as: LASIX TAKE 1 TABLET BY MOUTH TWICE A DAY What changed: when to take this   hydroxychloroquine 200 MG tablet Commonly known as: PLAQUENIL Take 400 mg by mouth daily.   hydrOXYzine 25 MG capsule Commonly known as: VISTARIL Take 1 capsule (25 mg total) by mouth daily as needed. What changed: reasons to take this   levothyroxine 50 MCG tablet Commonly known as: SYNTHROID TAKE 1 TABLET BY MOUTH EVERY DAY BEFORE BREAKFAST What changed:  how much to take how to take this when to take this additional instructions   metoprolol succinate 25 MG 24 hr tablet Commonly known as: TOPROL-XL TAKE 1 TABLET (25 MG TOTAL) BY MOUTH DAILY.   NIFEdipine 30 MG 24 hr tablet Commonly known as: ADALAT CC Take 30 mg by mouth daily.   ondansetron 4 MG tablet Commonly known as: ZOFRAN Take 4 mg by mouth every 8 (eight) hours as needed for nausea or vomiting.   OXYGEN Inhale 2.5-3 L into the lungs as needed.   pantoprazole 40 MG tablet Commonly known as: PROTONIX TAKE 1 TABLET BY MOUTH EVERY DAY   Potassium 99 MG Tabs Take 99 mg by mouth daily.   predniSONE 2.5 MG tablet Commonly known as: DELTASONE Take 1 tablet (2.5 mg total) by mouth daily. Resume your home dose once you have finished the given steroid taper. What changed: additional instructions    predniSONE 5 MG tablet Commonly known as: DELTASONE Label  & dispense according to the schedule below. take 8 Pills PO for 3 days, 6 Pills PO for 3 days, 4 Pills PO for 3 days, 2 Pills PO for 3 days, 1 Pills PO for 3 days, then to your home dose as before. Total 65 pills. Start taking on: February 06, 2021 What changed: You were already taking a medication with the same name, and this prescription was added. Make sure you understand how and when to take each.   RITUXAN IV Inject into the vein every 6 (six) months.   sertraline 100 MG tablet Commonly known as: ZOLOFT TAKE 1 TABLET BY MOUTH EVERY DAY   sulfamethoxazole-trimethoprim 800-160 MG tablet Commonly known as: BACTRIM DS Take 1 tablet by mouth every Monday, Wednesday, and Friday.   Vitamin D3 250 MCG (10000 UT) capsule Take 10,000 Units by mouth daily.   WOMENS MULTIVITAMIN PO Take 1 tablet by mouth daily.         Follow-up Information     Michela Pitcher, NP. Schedule an appointment as soon as possible for a visit in 1 week(s).   Specialty: Pain Medicine Why: And with your lung doctor in a week Contact information: [address] [place] [address] [phone]                 Major procedures and Radiology Reports - PLEASE review detailed and final reports thoroughly  -     DG Chest 2 View  Result Date: 01/29/2021 CLINICAL DATA:  Shortness of breath.  Lupus. EXAM: CHEST - 2 VIEW COMPARISON:  Chest x-ray 08/10/2005 FINDINGS: The heart  and mediastinal contours are within normal limits. No focal consolidation. Increased interstitial markings and patchy airspace opacities. No pleural effusion. No pneumothorax. No acute osseous abnormality. IMPRESSION: Increased interstitial markings and patchy airspace opacities which may represent infection or inflammation. Followup PA and lateral chest X-ray is recommended in 3-4 weeks following therapy to ensure resolution. Electronically Signed   By: Iven Finn  M.D.   On: 01/29/2021 20:47   CT Angio Chest PE W and/or Wo Contrast  Result Date: 01/30/2021 CLINICAL DATA:  Dyspnea, cough, lupus EXAM: CT ANGIOGRAPHY CHEST WITH CONTRAST TECHNIQUE: Multidetector CT imaging of the chest was performed using the standard protocol during bolus administration of intravenous contrast. Multiplanar CT image reconstructions and MIPs were obtained to evaluate the vascular anatomy. CONTRAST:  76mL OMNIPAQUE IOHEXOL 350 MG/ML SOLN COMPARISON:  Chest radiograph 01/29/2021 and 08/10/2005 FINDINGS: Cardiovascular: There is adequate opacification of the pulmonary arterial tree. No intraluminal filling defect identified to suggest acute pulmonary embolism. The central pulmonary arteries are of normal caliber. Cardiac size within normal limits. No significant coronary artery calcification. No pericardial effusion. Mild atherosclerotic calcification is seen within the thoracic aorta. No aortic aneurysm. Mediastinum/Nodes: No pathologic thoracic adenopathy. The thyroid gland is atrophic. Esophagus unremarkable. Lungs/Pleura: There is extensive, diffuse, asymmetric ground-glass pulmonary infiltrate and pulmonary consolidation. Within these regions, are interspersed areas of relatively preserved lung. Differential considerations in the acute setting include atypical infection, including mycoplasma pneumoniae infection and acute lung injury. If chronic, additional considerations include hypersensitivity pneumonitis, chronic eosinophilic pneumonia or cryptogenic organizing pneumonia. No pneumothorax or pleural effusion. Central airways are widely patent. Upper Abdomen: Status post cholecystectomy.  No acute abnormality. Musculoskeletal: No acute bone abnormality. No lytic or blastic bone lesion. Review of the MIP images confirms the above findings. IMPRESSION: No pulmonary embolism. Widespread pulmonary infiltrates, new since prior chest radiograph of 08/10/2005. Differential considerations as  listed above. Electronically Signed   By: Fidela Salisbury M.D.   On: 01/30/2021 03:07   DG CHEST PORT 1 VIEW  Result Date: 02/03/2021 CLINICAL DATA:  Short of breath.  Pleural effusion EXAM: PORTABLE CHEST 1 VIEW COMPARISON:  02/01/2021 FINDINGS: Cardiac enlargement.  Negative for heart failure or edema. Small left apical pneumothorax. Elevated right hemidiaphragm with right lower lobe atelectasis unchanged. Mild left lower lobe atelectasis unchanged. Negative for pleural effusion. IMPRESSION: Mild bibasilar atelectasis unchanged. Small left apical pneumothorax. Electronically Signed   By: Franchot Gallo M.D.   On: 02/03/2021 12:00   DG CHEST PORT 1 VIEW  Result Date: 02/01/2021 CLINICAL DATA:  Shortness of breath, COVID, lupus EXAM: PORTABLE CHEST 1 VIEW COMPARISON:  Multiple priors FINDINGS: Hypoventilatory exam. The cardiomediastinal silhouette is unremarkable given projection and technique. No appreciable pleural effusion. No pneumothorax. Bilateral interstitial and patchy pulmonary opacities appear slightly improved when compared to October 31st radiograph. No acute osseous abnormality. IMPRESSION: Bilateral interstitial and patchy pulmonary opacities appear slightly improved. Electronically Signed   By: Albin Felling M.D.   On: 02/01/2021 08:35     Today   Subjective    Dana Doyle today has no headache,no chest abdominal pain,no new weakness tingling or numbness, feels much better wants to go home today.   Objective   Blood pressure (!) 107/55, pulse 66, temperature 97.6 F (36.4 C), temperature source Oral, resp. rate (!) 21, height 5' 2.5" (1.588 m), weight 92.5 kg, SpO2 92 %.   Intake/Output Summary (Last 24 hours) at 02/05/2021 1036 Last data filed at 02/04/2021 1930 Gross per 24 hour  Intake 120  ml  Output --  Net 120 ml    Exam  Awake Alert, No new F.N deficits, Normal affect [place].AT,PERRAL Supple Neck,No JVD, No cervical lymphadenopathy appriciated.  Symmetrical  Chest wall movement, Good air movement bilaterally, CTAB RRR,No Gallops,Rubs or new Murmurs, No Parasternal Heave +ve B.Sounds, Abd Soft, Non tender, No organomegaly appriciated, No rebound -guarding or rigidity. No Cyanosis, Clubbing or edema, No new Rash or bruise   Data Review   CBC w Diff:  Lab Results  Component Value Date   WBC 8.4 02/04/2021   HGB 10.9 (L) 02/04/2021   HCT 32.6 (L) 02/04/2021   PLT 251 02/04/2021   LYMPHOPCT 5 02/04/2021   MONOPCT 3 02/04/2021   EOSPCT 0 02/04/2021   BASOPCT 0 02/04/2021    CMP:  Lab Results  Component Value Date   NA 137 02/04/2021   K 4.3 02/04/2021   CL 108 02/04/2021   CO2 25 02/04/2021   BUN 23 02/04/2021   CREATININE 1.05 (H) 02/04/2021   CREATININE 0.95 10/20/2012   PROT 4.7 (L) 02/04/2021   ALBUMIN 2.6 (L) 02/04/2021   BILITOT 0.3 02/04/2021   ALKPHOS 62 02/04/2021   AST 17 02/04/2021   ALT 14 02/04/2021  .   Total Time in preparing paper work, data evaluation and todays exam - 71 minutes  Lala Lund M.D on 02/05/2021 at 10:36 AM  [facility]

## 2021-02-05 NOTE — Discharge Instructions (Signed)
Follow with Primary MD Michela Pitcher, NP and your lung doctor in 7 days   Get CBC, CMP, 2 view Chest X ray -  checked next visit within 1 week by Primary MD   Activity: As tolerated with Full fall precautions use walker/cane & assistance as needed  Disposition Home   Diet: Heart Healthy    Special Instructions: If you have smoked or chewed Tobacco  in the last 2 yrs please stop smoking, stop any regular Alcohol  and or any Recreational drug use.  On your next visit with your primary care physician please Get Medicines reviewed and adjusted.  Please request your Prim.MD to go over all Hospital Tests and Procedure/Radiological results at the follow up, please get all Hospital records sent to your Prim MD by signing hospital release before you go home.  If you experience worsening of your admission symptoms, develop shortness of breath, life threatening emergency, suicidal or homicidal thoughts you must seek medical attention immediately by calling 911 or calling your MD immediately  if symptoms less severe.  You Must read complete instructions/literature along with all the possible adverse reactions/side effects for all the Medicines you take and that have been prescribed to you. Take any new Medicines after you have completely understood and accpet all the possible adverse reactions/side effects.

## 2021-02-06 ENCOUNTER — Telehealth: Payer: Self-pay

## 2021-02-06 NOTE — Telephone Encounter (Signed)
Transition Care Management Unsuccessful Follow-up Telephone Call  Date of discharge and from where:  02/05/21 from [facility]  Attempts:  1st Attempt  Reason for unsuccessful TCM follow-up call:  Left voice message

## 2021-02-07 ENCOUNTER — Telehealth: Payer: Self-pay

## 2021-02-07 NOTE — Telephone Encounter (Signed)
Transition Care Management Follow-up Telephone Call Date of discharge and from where: 02/05/21 from [facility] How have you been since you were released from the hospital? Patient states feeling better. Any questions or concerns? No  Items Reviewed: Did the pt receive and understand the discharge instructions provided? Yes  Medications obtained and verified? Yes  Other? No  Any new allergies since your discharge? No  Dietary orders reviewed? Yes Do you have support at home? Yes   Home Care and Equipment/Supplies: Were home health services ordered? yes If so, what is the name of the agency? [facility]  Has the agency set up a time to come to the patient's home? Patient waiting on a call back for a specific time Were any new equipment or medical supplies ordered?  No What is the name of the medical supply agency? N/A Were you able to get the supplies/equipment? not applicable Do you have any questions related to the use of the equipment or supplies? No  Functional Questionnaire: (I = Independent and D = Dependent) ADLs: I  Bathing/Dressing- I  Meal Prep- I with assistance, husband helps with meals.  Eating- I  Maintaining continence- I  Transferring/Ambulation- I with assistance using a walker.  Managing Meds- I  Follow up appointments reviewed:  PCP Hospital f/u appt confirmed? Patient states she prefers to see Pulmonologist at [facility] before making an appointment with the NP [facility] f/u appt confirmed? No   Are transportation arrangements needed? No  If their condition worsens, is the pt aware to call PCP or go to the Emergency Dept.? Yes Was the patient provided with contact information for the PCP's office or ED? Yes Was to pt encouraged to call back with questions or concerns? Yes

## 2021-02-08 ENCOUNTER — Telehealth: Payer: Self-pay | Admitting: Nurse Practitioner

## 2021-02-08 NOTE — Telephone Encounter (Signed)
Home Health verbal orders Caller Name:Beth Agency Name: [facility] number: [phone]  Requesting OT/PT/Skilled nursing/Social Work/Speech:  Reason:Skilled Nursing/Respiratory management// pt also needs a prescription for valium  Frequency:1 wk for 8 wks...  Please forward to [facility] pool or providers CMA

## 2021-02-09 NOTE — Telephone Encounter (Signed)
Dana Doyle advised. Called patient and left detailed message on her voicemail-ok per DPR on file-explaining the importance of getting scheduled to repeat labs, xray and discuss medication management. Called patient's husband Dana Doyle-ok per dpr on file-left message asking him to relay this information also.

## 2021-02-09 NOTE — Telephone Encounter (Signed)
So you are aware I left a message for her to call me back. Not sure if I will get to talk to her or not.

## 2021-02-09 NOTE — Telephone Encounter (Signed)
I did review her discharge paper work. She needs a office visit to have her blood recheck and an xray. We can also discuss the medications that she is requesting

## 2021-02-09 NOTE — Telephone Encounter (Signed)
Spoke with [employer] and advised ok for verbal orders per [employer]. Patient was d/c with medication list stating to take Diazepam-patient is still taking this as needed-she is out of the medication. [facility]  Beth saw patient yesterday -patient at that point was at home for 2 days. 02 sat 02/08/21 was 92% during her visit. Patient told Beth yesterday morning when she woke up her 02 was in the 75s. Eustaquio Maize is not sure if that was a true reading, she educated patient on making sure her fingers are not cold and she is sitting up straight and taking good breaths. Beth advised patient if her levels read that low again to reach out to them.   Beth needed to report interactions that were noted with patients medications when they entered them in the system-they entered everything that is in epic as given at discharge. Plaquenil and Zofran-level 1 interaction Plaquenil and Hydroxyzine- level 2 Hydroxyzine and Potassium-level 2 Potassium and Bactrum-level 2

## 2021-02-09 NOTE — Telephone Encounter (Signed)
Hospital follow up scheduled

## 2021-02-09 NOTE — Telephone Encounter (Signed)
noted 

## 2021-02-09 NOTE — Telephone Encounter (Signed)
Beth with [facility] Lemmings returning call. Beth states its ok to leave a verbal message.

## 2021-02-13 DIAGNOSIS — R0902 Hypoxemia: Secondary | ICD-10-CM | POA: Diagnosis not present

## 2021-02-14 ENCOUNTER — Inpatient Hospital Stay: Payer: Medicare HMO | Admitting: Nurse Practitioner

## 2021-02-14 ENCOUNTER — Other Ambulatory Visit: Payer: Self-pay | Admitting: Internal Medicine

## 2021-02-15 NOTE — Telephone Encounter (Signed)
Name of Medication: pantoprazole 40 mg Name of [facility]acy: [facility] or Written Date and Quantity: # 90 x 2 on 07/05/20 by Avie Echevaria NP Last Office Visit and Type: TOC to Romilda Garret NP 11/20/2020 Next Office Visit and Type: 02/19/21 HFU

## 2021-02-16 ENCOUNTER — Telehealth: Payer: Self-pay

## 2021-02-16 ENCOUNTER — Other Ambulatory Visit: Payer: Self-pay | Admitting: Nurse Practitioner

## 2021-02-16 MED ORDER — MIRTAZAPINE 7.5 MG PO TABS: 7.5000 mg | ORAL_TABLET | Freq: Every day | ORAL | 0 refills | Status: DC

## 2021-02-16 NOTE — Telephone Encounter (Signed)
Michela Pitcher, NP  Kris Mouton, CMA I set some papers from the health agency on your desk to be faxed. She can discontinue the hydroxyzine. Not sure if she is taking it that often. Please let her know. We will discuss all the medications including the valium at her office visit

## 2021-02-16 NOTE — Telephone Encounter (Signed)
Pt daughter Jacqlyn Larsen called asking if Catalina Antigua can prescribed pt Xanax to help pt sleep and calm her down. Pt daughter states that pt has Lupus and a lung disease called IDL and when she gets upset it makes it hard for her to breathe.

## 2021-02-16 NOTE — Telephone Encounter (Signed)
Spoke with patient and her son, Darnelle Maffucci and daughter Wells Guiles "becky" Gerringer also. Advised of provider's comments. Patient states she does not take Hydroxyzine anymore, she tried that for sleep and had terrible nightmares. I added this to her allergy/intolerance list. Darnelle Maffucci stated that patient has a lot of medical issues and is not able to just try a lot of the medications unfortunately. Discussed patient's upcoming appointment on 02/19/21 and patient stated she was not going to make it due to passing of her spouse recently. Darnelle Maffucci intervene and stated she is coming and that she needs to be checked out by the provider. I also expressed the importance of patient been seen to make sure we review all the medications and update everything correctly and make sure we take care of her. Darnelle Maffucci said patient will be here and that him and his sister are there to help patient. Patient has not been taking Diazepam either, states has not had that in a while, patient started to cry, feels overwhelmed and is not feeling well. Patient asked Catalina Antigua to please and send in something stronger to get her through the weekend at least for her nerves and to help her sleep. Rebecca-daughter stated patient needed something stronger she felt than Diazepam. They appreciated me listening to them and been sympathetic. They asked for [facility] to please consider helping patient. Advised I would let Matt know and we would be in touch later with the game plan.

## 2021-02-16 NOTE — Telephone Encounter (Signed)
Called and spoke with daughter (DPR) and discussed that I am not comfortable giving her a benzo because she has lung disease and was just released from the hospital in regards to acute respiratory failure. The patient lost her husband unexpectedly and is having trouble sleeping and getting worked up at times. Did discuss the importance of seeing her in office and will send in a low dose mirtazapine to help with sleep. Daughter acknowledged She was scheduled to see me Monday in office

## 2021-02-19 ENCOUNTER — Ambulatory Visit (INDEPENDENT_AMBULATORY_CARE_PROVIDER_SITE_OTHER): Payer: Medicare HMO | Admitting: Nurse Practitioner

## 2021-02-19 ENCOUNTER — Ambulatory Visit (INDEPENDENT_AMBULATORY_CARE_PROVIDER_SITE_OTHER)
Admission: RE | Admit: 2021-02-19 | Discharge: 2021-02-19 | Disposition: A | Discharge status: Home / Self Care | Payer: Medicare HMO | Source: Ambulatory Visit | Attending: Nurse Practitioner | Admitting: Nurse Practitioner

## 2021-02-19 ENCOUNTER — Telehealth: Payer: Self-pay | Admitting: Nurse Practitioner

## 2021-02-19 ENCOUNTER — Other Ambulatory Visit: Payer: Self-pay | Admitting: Nurse Practitioner

## 2021-02-19 ENCOUNTER — Other Ambulatory Visit: Payer: Self-pay

## 2021-02-19 VITALS — BP 106/60 | HR 81 | Temp 97.7°F | Resp 18 | Ht 62.5 in | Wt 180.4 lb

## 2021-02-19 DIAGNOSIS — U071 Acute respiratory failure, unspecified whether with hypoxia or hypercapnia: Secondary | ICD-10-CM

## 2021-02-19 DIAGNOSIS — F419 Anxiety disorder, unspecified: Secondary | ICD-10-CM

## 2021-02-19 DIAGNOSIS — J9610 Chronic respiratory failure, unspecified whether with hypoxia or hypercapnia: Secondary | ICD-10-CM

## 2021-02-19 DIAGNOSIS — B370 Candidal stomatitis: Secondary | ICD-10-CM

## 2021-02-19 DIAGNOSIS — R110 Nausea: Secondary | ICD-10-CM | POA: Diagnosis not present

## 2021-02-19 DIAGNOSIS — R0602 Shortness of breath: Secondary | ICD-10-CM | POA: Insufficient documentation

## 2021-02-19 DIAGNOSIS — J9600 Acute respiratory failure, unspecified whether with hypoxia or hypercapnia: Secondary | ICD-10-CM

## 2021-02-19 DIAGNOSIS — M329 Systemic lupus erythematosus, unspecified: Secondary | ICD-10-CM | POA: Diagnosis not present

## 2021-02-19 DIAGNOSIS — H60391 Other infective otitis externa, right ear: Secondary | ICD-10-CM

## 2021-02-19 DIAGNOSIS — H6090 Unspecified otitis externa, unspecified ear: Secondary | ICD-10-CM | POA: Insufficient documentation

## 2021-02-19 DIAGNOSIS — Z634 Disappearance and death of family member: Secondary | ICD-10-CM | POA: Insufficient documentation

## 2021-02-19 DIAGNOSIS — R0609 Other forms of dyspnea: Secondary | ICD-10-CM | POA: Insufficient documentation

## 2021-02-19 MED ORDER — OFLOXACIN 0.3 % OT SOLN: 10.0000 [drp] | Freq: Every day | OTIC | 0 refills | Status: AC

## 2021-02-19 MED ORDER — ALBUTEROL SULFATE HFA 108 (90 BASE) MCG/ACT IN AERS: 1.0000 | INHALATION_SPRAY | Freq: Four times a day (QID) | RESPIRATORY_TRACT | 1 refills | Status: AC | PRN

## 2021-02-19 MED ORDER — BUSPIRONE HCL 5 MG PO TABS: 5.0000 mg | ORAL_TABLET | Freq: Two times a day (BID) | ORAL | 1 refills | Status: DC

## 2021-02-19 MED ORDER — NYSTATIN 100000 UNIT/ML MT SUSP: 5.0000 mL | Freq: Four times a day (QID) | OROMUCOSAL | 0 refills | Status: DC

## 2021-02-19 MED ORDER — ONDANSETRON HCL 4 MG PO TABS: 4.0000 mg | ORAL_TABLET | Freq: Three times a day (TID) | ORAL | 0 refills | Status: AC | PRN

## 2021-02-19 MED ORDER — NYSTATIN 100000 UNIT/ML MT SUSP: 5.0000 mL | Freq: Four times a day (QID) | OROMUCOSAL | 0 refills | Status: AC

## 2021-02-19 NOTE — Assessment & Plan Note (Signed)
Patient presents for hospital follow-up where she was admitted for acute respiratory failure.  She was discharged on a large dose of prednisone and then taper down to her normal dosing now.  Patient is requiring oxygen all the time as were before she was needed on occasions.  Wearing 3 L in office today and anywhere from 3 to 3-1/2 L at home.  States that she does have an appointment with her pulmonologist in December and her pulmonologist is now aware of everything that has transpired.  Continue following up with pulmonology as recommended.

## 2021-02-19 NOTE — Progress Notes (Signed)
Established Patient Office Visit  Subjective:  Patient ID: Dana Doyle, female    DOB: May 14, 1950  Age: 70 y.o. MRN: [id number]  CC:  Chief Complaint  Patient presents with   Hospitalization Follow-up   Sinus Problem    Has had the past few days, sinus pressure/head pressue, blwoing out yellow mucus, right ear has been having yellowish drainage coming out of it at times. Started a few days ago. No fever.    HPI Dana Doyle presents for hosptialization  Was admitted on 01/29/2021 thru 02/05/2021  Acute resp faiure. States that she was able to wean off oxygen before being admitted to the hospital and since discharge is requiring oxygen all the time approx 3-3.5 liters.     States that she has been having Thick yellow stuff coming from the nose and some sinus pressure/tenderness for about 2-3 days. States she had drainage come out of her left year that was more clear. States when she got out of the shower it was more yellow green color. Ear was hurting but not currently and feels like she cannot hear out of it  On prednisone 5mg  at least daily  Past Medical History:  Diagnosis Date   Anxiety    Arthritis    Diabetes mellitus without complication (HCC)    GERD (gastroesophageal reflux disease)    Giardia    Hyperlipidemia    Hypertension    IBS (irritable bowel syndrome)    Interstitial lung disease (HCC)    Lupus (HCC)    MVP (mitral valve prolapse)    Obesity    Thyroid disease     Past Surgical History:  Procedure Laterality Date   ABDOMINAL HYSTERECTOMY     total   CHOLECYSTECTOMY     CYSTECTOMY Right    hip   REPLACEMENT TOTAL KNEE BILATERAL     REVISION TOTAL KNEE ARTHROPLASTY Right    TONSILLECTOMY AND ADENOIDECTOMY      Family History  Adopted: Yes  Problem Relation Age of Onset   Cancer Brother        prostates cancer, lung cancer,    Social History   Socioeconomic History   Marital status: Married    Spouse name: Not on file    Number of children: 3   Years of education: Not on file   Highest education level: Not on file  Occupational History   Not on file  Tobacco Use   Smoking status: Never   Smokeless tobacco: Never  Substance and Sexual Activity   Alcohol use: No   Drug use: No   Sexual activity: Never    Birth control/protection: Surgical  Other Topics Concern   Not on file  Social History Narrative   Daily caffeine    Social Determinants of Health   Financial Resource Strain: Low Risk    Difficulty of Paying Living Expenses: Not very hard  Food Insecurity: Not on file  Transportation Needs: Not on file  Physical Activity: Not on file  Stress: Not on file  Social Connections: Not on file  Intimate Partner Violence: Not on file    Outpatient Medications Prior to Visit  Medication Sig Dispense Refill   acetaminophen (TYLENOL) 500 MG tablet Take 500 mg by mouth every 6 (six) hours as needed for moderate pain or headache.     albuterol (VENTOLIN HFA) 108 (90 Base) MCG/ACT inhaler Inhale 1-2 puffs into the lungs every 6 (six) hours as needed for wheezing or shortness of breath.  budesonide-formoterol (SYMBICORT) 160-4.5 MCG/ACT inhaler Inhale 2 puffs into the lungs in the morning and at bedtime.     Cholecalciferol (VITAMIN D3) 250 MCG (10000 UT) capsule Take 10,000 Units by mouth daily.     furosemide (LASIX) 20 MG tablet TAKE 1 TABLET BY MOUTH TWICE A DAY (Patient taking differently: Take 20 mg by mouth 2 (two) times daily.) 180 tablet 1   hydroxychloroquine (PLAQUENIL) 200 MG tablet Take 400 mg by mouth daily.     KRILL OIL PO Take by mouth.     levothyroxine (SYNTHROID) 50 MCG tablet TAKE 1 TABLET BY MOUTH EVERY DAY BEFORE BREAKFAST (Patient taking differently: Take 50 mcg by mouth daily before breakfast.) 90 tablet 3   metoprolol succinate (TOPROL-XL) 25 MG 24 hr tablet TAKE 1 TABLET (25 MG TOTAL) BY MOUTH DAILY. 90 tablet 1   mirtazapine (REMERON) 7.5 MG tablet Take 1 tablet (7.5 mg total)  by mouth at bedtime. 15 tablet 0   Multiple Vitamins-Minerals (WOMENS MULTIVITAMIN PO) Take 1 tablet by mouth daily.     OXYGEN Inhale 2.5-3 L into the lungs as needed.     pantoprazole (PROTONIX) 40 MG tablet TAKE 1 TABLET BY MOUTH EVERY DAY 90 tablet 0   Potassium 99 MG TABS Take 99 mg by mouth daily.     predniSONE (DELTASONE) 1 MG tablet Take 3 mg by mouth daily with breakfast.     predniSONE (DELTASONE) 5 MG tablet take 8 tablets daily for 3 days, 6 tabs daily for 3 days, 4 tabs daily for 3 days, 2 tabs daily for 3 days, 1 tab daily for 3 days, then continue your home dose as before. 65 tablet 0   riTUXimab (RITUXAN IV) Inject into the vein every 6 (six) months.     sertraline (ZOLOFT) 100 MG tablet TAKE 1 TABLET BY MOUTH EVERY DAY (Patient taking differently: Take 100 mg by mouth daily.) 90 tablet 1   sulfamethoxazole-trimethoprim (BACTRIM DS) 800-160 MG tablet Take 1 tablet by mouth every Monday, Wednesday, and Friday.     predniSONE (DELTASONE) 2.5 MG tablet Take 1 tablet (2.5 mg total) by mouth daily. Resume your home dose once you have finished the given steroid taper.     NIFEdipine (ADALAT CC) 30 MG 24 hr tablet Take 30 mg by mouth daily.     ondansetron (ZOFRAN) 4 MG tablet Take 4 mg by mouth every 8 (eight) hours as needed for nausea or vomiting. (Patient not taking: Reported on 02/19/2021)     No facility-administered medications prior to visit.    Allergies  Allergen Reactions   Belimumab Other (See Comments) and Nausea And Vomiting   Mycophenolate Mofetil Other (See Comments) and Nausea And Vomiting   Phenergan [Promethazine Hcl] Anaphylaxis   Codeine Hives, Itching and Other (See Comments)   Meloxicam Hives   Statins Other (See Comments)    Muscle spasm and cramps   Ezetimibe    Hydroxyzine Other (See Comments)    Severe nightmares   Tape     Other reaction(s): Other (See Comments) Very thin skin due to chronic steroid use     ROS Review of Systems   Constitutional:  Negative for chills and fever.  HENT:  Positive for congestion, ear pain, postnasal drip and sinus pressure. Negative for rhinorrhea and sore throat.   Respiratory:  Positive for cough and shortness of breath.   Cardiovascular:  Negative for chest pain.  Gastrointestinal:  Negative for blood in stool, constipation, diarrhea, nausea and vomiting.  Neurological:  Negative for headaches.     Objective:    Physical Exam HENT:     Right Ear: Decreased hearing noted. Drainage present. No tenderness. There is no impacted cerumen.     Left Ear: Tympanic membrane, ear canal and external ear normal. There is no impacted cerumen.     Nose:     Right Sinus: Maxillary sinus tenderness present. No frontal sinus tenderness.     Left Sinus: Maxillary sinus tenderness present. No frontal sinus tenderness.     Mouth/Throat:     Comments: White coated spots on tongue, oropharynx, and buccal area Eyes:     Extraocular Movements: Extraocular movements intact.  Cardiovascular:     Rate and Rhythm: Normal rate and regular rhythm.  Pulmonary:     Effort: Pulmonary effort is normal.     Comments: Decreased breath sounds in the bilateral lower lobes Abdominal:     General: Bowel sounds are normal.  Psychiatric:        Mood and Affect: Affect is tearful.        Behavior: Behavior is cooperative.        Thought Content: Thought content normal. Thought content does not include homicidal or suicidal ideation. Thought content does not include homicidal or suicidal plan.        Cognition and Memory: Cognition normal.        Judgment: Judgment normal.     Comments: Patient lost husband unexpectedly almost 1 week ago    BP 106/60   Pulse 81   Temp 97.7 F (36.5 C)   Resp 18   Ht 5' 2.5" (1.588 m)   Wt 180 lb 6 oz (81.8 kg)   SpO2 94% Comment: on 3 liters of O2  BMI 32.47 kg/m  Wt Readings from Last 3 Encounters:  02/19/21 180 lb 6 oz (81.8 kg)  01/30/21 204 lb (92.5 kg)  11/20/20  203 lb 8 oz (92.3 kg)     Health Maintenance Due  Topic Date Due   FOOT EXAM  Never done   OPHTHALMOLOGY EXAM  Never done   URINE MICROALBUMIN  Never done   Zoster Vaccines- Shingrix (1 of 2) Never done   MAMMOGRAM  10/29/2014   DEXA SCAN  Never done   COVID-19 Vaccine (3 - Pfizer risk series) 01/06/2020   INFLUENZA VACCINE  10/30/2020   Pneumonia Vaccine 51+ Years old (4 - PPSV23 if available, else PCV20) 04/25/2021    There are no preventive care reminders to display for this patient.  Lab Results  Component Value Date   TSH 2.49 11/20/2020   Lab Results  Component Value Date   WBC 8.4 02/04/2021   HGB 10.9 (L) 02/04/2021   HCT 32.6 (L) 02/04/2021   MCV 86.0 02/04/2021   PLT 251 02/04/2021   Lab Results  Component Value Date   NA 137 02/04/2021   K 4.3 02/04/2021   CO2 25 02/04/2021   GLUCOSE 190 (H) 02/04/2021   BUN 23 02/04/2021   CREATININE 1.05 (H) 02/04/2021   BILITOT 0.3 02/04/2021   ALKPHOS 62 02/04/2021   AST 17 02/04/2021   ALT 14 02/04/2021   PROT 4.7 (L) 02/04/2021   ALBUMIN 2.6 (L) 02/04/2021   CALCIUM 8.1 (L) 02/04/2021   ANIONGAP 4 (L) 02/04/2021   GFR 53.90 (L) 11/20/2020   Lab Results  Component Value Date   CHOL 294 (H) 11/20/2020   Lab Results  Component Value Date   HDL 66.50 11/20/2020   Lab  Results  Component Value Date   LDLCALC 199 (H) 11/20/2020   Lab Results  Component Value Date   TRIG 143.0 11/20/2020   Lab Results  Component Value Date   CHOLHDL 4 11/20/2020   Lab Results  Component Value Date   HGBA1C 5.5 11/20/2020      Assessment & Plan:   Problem List Items Addressed This Visit       Respiratory   Chronic respiratory failure ([place])    Due to interstitial lung disease is followed by pulmonology.  Was wearing oxygen on a as needed basis now requiring it all the time for now.  Follow-up with pulmonology as recommended      Relevant Medications   albuterol (VENTOLIN HFA) 108 (90 Base) MCG/ACT inhaler    Other Relevant Orders   DG Chest 2 View   Acute respiratory failure due to COVID-19 [facility])    Patient presents for hospital follow-up where she was admitted for acute respiratory failure.  She was discharged on a large dose of prednisone and then taper down to her normal dosing now.  Patient is requiring oxygen all the time as were before she was needed on occasions.  Wearing 3 L in office today and anywhere from 3 to 3-1/2 L at home.  States that she does have an appointment with her pulmonologist in December and her pulmonologist is now aware of everything that has transpired.  Continue following up with pulmonology as recommended.      Relevant Medications   nystatin (MYCOSTATIN) 100000 UNIT/ML suspension     Digestive   Thrush - Primary    Thrush on buccal surfaces hard palate and oropharynx.  Patient just finished large doses of steroids on steroids daily along with ICS inhalers.  We will treat with nystatin 4 times a day for a week.  Continue to monitor      Relevant Medications   nystatin (MYCOSTATIN) 100000 UNIT/ML suspension     Nervous and Auditory   Otitis externa    Right ear otitis externa with some hearing deficit.  We will treat if does not improve patient will need to see ENT discussed this with patient if she does improve in 2 weeks to let me know just finished high-dose of steroids from hospital.  Do not think it would be beneficial right now for hearing      Relevant Medications   ofloxacin (FLOXIN) 0.3 % OTIC solution     Musculoskeletal and Integument   SLE (systemic lupus erythematosus related syndrome) (HCC)     Other   Anxiety    History of anxiety when she was on diazepam in the past.  Did have a detailed discussion is why I did not feel comfortable writing patient is diazepam with recent hospital admission of acute respiratory failure and patient's requiring oxygen all the time now when she was not in the past.  Patient acknowledged understood we tried  hydroxyzine in the past and gave patient nightmares.  Before office visit they were requesting something and went with mirtazapine 7.5 at bedtime.  Patient did not take medication states she does not want to have something all the time that takes a long time to work had a discussion with patient and daughter that we will discontinue mirtazapine and try BuSpar.  Continue to monitor Start BuSpar 5 mg twice daily      Relevant Medications   busPIRone (BUSPAR) 5 MG tablet   Shortness of breath    Patient and interstitial lung  disease and lupus followed by appropriate specialists.  Currently requiring about 3 L of oxygen in office continue wearing oxygen as directed by specialist and follow-up as recommended.      Relevant Medications   albuterol (VENTOLIN HFA) 108 (90 Base) MCG/ACT inhaler   Other Relevant Orders   CBC   Comprehensive metabolic panel   Brain natriuretic peptide   Other Visit Diagnoses     Nausea       Relevant Medications   ondansetron (ZOFRAN) 4 MG tablet       No orders of the defined types were placed in this encounter.   Follow-up: Return in about 2 months (around 04/21/2021).   This visit occurred during the SARS-CoV-2 public health emergency.  Safety protocols were in place, including screening questions prior to the visit, additional usage of staff PPE, and extensive cleaning of exam room while observing appropriate contact time as indicated for disinfecting solutions.   Romilda Garret, NP

## 2021-02-19 NOTE — Assessment & Plan Note (Signed)
History of anxiety when she was on diazepam in the past.  Did have a detailed discussion is why I did not feel comfortable writing patient is diazepam with recent hospital admission of acute respiratory failure and patient's requiring oxygen all the time now when she was not in the past.  Patient acknowledged understood we tried hydroxyzine in the past and gave patient nightmares.  Before office visit they were requesting something and went with mirtazapine 7.5 at bedtime.  Patient did not take medication states she does not want to have something all the time that takes a long time to work had a discussion with patient and daughter that we will discontinue mirtazapine and try BuSpar.  Continue to monitor Start BuSpar 5 mg twice daily

## 2021-02-19 NOTE — Assessment & Plan Note (Signed)
Patient and interstitial lung disease and lupus followed by appropriate specialists.  Currently requiring about 3 L of oxygen in office continue wearing oxygen as directed by specialist and follow-up as recommended.

## 2021-02-19 NOTE — Assessment & Plan Note (Signed)
Right ear otitis externa with some hearing deficit.  We will treat if does not improve patient will need to see ENT discussed this with patient if she does improve in 2 weeks to let me know just finished high-dose of steroids from hospital.  Do not think it would be beneficial right now for hearing

## 2021-02-19 NOTE — Assessment & Plan Note (Signed)
Due to interstitial lung disease is followed by pulmonology.  Was wearing oxygen on a as needed basis now requiring it all the time for now.  Follow-up with pulmonology as recommended

## 2021-02-19 NOTE — Patient Instructions (Addendum)
Nice to see you today DO NOT take the Mitazipine (remeron). I sent in buspar instead. Continue taking other medications as prescribed.  Follow up with lung doctor as scheduled I sent in 2 new rxs. An ear drop and medication for thrush

## 2021-02-19 NOTE — Assessment & Plan Note (Signed)
Thrush on buccal surfaces hard palate and oropharynx.  Patient just finished large doses of steroids on steroids daily along with ICS inhalers.  We will treat with nystatin 4 times a day for a week.  Continue to monitor

## 2021-02-19 NOTE — Telephone Encounter (Signed)
Beth from [facility] could not reach pt for a visit.They tried multiple attempts to reach the pt last week and today also and there was no response. They also could not leave a message because the voicemail was full.  Phone for number for husband is incorrect

## 2021-02-20 DIAGNOSIS — J90 Pleural effusion, not elsewhere classified: Secondary | ICD-10-CM | POA: Diagnosis not present

## 2021-02-20 DIAGNOSIS — R0602 Shortness of breath: Secondary | ICD-10-CM | POA: Diagnosis not present

## 2021-02-20 LAB — COMPREHENSIVE METABOLIC PANEL
ALT: 16 U/L (ref 0–35)
AST: 19 U/L (ref 0–37)
Albumin: 3.9 g/dL (ref 3.5–5.2)
Alkaline Phosphatase: 81 U/L (ref 39–117)
BUN: 11 mg/dL (ref 6–23)
CO2: 28 mEq/L (ref 19–32)
Calcium: 8.9 mg/dL (ref 8.4–10.5)
Chloride: 94 mEq/L — ABNORMAL LOW (ref 96–112)
Creatinine, Ser: 1.1 mg/dL (ref 0.40–1.20)
GFR: 51.46 mL/min — ABNORMAL LOW (ref >60.00)
Glucose, Bld: 107 mg/dL — ABNORMAL HIGH (ref 70–99)
Potassium: 3.4 mEq/L — ABNORMAL LOW (ref 3.5–5.1)
Sodium: 135 mEq/L (ref 135–145)
Total Bilirubin: 0.7 mg/dL (ref 0.2–1.2)
Total Protein: 6.5 g/dL (ref 6.0–8.3)

## 2021-02-20 LAB — CBC
HCT: 38.4 % (ref 36.0–46.0)
Hemoglobin: 12.6 g/dL (ref 12.0–15.0)
MCHC: 32.9 g/dL (ref 30.0–36.0)
MCV: 84.9 fl (ref 78.0–100.0)
Platelets: 190 10*3/uL (ref 150.0–400.0)
RBC: 4.52 Mil/uL (ref 3.87–5.11)
RDW: 15.2 % (ref 11.5–15.5)
WBC: 13 10*3/uL — ABNORMAL HIGH (ref 4.0–10.5)

## 2021-02-20 LAB — BRAIN NATRIURETIC PEPTIDE: Pro B Natriuretic peptide (BNP): 37 pg/mL (ref 0.0–100.0)

## 2021-02-20 NOTE — Telephone Encounter (Signed)
Left detailed message for Beth with updated phone numbers for the patient and her daughter and advising her that we saw patient for follow up yesterday.

## 2021-02-20 NOTE — Telephone Encounter (Signed)
If we get in contact with Eustaquio Maize we can let her know that I saw the patient on Monday 02/19/2021 and that her husband recently passed and that number may no longer be in service.  Thanks, [facility]

## 2021-02-26 ENCOUNTER — Telehealth: Payer: Self-pay | Admitting: Nurse Practitioner

## 2021-02-26 ENCOUNTER — Other Ambulatory Visit: Payer: Self-pay | Admitting: Nurse Practitioner

## 2021-02-26 DIAGNOSIS — H60391 Other infective otitis externa, right ear: Secondary | ICD-10-CM

## 2021-02-26 DIAGNOSIS — Z7951 Long term (current) use of inhaled steroids: Secondary | ICD-10-CM

## 2021-02-26 DIAGNOSIS — K219 Gastro-esophageal reflux disease without esophagitis: Secondary | ICD-10-CM

## 2021-02-26 DIAGNOSIS — J1282 Pneumonia due to coronavirus disease 2019: Secondary | ICD-10-CM | POA: Diagnosis not present

## 2021-02-26 DIAGNOSIS — Z9981 Dependence on supplemental oxygen: Secondary | ICD-10-CM

## 2021-02-26 DIAGNOSIS — Z9181 History of falling: Secondary | ICD-10-CM

## 2021-02-26 DIAGNOSIS — F419 Anxiety disorder, unspecified: Secondary | ICD-10-CM | POA: Diagnosis not present

## 2021-02-26 DIAGNOSIS — I700 Atherosclerosis of aorta: Secondary | ICD-10-CM | POA: Diagnosis not present

## 2021-02-26 DIAGNOSIS — J9811 Atelectasis: Secondary | ICD-10-CM

## 2021-02-26 DIAGNOSIS — I1310 Hypertensive heart and chronic kidney disease without heart failure, with stage 1 through stage 4 chronic kidney disease, or unspecified chronic kidney disease: Secondary | ICD-10-CM | POA: Diagnosis not present

## 2021-02-26 DIAGNOSIS — U071 COVID-19: Secondary | ICD-10-CM | POA: Diagnosis not present

## 2021-02-26 DIAGNOSIS — N182 Chronic kidney disease, stage 2 (mild): Secondary | ICD-10-CM | POA: Diagnosis not present

## 2021-02-26 DIAGNOSIS — J9621 Acute and chronic respiratory failure with hypoxia: Secondary | ICD-10-CM | POA: Diagnosis not present

## 2021-02-26 DIAGNOSIS — Z7952 Long term (current) use of systemic steroids: Secondary | ICD-10-CM

## 2021-02-26 DIAGNOSIS — Z792 Long term (current) use of antibiotics: Secondary | ICD-10-CM

## 2021-02-26 DIAGNOSIS — E669 Obesity, unspecified: Secondary | ICD-10-CM

## 2021-02-26 DIAGNOSIS — Z96653 Presence of artificial knee joint, bilateral: Secondary | ICD-10-CM

## 2021-02-26 DIAGNOSIS — Z6833 Body mass index (BMI) 33.0-33.9, adult: Secondary | ICD-10-CM

## 2021-02-26 DIAGNOSIS — E034 Atrophy of thyroid (acquired): Secondary | ICD-10-CM

## 2021-02-26 DIAGNOSIS — E1122 Type 2 diabetes mellitus with diabetic chronic kidney disease: Secondary | ICD-10-CM | POA: Diagnosis not present

## 2021-02-26 DIAGNOSIS — M3213 Lung involvement in systemic lupus erythematosus: Secondary | ICD-10-CM | POA: Diagnosis not present

## 2021-02-26 DIAGNOSIS — J019 Acute sinusitis, unspecified: Secondary | ICD-10-CM

## 2021-02-26 DIAGNOSIS — E7800 Pure hypercholesterolemia, unspecified: Secondary | ICD-10-CM

## 2021-02-26 MED ORDER — AMOXICILLIN-POT CLAVULANATE 875-125 MG PO TABS: 1.0000 | ORAL_TABLET | Freq: Two times a day (BID) | ORAL | 0 refills | Status: AC

## 2021-02-26 NOTE — Telephone Encounter (Signed)
Pt daughter Jacqlyn Larsen called in stated pt would a referral for ENT Dr. Marella Bile at [facility] . Would like a call back to discuss #[phone]

## 2021-02-26 NOTE — Telephone Encounter (Signed)
Spoke with Dana Doyle, patient is still having a lot of sinus congestion, stuffy nose and runny nose-sx never improved but kept getting worse as of 02/22/21 -blowing out yellow mucus, headache, sinus pressure. post nasal drip. No fever. Has taking OTC sinus medication and Mucinnex. Patient states she feels like the hearing issue is separate thing from sinuses -has used the drops and ear is still draining-would still like referral to ENT for that part to Dr Pryor Ochoa. Patient does state that Zpak does not help her at least in the past in case provider wants to send something in.

## 2021-02-26 NOTE — Telephone Encounter (Signed)
error 

## 2021-02-26 NOTE — Telephone Encounter (Signed)
Since she is having symptoms still I will send in some Augmentin for the sinus infection. Also placed the ENT referral for Dr. Pryor Ochoa in [place] for ENT

## 2021-02-27 NOTE — Telephone Encounter (Signed)
Patient advised.

## 2021-03-01 DIAGNOSIS — J849 Interstitial pulmonary disease, unspecified: Secondary | ICD-10-CM | POA: Diagnosis not present

## 2021-03-01 DIAGNOSIS — K219 Gastro-esophageal reflux disease without esophagitis: Secondary | ICD-10-CM | POA: Diagnosis not present

## 2021-03-01 DIAGNOSIS — M329 Systemic lupus erythematosus, unspecified: Secondary | ICD-10-CM | POA: Diagnosis not present

## 2021-03-01 DIAGNOSIS — Z7952 Long term (current) use of systemic steroids: Secondary | ICD-10-CM | POA: Diagnosis not present

## 2021-03-01 DIAGNOSIS — R0609 Other forms of dyspnea: Secondary | ICD-10-CM | POA: Diagnosis not present

## 2021-03-01 DIAGNOSIS — Z79899 Other long term (current) drug therapy: Secondary | ICD-10-CM | POA: Diagnosis not present

## 2021-03-05 ENCOUNTER — Telehealth: Payer: Self-pay | Admitting: Nurse Practitioner

## 2021-03-05 NOTE — Telephone Encounter (Signed)
Thought we already did the PT and OT orders. Can we find out more information please

## 2021-03-05 NOTE — Telephone Encounter (Signed)
Spoke with [place] with [facility], they did not receive approval orders for PT and OT that were verbally given to [facility] on 02/09/21. Nothing was started for the patient at that time. Per Romilda Garret ok to approve these orders now.

## 2021-03-05 NOTE — Telephone Encounter (Signed)
Dana Doyle with [facility] called stating that pt is asking for verbal orders for PT and OT. Please advise.

## 2021-03-08 ENCOUNTER — Telehealth: Payer: Self-pay | Admitting: Nurse Practitioner

## 2021-03-08 NOTE — Telephone Encounter (Signed)
Pt called stating that she just finished taking antibiotics. Pt is asking if she can get something called in for a yeast infection at [facility] in [place].

## 2021-03-08 NOTE — Telephone Encounter (Signed)
Called patient to ask what symptoms she is having and when did they start. Voicemail is full and I could not leave a message

## 2021-03-09 NOTE — Telephone Encounter (Signed)
Spoke with patient, patient is having vaginal itching and discharge that started on 12/6 or 03/07/21, patient finished the antibiotic late last week. Patient states she has a hx of getting yeast infection from abx.

## 2021-03-10 ENCOUNTER — Other Ambulatory Visit: Payer: Self-pay | Admitting: Nurse Practitioner

## 2021-03-10 DIAGNOSIS — T3695XA Adverse effect of unspecified systemic antibiotic, initial encounter: Secondary | ICD-10-CM

## 2021-03-10 DIAGNOSIS — B379 Candidiasis, unspecified: Secondary | ICD-10-CM

## 2021-03-10 MED ORDER — FLUCONAZOLE 150 MG PO TABS: 150.0000 mg | ORAL_TABLET | Freq: Once | ORAL | 0 refills | Status: AC

## 2021-03-10 NOTE — Telephone Encounter (Signed)
Medication sent to pharmacy on file.  

## 2021-03-12 NOTE — Telephone Encounter (Signed)
Patient advised.

## 2021-03-15 DIAGNOSIS — R0902 Hypoxemia: Secondary | ICD-10-CM | POA: Diagnosis not present

## 2021-03-15 NOTE — H&P (View-Only) (Signed)
Cardiology Office Note   Date:  03/19/2021   ID:  Dana Doyle, DOB [birth year], MRN [id number]  PCP:  Dana Pitcher, NP  Cardiologist:   Dana Beed Martinique, MD   Chief Complaint  Patient presents with   Shortness of Breath      History of Present Illness: Dana Doyle is a 70 y.o. female who is seen at the request of Dana Ito NP for evaluation of hypercholesterolemia. She has a history of HTN, HLD, DM, hypothyroidism, Lupus, and obesity. Carries diagnosis of MV prolapse. She was seen remotely by me in 2014. Reported history of  an echocardiogram and stress test 10 years prior. She reports she had a heart catheterization 30 years ago which demonstrated no significant coronary disease. Myoview in 2014 was low risk with mild anterior attenuation and normal EF. Echo at that time was normal with no evidence of MV prolapse.   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 On chronic steroids and oxygen. Followed by pulmonary at [facility].  She was admitted in November with acute on chronic respiratory failure due to Covid 19 PNA. CT at that time showed diffuse pulmonary infiltrates. No PE. No significant coronary calcification. Minimal aortic calcification.  She has persistent SOB since then with minimal exertion. She had Echo at [facility] that was normal. Referred for cardiac evaluation. She denies any chest pain or jaw pain. No swelling. No cough.   Recently lost her husband who passed away after many years of severe heart disease.    Past Medical History:  Diagnosis Date   Anxiety    Arthritis    Diabetes mellitus without complication (HCC)    GERD (gastroesophageal reflux disease)    Giardia    Hyperlipidemia    Hypertension    IBS (irritable bowel syndrome)    Interstitial lung disease (HCC)    Lupus (HCC)    MVP (mitral valve prolapse)    Obesity    Thyroid disease     Past Surgical History:  Procedure Laterality Date   ABDOMINAL HYSTERECTOMY     total    CHOLECYSTECTOMY     CYSTECTOMY Right    hip   REPLACEMENT TOTAL KNEE BILATERAL     REVISION TOTAL KNEE ARTHROPLASTY Right    TONSILLECTOMY AND ADENOIDECTOMY       Current Outpatient Medications  Medication Sig Dispense Refill   albuterol (VENTOLIN HFA) 108 (90 Base) MCG/ACT inhaler Inhale 1-2 puffs into the lungs every 6 (six) hours as needed for wheezing or shortness of breath. 8 g 1   Cholecalciferol (VITAMIN D3) 250 MCG (10000 UT) capsule Take 10,000 Units by mouth daily.     furosemide (LASIX) 20 MG tablet TAKE 1 TABLET BY MOUTH TWICE A DAY (Patient taking differently: Take 20 mg by mouth 2 (two) times daily.) 180 tablet 1   hydroxychloroquine (PLAQUENIL) 200 MG tablet Take 400 mg by mouth daily.     KRILL OIL PO Take by mouth daily in the afternoon.     levothyroxine (SYNTHROID) 50 MCG tablet TAKE 1 TABLET BY MOUTH EVERY DAY BEFORE BREAKFAST (Patient taking differently: Take 50 mcg by mouth daily before breakfast.) 90 tablet 3   metoprolol succinate (TOPROL-XL) 25 MG 24 hr tablet TAKE 1 TABLET (25 MG TOTAL) BY MOUTH DAILY. 90 tablet 1   NIFEdipine (ADALAT CC) 30 MG 24 hr tablet Take 30 mg by mouth daily.     OXYGEN Inhale 2.5-3 L into the lungs as needed.  pantoprazole (PROTONIX) 40 MG tablet TAKE 1 TABLET BY MOUTH EVERY DAY 90 tablet 0   Potassium 99 MG TABS Take 99 mg by mouth daily.     predniSONE (DELTASONE) 10 MG tablet Take 10 mg by mouth daily.     riTUXimab (RITUXAN IV) Inject into the vein every 6 (six) months.     sertraline (ZOLOFT) 100 MG tablet TAKE 1 TABLET BY MOUTH EVERY DAY (Patient taking differently: Take 100 mg by mouth daily.) 90 tablet 1   sulfamethoxazole-trimethoprim (BACTRIM DS) 800-160 MG tablet Take 1 tablet by mouth every Monday, Wednesday, and Friday.     acetaminophen (TYLENOL) 500 MG tablet Take 500 mg by mouth every 6 (six) hours as needed for moderate pain or headache. (Patient not taking: Reported on 03/19/2021)     budesonide-formoterol (SYMBICORT)  160-4.5 MCG/ACT inhaler Inhale 2 puffs into the lungs in the morning and at bedtime.     busPIRone (BUSPAR) 5 MG tablet Take 1 tablet (5 mg total) by mouth 2 (two) times daily. (Patient not taking: Reported on 03/19/2021) 60 tablet 1   Multiple Vitamins-Minerals (WOMENS MULTIVITAMIN PO) Take 1 tablet by mouth daily. (Patient not taking: Reported on 03/19/2021)     ondansetron (ZOFRAN) 4 MG tablet Take 1 tablet (4 mg total) by mouth every 8 (eight) hours as needed for nausea or vomiting. (Patient not taking: Reported on 03/19/2021) 20 tablet 0   predniSONE (DELTASONE) 1 MG tablet Take 3 mg by mouth daily with breakfast.     predniSONE (DELTASONE) 5 MG tablet take 8 tablets daily for 3 days, 6 tabs daily for 3 days, 4 tabs daily for 3 days, 2 tabs daily for 3 days, 1 tab daily for 3 days, then continue your home dose as before. (Patient not taking: Reported on 03/19/2021) 65 tablet 0   No current facility-administered medications for this visit.    Allergies:   Belimumab, Mycophenolate mofetil, Phenergan [promethazine hcl], Codeine, Meloxicam, Statins, Ezetimibe, Hydroxyzine, and Tape    Social History:  The patient  reports that she has never smoked. She has never used smokeless tobacco. She reports that she does not drink alcohol and does not use drugs.   Family History:  The patient's family history includes Cancer in her brother. She was adopted.    ROS:  Please see the history of present illness.   Otherwise, review of systems are positive for none.   All other systems are reviewed and negative.    PHYSICAL EXAM: VS:  BP 102/72 (BP Location: Left Arm, Patient Position: Sitting, Cuff Size: Large)    Pulse 81    Ht 5\' 3"  (1.6 m)    Wt 184 lb 3.2 oz (83.6 kg)    SpO2 97%    BMI 32.63 kg/m  , BMI Body mass index is 32.63 kg/m. GEN: Well nourished, well developed, in no acute distress HEENT: normal Neck: no JVD, carotid bruits, or masses Cardiac: RRR; no murmurs, rubs, or gallops,no edema   Respiratory:  clear to auscultation bilaterally, diminished BS GI: soft, nontender, nondistended, + BS MS: no deformity or atrophy Skin: warm and dry, no rash Neuro:  Strength and sensation are intact Psych: euthymic mood, full affect   EKG:  EKG is ordered today. The ekg ordered today demonstrates NSR rate 81. Low voltage. I have personally reviewed and interpreted this study.    Recent Labs: 11/20/2020: TSH 2.49 02/04/2021: B Natriuretic Peptide 259.0; Magnesium 2.2 02/19/2021: ALT 16; BUN 11; Creatinine, Ser 1.10; Hemoglobin 12.6;  Platelets 190.0; Potassium 3.4; Pro B Natriuretic peptide (BNP) 37.0; Sodium 135    Lipid Panel    Component Value Date/Time   CHOL 294 (H) 11/20/2020 1245   TRIG 143.0 11/20/2020 1245   HDL 66.50 11/20/2020 1245   CHOLHDL 4 11/20/2020 1245   VLDL 28.6 11/20/2020 1245   LDLCALC 199 (H) 11/20/2020 1245   LDLDIRECT 207.0 04/25/2020 1132      Wt Readings from Last 3 Encounters:  03/19/21 184 lb 3.2 oz (83.6 kg)  02/19/21 180 lb 6 oz (81.8 kg)  01/30/21 204 lb (92.5 kg)      Other studies Reviewed: Additional studies/ records that were reviewed today include:   See HPI   ASSESSMENT AND PLAN:  1.  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 On oxygen. Persistent DOE at low level. I think we need to make sure she does not have pulmonary HTN although recent Echo was reassuring. Will arrange for right heart cath later this week. I think her risk of CAD is quite low. No significant chest pain, normal Ecg and recent CT showing no coronary calcification. Visualized portions of the left main, proximal LAD and LCx on CT without plaque.  2. SLE  3. DM 4. HLD.    Current medicines are reviewed at length with the patient today.  The patient does not have concerns regarding medicines.  The following changes have been made:  no change  Labs/ tests ordered today include:   Orders Placed This Encounter   Procedures   Basic metabolic panel   CBC w/Diff/Platelet   PT and PTT   EKG 12-Lead   Will arrange right heart cath later this week. The procedure and risks were reviewed including but not limited to death, myocardial infarction,arrythmias, bleeding, vascular injury. The patient voices understanding and is agreeable to proceed..      Disposition:   FU  TBD  Signed, Jaylun Fleener Martinique, MD  03/19/2021 4:29 PM    [facility] [address] [place], [place], [address] Phone [phone], Fax [phone]

## 2021-03-15 NOTE — Progress Notes (Signed)
Cardiology Office Note   Date:  03/19/2021   ID:  Dana Doyle, DOB [birth year], MRN [id number]  PCP:  Michela Pitcher, NP  Cardiologist:   Rai Severns Martinique, MD   Chief Complaint  Patient presents with   Shortness of Breath      History of Present Illness: Dana Doyle is a 70 y.o. female who is seen at the request of Karl Ito NP for evaluation of hypercholesterolemia. She has a history of HTN, HLD, DM, hypothyroidism, Lupus, and obesity. Carries diagnosis of MV prolapse. She was seen remotely by me in 2014. Reported history of  an echocardiogram and stress test 10 years prior. She reports she had a heart catheterization 30 years ago which demonstrated no significant coronary disease. Myoview in 2014 was low risk with mild anterior attenuation and normal EF. Echo at that time was normal with no evidence of MV prolapse.   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 On chronic steroids and oxygen. Followed by pulmonary at [facility].  She was admitted in November with acute on chronic respiratory failure due to Covid 19 PNA. CT at that time showed diffuse pulmonary infiltrates. No PE. No significant coronary calcification. Minimal aortic calcification.  She has persistent SOB since then with minimal exertion. She had Echo at [facility] that was normal. Referred for cardiac evaluation. She denies any chest pain or jaw pain. No swelling. No cough.   Recently lost her husband who passed away after many years of severe heart disease.    Past Medical History:  Diagnosis Date   Anxiety    Arthritis    Diabetes mellitus without complication (HCC)    GERD (gastroesophageal reflux disease)    Giardia    Hyperlipidemia    Hypertension    IBS (irritable bowel syndrome)    Interstitial lung disease (HCC)    Lupus (HCC)    MVP (mitral valve prolapse)    Obesity    Thyroid disease     Past Surgical History:  Procedure Laterality Date   ABDOMINAL HYSTERECTOMY     total    CHOLECYSTECTOMY     CYSTECTOMY Right    hip   REPLACEMENT TOTAL KNEE BILATERAL     REVISION TOTAL KNEE ARTHROPLASTY Right    TONSILLECTOMY AND ADENOIDECTOMY       Current Outpatient Medications  Medication Sig Dispense Refill   albuterol (VENTOLIN HFA) 108 (90 Base) MCG/ACT inhaler Inhale 1-2 puffs into the lungs every 6 (six) hours as needed for wheezing or shortness of breath. 8 g 1   Cholecalciferol (VITAMIN D3) 250 MCG (10000 UT) capsule Take 10,000 Units by mouth daily.     furosemide (LASIX) 20 MG tablet TAKE 1 TABLET BY MOUTH TWICE A DAY (Patient taking differently: Take 20 mg by mouth 2 (two) times daily.) 180 tablet 1   hydroxychloroquine (PLAQUENIL) 200 MG tablet Take 400 mg by mouth daily.     KRILL OIL PO Take by mouth daily in the afternoon.     levothyroxine (SYNTHROID) 50 MCG tablet TAKE 1 TABLET BY MOUTH EVERY DAY BEFORE BREAKFAST (Patient taking differently: Take 50 mcg by mouth daily before breakfast.) 90 tablet 3   metoprolol succinate (TOPROL-XL) 25 MG 24 hr tablet TAKE 1 TABLET (25 MG TOTAL) BY MOUTH DAILY. 90 tablet 1   NIFEdipine (ADALAT CC) 30 MG 24 hr tablet Take 30 mg by mouth daily.     OXYGEN Inhale 2.5-3 L into the lungs as needed.  pantoprazole (PROTONIX) 40 MG tablet TAKE 1 TABLET BY MOUTH EVERY DAY 90 tablet 0   Potassium 99 MG TABS Take 99 mg by mouth daily.     predniSONE (DELTASONE) 10 MG tablet Take 10 mg by mouth daily.     riTUXimab (RITUXAN IV) Inject into the vein every 6 (six) months.     sertraline (ZOLOFT) 100 MG tablet TAKE 1 TABLET BY MOUTH EVERY DAY (Patient taking differently: Take 100 mg by mouth daily.) 90 tablet 1   sulfamethoxazole-trimethoprim (BACTRIM DS) 800-160 MG tablet Take 1 tablet by mouth every Monday, Wednesday, and Friday.     acetaminophen (TYLENOL) 500 MG tablet Take 500 mg by mouth every 6 (six) hours as needed for moderate pain or headache. (Patient not taking: Reported on 03/19/2021)     budesonide-formoterol (SYMBICORT)  160-4.5 MCG/ACT inhaler Inhale 2 puffs into the lungs in the morning and at bedtime.     busPIRone (BUSPAR) 5 MG tablet Take 1 tablet (5 mg total) by mouth 2 (two) times daily. (Patient not taking: Reported on 03/19/2021) 60 tablet 1   Multiple Vitamins-Minerals (WOMENS MULTIVITAMIN PO) Take 1 tablet by mouth daily. (Patient not taking: Reported on 03/19/2021)     ondansetron (ZOFRAN) 4 MG tablet Take 1 tablet (4 mg total) by mouth every 8 (eight) hours as needed for nausea or vomiting. (Patient not taking: Reported on 03/19/2021) 20 tablet 0   predniSONE (DELTASONE) 1 MG tablet Take 3 mg by mouth daily with breakfast.     predniSONE (DELTASONE) 5 MG tablet take 8 tablets daily for 3 days, 6 tabs daily for 3 days, 4 tabs daily for 3 days, 2 tabs daily for 3 days, 1 tab daily for 3 days, then continue your home dose as before. (Patient not taking: Reported on 03/19/2021) 65 tablet 0   No current facility-administered medications for this visit.    Allergies:   Belimumab, Mycophenolate mofetil, Phenergan [promethazine hcl], Codeine, Meloxicam, Statins, Ezetimibe, Hydroxyzine, and Tape    Social History:  The patient  reports that she has never smoked. She has never used smokeless tobacco. She reports that she does not drink alcohol and does not use drugs.   Family History:  The patient's family history includes Cancer in her brother. She was adopted.    ROS:  Please see the history of present illness.   Otherwise, review of systems are positive for none.   All other systems are reviewed and negative.    PHYSICAL EXAM: VS:  BP 102/72 (BP Location: Left Arm, Patient Position: Sitting, Cuff Size: Large)    Pulse 81    Ht 5\' 3"  (1.6 m)    Wt 184 lb 3.2 oz (83.6 kg)    SpO2 97%    BMI 32.63 kg/m  , BMI Body mass index is 32.63 kg/m. GEN: Well nourished, well developed, in no acute distress HEENT: normal Neck: no JVD, carotid bruits, or masses Cardiac: RRR; no murmurs, rubs, or gallops,no edema   Respiratory:  clear to auscultation bilaterally, diminished BS GI: soft, nontender, nondistended, + BS MS: no deformity or atrophy Skin: warm and dry, no rash Neuro:  Strength and sensation are intact Psych: euthymic mood, full affect   EKG:  EKG is ordered today. The ekg ordered today demonstrates NSR rate 81. Low voltage. I have personally reviewed and interpreted this study.    Recent Labs: 11/20/2020: TSH 2.49 02/04/2021: B Natriuretic Peptide 259.0; Magnesium 2.2 02/19/2021: ALT 16; BUN 11; Creatinine, Ser 1.10; Hemoglobin 12.6;  Platelets 190.0; Potassium 3.4; Pro B Natriuretic peptide (BNP) 37.0; Sodium 135    Lipid Panel    Component Value Date/Time   CHOL 294 (H) 11/20/2020 1245   TRIG 143.0 11/20/2020 1245   HDL 66.50 11/20/2020 1245   CHOLHDL 4 11/20/2020 1245   VLDL 28.6 11/20/2020 1245   LDLCALC 199 (H) 11/20/2020 1245   LDLDIRECT 207.0 04/25/2020 1132      Wt Readings from Last 3 Encounters:  03/19/21 184 lb 3.2 oz (83.6 kg)  02/19/21 180 lb 6 oz (81.8 kg)  01/30/21 204 lb (92.5 kg)      Other studies Reviewed: Additional studies/ records that were reviewed today include:   See HPI   ASSESSMENT AND PLAN:  1.  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 On oxygen. Persistent DOE at low level. I think we need to make sure she does not have pulmonary HTN although recent Echo was reassuring. Will arrange for right heart cath later this week. I think her risk of CAD is quite low. No significant chest pain, normal Ecg and recent CT showing no coronary calcification. Visualized portions of the left main, proximal LAD and LCx on CT without plaque.  2. SLE  3. DM 4. HLD.    Current medicines are reviewed at length with the patient today.  The patient does not have concerns regarding medicines.  The following changes have been made:  no change  Labs/ tests ordered today include:   Orders Placed This Encounter   Procedures   Basic metabolic panel   CBC w/Diff/Platelet   PT and PTT   EKG 12-Lead   Will arrange right heart cath later this week. The procedure and risks were reviewed including but not limited to death, myocardial infarction,arrythmias, bleeding, vascular injury. The patient voices understanding and is agreeable to proceed..      Disposition:   FU  TBD  Signed, Breydon Senters Martinique, MD  03/19/2021 4:29 PM    [facility] [address], [place], [place], [address] Phone [phone], Fax [phone]

## 2021-03-16 DIAGNOSIS — R0609 Other forms of dyspnea: Secondary | ICD-10-CM | POA: Diagnosis not present

## 2021-03-16 DIAGNOSIS — J849 Interstitial pulmonary disease, unspecified: Secondary | ICD-10-CM | POA: Diagnosis not present

## 2021-03-19 ENCOUNTER — Other Ambulatory Visit: Payer: Self-pay

## 2021-03-19 ENCOUNTER — Other Ambulatory Visit: Payer: Self-pay | Admitting: Cardiology

## 2021-03-19 ENCOUNTER — Ambulatory Visit: Payer: Medicare HMO | Admitting: Cardiology

## 2021-03-19 ENCOUNTER — Encounter: Payer: Self-pay | Admitting: Cardiology

## 2021-03-19 VITALS — BP 102/72 | HR 81 | Ht 63.0 in | Wt 184.2 lb

## 2021-03-19 DIAGNOSIS — J849 Interstitial pulmonary disease, unspecified: Secondary | ICD-10-CM | POA: Diagnosis not present

## 2021-03-19 DIAGNOSIS — R0609 Other forms of dyspnea: Secondary | ICD-10-CM

## 2021-03-19 MED ORDER — SODIUM CHLORIDE 0.9% FLUSH: 3.0000 mL | Freq: Two times a day (BID) | INTRAVENOUS | Status: DC

## 2021-03-19 NOTE — Patient Instructions (Addendum)
Medication Instructions:  Continue same medications    Lab Work: Bmet,cbc,pt today   Testing/Procedures: Right Cardiac Cath    Follow instructions below   Follow-Up: At [facility], you and your health needs are our priority.  As part of our continuing mission to provide you with exceptional heart care, we have created designated Provider Care Teams.  These Care Teams include your primary Cardiologist (physician) and Advanced Practice Providers (APPs -  Physician Assistants and Nurse Practitioners) who all work together to provide you with the care you need, when you need it.  We recommend signing up for the patient portal called "MyChart".  Sign up information is provided on this After Visit Summary.  MyChart is used to connect with patients for Virtual Visits (Telemedicine).  Patients are able to view lab/test results, encounter notes, upcoming appointments, etc.  Non-urgent messages can be sent to your provider as well.   To learn more about what you can do with MyChart, go to [email or web address].      Your next appointment:      The format for your next appointment: Office   Provider:  [place] [address] Dept: [phone] Loc: [place]  03/19/2021  You are scheduled for a Right Cardiac Cath on Friday 03/23/21, with Dr.Jordan.  1. Please arrive at the [facility] (Main Entrance A) at [facility]: [address] [place], [place] [address] at 5:30 am (This time is two hours before your procedure to ensure your preparation). Free valet parking service is available.   Special note: Every effort is made to have your procedure done on time. Please understand that emergencies sometimes delay scheduled procedures.  2. Diet: Do not eat solid foods after midnight.  The patient may have clear liquids until 5am  upon the day of the procedure.  3. Labs: You will need to have blood drawn on Monday 12/19. You do not need to be fasting.  4. Medication instructions in preparation for your procedure:    Hold Furosemide morning of cath       On the morning of your procedure, take Aspirin 81 mg and any morning medicines NOT listed above.  You may use sips of water.  5. Plan for one night stay--bring personal belongings. 6. Bring a current list of your medications and current insurance cards. 7. You MUST have a responsible person to drive you home. 8. Someone MUST be with you the first 24 hours after you arrive home or your discharge will be delayed. 9. Please wear clothes that are easy to get on and off and wear slip-on shoes.  Thank you for allowing [place] to care for you!   -- [facility]

## 2021-03-20 LAB — BASIC METABOLIC PANEL
BUN/Creatinine Ratio: 15 (ref 12–28)
BUN: 15 mg/dL (ref 8–27)
CO2: 25 mmol/L (ref 20–29)
Calcium: 9.4 mg/dL (ref 8.7–10.3)
Chloride: 97 mmol/L (ref 96–106)
Creatinine, Ser: 1 mg/dL (ref 0.57–1.00)
Glucose: 93 mg/dL (ref 70–99)
Potassium: 3.3 mmol/L — ABNORMAL LOW (ref 3.5–5.2)
Sodium: 140 mmol/L (ref 134–144)
eGFR: 61 mL/min/{1.73_m2} (ref >59)

## 2021-03-20 LAB — CBC WITH DIFFERENTIAL/PLATELET
Basophils Absolute: 0 10*3/uL (ref 0.0–0.2)
Basos: 0 %
EOS (ABSOLUTE): 0 10*3/uL (ref 0.0–0.4)
Eos: 0 %
Hematocrit: 37.9 % (ref 34.0–46.6)
Hemoglobin: 12.9 g/dL (ref 11.1–15.9)
Immature Grans (Abs): 0 10*3/uL (ref 0.0–0.1)
Immature Granulocytes: 0 %
Lymphocytes Absolute: 1.7 10*3/uL (ref 0.7–3.1)
Lymphs: 17 %
MCH: 29.2 pg (ref 26.6–33.0)
MCHC: 34 g/dL (ref 31.5–35.7)
MCV: 86 fL (ref 79–97)
Monocytes Absolute: 0.8 10*3/uL (ref 0.1–0.9)
Monocytes: 8 %
Neutrophils Absolute: 7.6 10*3/uL — ABNORMAL HIGH (ref 1.4–7.0)
Neutrophils: 75 %
Platelets: 278 10*3/uL (ref 150–450)
RBC: 4.42 x10E6/uL (ref 3.77–5.28)
RDW: 15.9 % — ABNORMAL HIGH (ref 11.7–15.4)
WBC: 10.2 10*3/uL (ref 3.4–10.8)

## 2021-03-20 LAB — PT AND PTT
INR: 1 (ref 0.9–1.2)
Prothrombin Time: 10.1 s (ref 9.1–12.0)
aPTT: 23 s — ABNORMAL LOW (ref 24–33)

## 2021-03-21 ENCOUNTER — Telehealth: Payer: Self-pay | Admitting: *Deleted

## 2021-03-21 NOTE — Telephone Encounter (Signed)
Right Heart Cath  scheduled at [facility] for: Friday March 23, 2021 7:30 [facility] Main Entrance A [facility]) at: 5:30 AM   Diet-no solid food after midnight prior to cath, clear liquids until 5 AM day of procedure.  Medication instructions for procedure: -Hold:  Lasix-AM of procedure -Usual morning medications can be taken pre-cath with sips of water, including potassium supplement.    Confirmed patient has responsible adult to drive home post procedure and be with patient first 24 hours after arriving home.  [facility] does allow one visitor to accompany you and wait in the hospital waiting room while you are there for your procedure. You and your visitor will be asked to wear a mask once you enter the hospital.   Patient reports does not currently have any new symptoms concerning for COVID-19 and no household members with COVID-19 like illness.     Reviewed procedure/mask/visitor instructions with patient.  Per Dr Martinique 03/20/21: Copied from 03/19/21 BMP results Increase potassium supplement to 2 tablets daily.                Patient advised to increase potassium supplement to 2 tablets daily.

## 2021-03-23 ENCOUNTER — Encounter (HOSPITAL_COMMUNITY): Payer: Self-pay | Admitting: Cardiology

## 2021-03-23 ENCOUNTER — Encounter (HOSPITAL_COMMUNITY)
Admission: RE | Disposition: A | Discharge status: Home / Self Care | Payer: Self-pay | Source: Home / Self Care | Attending: Cardiology

## 2021-03-23 ENCOUNTER — Other Ambulatory Visit: Payer: Self-pay

## 2021-03-23 ENCOUNTER — Ambulatory Visit (HOSPITAL_COMMUNITY)
Admission: RE | Admit: 2021-03-23 | Discharge: 2021-03-23 | Disposition: A | Discharge status: Home / Self Care | Payer: Medicare HMO | Attending: Cardiology | Admitting: Cardiology

## 2021-03-23 DIAGNOSIS — E669 Obesity, unspecified: Secondary | ICD-10-CM | POA: Insufficient documentation

## 2021-03-23 DIAGNOSIS — E7800 Pure hypercholesterolemia, unspecified: Secondary | ICD-10-CM | POA: Diagnosis not present

## 2021-03-23 DIAGNOSIS — I10 Essential (primary) hypertension: Secondary | ICD-10-CM | POA: Diagnosis not present

## 2021-03-23 DIAGNOSIS — J849 Interstitial pulmonary disease, unspecified: Secondary | ICD-10-CM | POA: Diagnosis not present

## 2021-03-23 DIAGNOSIS — R0609 Other forms of dyspnea: Secondary | ICD-10-CM | POA: Diagnosis not present

## 2021-03-23 DIAGNOSIS — E785 Hyperlipidemia, unspecified: Secondary | ICD-10-CM | POA: Diagnosis not present

## 2021-03-23 DIAGNOSIS — Z6832 Body mass index (BMI) 32.0-32.9, adult: Secondary | ICD-10-CM | POA: Diagnosis not present

## 2021-03-23 DIAGNOSIS — Z9981 Dependence on supplemental oxygen: Secondary | ICD-10-CM | POA: Diagnosis not present

## 2021-03-23 DIAGNOSIS — M329 Systemic lupus erythematosus, unspecified: Secondary | ICD-10-CM | POA: Insufficient documentation

## 2021-03-23 DIAGNOSIS — I341 Nonrheumatic mitral (valve) prolapse: Secondary | ICD-10-CM | POA: Insufficient documentation

## 2021-03-23 DIAGNOSIS — E039 Hypothyroidism, unspecified: Secondary | ICD-10-CM | POA: Diagnosis not present

## 2021-03-23 DIAGNOSIS — Z8616 Personal history of COVID-19: Secondary | ICD-10-CM | POA: Insufficient documentation

## 2021-03-23 DIAGNOSIS — R0602 Shortness of breath: Secondary | ICD-10-CM | POA: Diagnosis not present

## 2021-03-23 DIAGNOSIS — E119 Type 2 diabetes mellitus without complications: Secondary | ICD-10-CM | POA: Diagnosis not present

## 2021-03-23 DIAGNOSIS — Z7952 Long term (current) use of systemic steroids: Secondary | ICD-10-CM | POA: Diagnosis not present

## 2021-03-23 HISTORY — PX: RIGHT HEART CATH: CATH118263

## 2021-03-23 LAB — POCT I-STAT EG7
Acid-Base Excess: 3 mmol/L — ABNORMAL HIGH (ref 0.0–2.0)
Acid-Base Excess: 3 mmol/L — ABNORMAL HIGH (ref 0.0–2.0)
Bicarbonate: 27.8 mmol/L (ref 20.0–28.0)
Bicarbonate: 27.6 mmol/L (ref 20.0–28.0)
Calcium, Ion: 1.14 mmol/L — ABNORMAL LOW (ref 1.15–1.40)
Calcium, Ion: 1.18 mmol/L (ref 1.15–1.40)
HCT: 34 % — ABNORMAL LOW (ref 36.0–46.0)
HCT: 34 % — ABNORMAL LOW (ref 36.0–46.0)
Hemoglobin: 11.6 g/dL — ABNORMAL LOW (ref 12.0–15.0)
Hemoglobin: 11.6 g/dL — ABNORMAL LOW (ref 12.0–15.0)
O2 Saturation: 75 %
O2 Saturation: 77 %
Potassium: 3.3 mmol/L — ABNORMAL LOW (ref 3.5–5.1)
Potassium: 3.4 mmol/L — ABNORMAL LOW (ref 3.5–5.1)
Sodium: 139 mmol/L (ref 135–145)
Sodium: 138 mmol/L (ref 135–145)
TCO2: 29 mmol/L (ref 22–32)
TCO2: 29 mmol/L (ref 22–32)
pCO2, Ven: 42.9 mmHg — ABNORMAL LOW (ref 44.0–60.0)
pCO2, Ven: 42.7 mmHg — ABNORMAL LOW (ref 44.0–60.0)
pH, Ven: 7.419 (ref 7.250–7.430)
pH, Ven: 7.419 (ref 7.250–7.430)
pO2, Ven: 39 mmHg (ref 32.0–45.0)
pO2, Ven: 41 mmHg (ref 32.0–45.0)

## 2021-03-23 LAB — BASIC METABOLIC PANEL
Anion gap: 9 (ref 5–15)
BUN: 11 mg/dL (ref 8–23)
CO2: 28 mmol/L (ref 22–32)
Calcium: 8.8 mg/dL — ABNORMAL LOW (ref 8.9–10.3)
Chloride: 101 mmol/L (ref 98–111)
Creatinine, Ser: 1.17 mg/dL — ABNORMAL HIGH (ref 0.44–1.00)
GFR, Estimated: 51 mL/min — ABNORMAL LOW (ref >60)
Glucose, Bld: 98 mg/dL (ref 70–99)
Potassium: 3.4 mmol/L — ABNORMAL LOW (ref 3.5–5.1)
Sodium: 138 mmol/L (ref 135–145)

## 2021-03-23 LAB — GLUCOSE, CAPILLARY: Glucose-Capillary: 106 mg/dL — ABNORMAL HIGH (ref 70–99)

## 2021-03-23 SURGERY — RIGHT HEART CATH
Anesthesia: LOCAL

## 2021-03-23 MED ORDER — POTASSIUM CHLORIDE CRYS ER 20 MEQ PO TBCR: 20.0000 meq | EXTENDED_RELEASE_TABLET | Freq: Every day | ORAL | 3 refills | Status: DC

## 2021-03-23 MED ORDER — SODIUM CHLORIDE 0.9 % IV SOLN: 250.0000 mL | INTRAVENOUS | Status: DC | PRN

## 2021-03-23 MED ORDER — SODIUM CHLORIDE 0.9% FLUSH: 3.0000 mL | INTRAVENOUS | Status: DC | PRN

## 2021-03-23 MED ORDER — HEPARIN (PORCINE) IN NACL 1000-0.9 UT/500ML-% IV SOLN
INTRAVENOUS | Status: DC | PRN
  Administered 2021-03-23: 500 mL

## 2021-03-23 MED ORDER — POTASSIUM CHLORIDE CRYS ER 20 MEQ PO TBCR
EXTENDED_RELEASE_TABLET | ORAL | Status: AC
  Filled 2021-03-23: qty 2

## 2021-03-23 MED ORDER — POTASSIUM CHLORIDE CRYS ER 20 MEQ PO TBCR
40.0000 meq | EXTENDED_RELEASE_TABLET | Freq: Once | ORAL | Status: AC
  Administered 2021-03-23: 07:00:00 40 meq via ORAL

## 2021-03-23 MED ORDER — LIDOCAINE HCL (PF) 1 % IJ SOLN
INTRAMUSCULAR | Status: DC | PRN
  Administered 2021-03-23: 2 mL via INTRADERMAL

## 2021-03-23 MED ORDER — SODIUM CHLORIDE 0.9% FLUSH: 3.0000 mL | Freq: Two times a day (BID) | INTRAVENOUS | Status: DC

## 2021-03-23 MED ORDER — SODIUM CHLORIDE 0.9 % IV SOLN: INTRAVENOUS | Status: AC

## 2021-03-23 MED ORDER — ACETAMINOPHEN 325 MG PO TABS: 650.0000 mg | ORAL_TABLET | ORAL | Status: DC | PRN

## 2021-03-23 MED ORDER — LIDOCAINE HCL (PF) 1 % IJ SOLN
INTRAMUSCULAR | Status: AC
  Filled 2021-03-23: qty 30

## 2021-03-23 MED ORDER — SODIUM CHLORIDE 0.9 % IV SOLN: INTRAVENOUS | Status: DC

## 2021-03-23 MED ORDER — ONDANSETRON HCL 4 MG/2ML IJ SOLN: 4.0000 mg | Freq: Four times a day (QID) | INTRAMUSCULAR | Status: DC | PRN

## 2021-03-23 SURGICAL SUPPLY — 4 items
CATH BALLN WEDGE 5F 110CM (CATHETERS) ×2 IMPLANT
GUIDEWIRE .025 260CM (WIRE) ×2 IMPLANT
PACK CARDIAC CATHETERIZATION (CUSTOM PROCEDURE TRAY) ×2 IMPLANT
SHEATH GLIDE SLENDER 4/5FR (SHEATH) ×2 IMPLANT

## 2021-03-23 NOTE — Interval H&P Note (Signed)
History and Physical Interval Note:  03/23/2021 7:30 AM  Dana Doyle  has presented today for surgery, with the diagnosis of shortness of breath.  The various methods of treatment have been discussed with the patient and family. After consideration of risks, benefits and other options for treatment, the patient has consented to  Procedure(s): RIGHT HEART CATH (N/A) as a surgical intervention.  The patient's history has been reviewed, patient examined, no change in status, stable for surgery.  I have reviewed the patient's chart and labs.  Questions were answered to the patient's satisfaction.     Collier Salina [facility] 03/23/2021 7:30 AM

## 2021-03-23 NOTE — Discharge Instructions (Signed)
Call Dr Doug Sou office if any problems, questions, or concerns; call if any redness, drainage, fever, pain, swelling or bleeding right upper arm site

## 2021-03-28 ENCOUNTER — Encounter: Payer: Self-pay | Admitting: Cardiology

## 2021-03-28 MED ORDER — POTASSIUM CHLORIDE CRYS ER 20 MEQ PO TBCR: 40.0000 meq | EXTENDED_RELEASE_TABLET | Freq: Every day | ORAL | 3 refills | Status: DC

## 2021-03-28 NOTE — Telephone Encounter (Signed)
Pt updated with lab results along with MD's recommendations and verbalized understanding. New orders placed.    Peter M Martinique, MD  03/20/2021  9:06 AM EST     The following abnormalities are noted:  potassium is low otherwise labs are good.    All other values are normal, stable or within acceptable limits. Medication changes / Follow up labs / Other changes or recommendations:   Increase potassium supplement to 2 tablets daily.   Peter Martinique, MD 03/20/2021 9:05 AM

## 2021-04-03 ENCOUNTER — Telehealth: Payer: Self-pay | Admitting: Cardiology

## 2021-04-03 NOTE — Telephone Encounter (Signed)
Called patient, advised of lab results.  Patient aware to take two potassium tablets.   Will route to primary nurse to make aware.   Thanks!

## 2021-04-03 NOTE — Telephone Encounter (Signed)
Patient states she was returning call. Please advise  

## 2021-04-06 DIAGNOSIS — H6981 Other specified disorders of Eustachian tube, right ear: Secondary | ICD-10-CM | POA: Diagnosis not present

## 2021-04-06 DIAGNOSIS — H6121 Impacted cerumen, right ear: Secondary | ICD-10-CM | POA: Diagnosis not present

## 2021-04-06 DIAGNOSIS — H7201 Central perforation of tympanic membrane, right ear: Secondary | ICD-10-CM | POA: Diagnosis not present

## 2021-04-06 DIAGNOSIS — H903 Sensorineural hearing loss, bilateral: Secondary | ICD-10-CM | POA: Diagnosis not present

## 2021-04-09 ENCOUNTER — Telehealth: Payer: Self-pay

## 2021-04-09 NOTE — Chronic Care Management (AMB) (Addendum)
° ° °  Chronic Care Management Pharmacy Assistant   Name: Adalis Gatti  MRN: [id number] DOB: [birth year]   Reason for Encounter: CCM Reminder Call   Conditions to be addressed/monitored: HTN and DMII   Medications: Outpatient Encounter Medications as of 04/09/2021  Medication Sig Note   albuterol (VENTOLIN HFA) 108 (90 Base) MCG/ACT inhaler Inhale 1-2 puffs into the lungs every 6 (six) hours as needed for wheezing or shortness of breath.    budesonide-formoterol (SYMBICORT) 160-4.5 MCG/ACT inhaler Inhale 2 puffs into the lungs in the morning and at bedtime.    busPIRone (BUSPAR) 5 MG tablet Take 1 tablet (5 mg total) by mouth 2 (two) times daily. (Patient taking differently: Take 5 mg by mouth at bedtime as needed (sleep).)    Cholecalciferol (VITAMIN D3) 250 MCG (10000 UT) capsule Take 10,000 Units by mouth daily.    furosemide (LASIX) 20 MG tablet TAKE 1 TABLET BY MOUTH TWICE A DAY (Patient taking differently: Take 20 mg by mouth 2 (two) times daily.)    hydroxychloroquine (PLAQUENIL) 200 MG tablet Take 400 mg by mouth daily.    KRILL OIL PO Take 1 capsule by mouth daily in the afternoon.    levothyroxine (SYNTHROID) 50 MCG tablet TAKE 1 TABLET BY MOUTH EVERY DAY BEFORE BREAKFAST (Patient taking differently: Take 50 mcg by mouth daily before breakfast.)    metoprolol succinate (TOPROL-XL) 25 MG 24 hr tablet TAKE 1 TABLET (25 MG TOTAL) BY MOUTH DAILY.    ondansetron (ZOFRAN) 4 MG tablet Take 1 tablet (4 mg total) by mouth every 8 (eight) hours as needed for nausea or vomiting. 03/21/2021: On hand   OXYGEN Inhale 2.5-3 L into the lungs as needed.    pantoprazole (PROTONIX) 40 MG tablet TAKE 1 TABLET BY MOUTH EVERY DAY    potassium chloride SA (KLOR-CON M) 20 MEQ tablet Take 2 tablets (40 mEq total) by mouth daily.    predniSONE (DELTASONE) 10 MG tablet Take 10 mg by mouth daily.    riTUXimab (RITUXAN IV) Inject into the vein every 6 (six) months.    sertraline (ZOLOFT) 100 MG tablet TAKE  1 TABLET BY MOUTH EVERY DAY (Patient taking differently: Take 100 mg by mouth daily.)    sulfamethoxazole-trimethoprim (BACTRIM DS) 800-160 MG tablet Take 1 tablet by mouth every Monday, Wednesday, and Friday.    No facility-administered encounter medications on file as of 04/09/2021.   Neziah Vogelgesang was contacted to remind of upcoming telephone visit with Debbora Dus on 04/16/21 at 2:00pm. Patient was reminded to have all medications, supplements and any blood glucose and blood pressure readings available for review at appointment. If unable to reach, a voicemail was left for patient.    Are you having any problems with your medications? No   Do you have any concerns you like to discuss with the pharmacist? No    Star Rating Drugs: Medication:  Last Fill: Day Supply No star meds identified  Metoprolol 25mg  -  - Furosemide 20mg  01/29/21 90 Klor-Con 20 meq 03/23/21 [place], CPP notified  Avel Sensor, [place] Assistant [phone]  I have reviewed the care management and care coordination activities outlined in this encounter and I am certifying that I agree with the content of this note. No further action required.  Debbora Dus, PharmD Clinical Pharmacist [facility] at [facility] [phone]

## 2021-04-16 ENCOUNTER — Telehealth: Payer: Self-pay

## 2021-04-16 ENCOUNTER — Telehealth: Payer: Medicare HMO

## 2021-04-16 NOTE — Telephone Encounter (Signed)
°  Chronic Care Management   Outreach Note  04/16/2021 Name: Dana Doyle MRN: [id number] DOB: 17-Feb-1951  Patient had a phone appointment scheduled with clinical pharmacist today.   An unsuccessful telephone outreach was attempted. The patient was referred to the pharmacist for assistance with medications, care management and care coordination.    Patient will NOT be penalized in any way for missing a CCM appointment. The no-show fee does not apply.   A message was left to return call to: [phone].   Debbora Dus, PharmD Clinical Pharmacist [facility] at [facility] [phone]

## 2021-04-16 NOTE — Progress Notes (Deleted)
Chronic Care Management Pharmacy Note  04/16/2021 Name:  Dana Doyle MRN:  [id number] DOB:  1950/08/04  Subjective: Dana Doyle is an 71 y.o. year old female who is a primary patient of Michela Pitcher, NP.  The CCM team was consulted for assistance with disease management and care coordination needs.    Engaged with patient by telephone for follow up visit in response to provider referral for pharmacy case management and/or care coordination services.   CCM Consent 04/28/20  Consent to Services:  The patient was given information about Chronic Care Management services, agreed to services, and gave verbal consent prior to initiation of services.  Please see initial visit note for detailed documentation.   Patient Care Team: Michela Pitcher, NP as PCP - Claris Gower, [facility] as Pharmacist (Pharmacist) [place]  Recent office visits: 11/20/20 - Karl Ito, NP - Pt presented for initial visit.   Recent consult visits: 03/19/21 - [place] Peter, MD, Cardiology - Pt presented for shortness of breath, ILD. Order right heart cath to rule out pulmonary hypertension. 03/01/21 - [facility] - Pt presented for ILD. Increase prednisone to 20 mg for 1 week, then 15 mg x 1 week, then 10 mg daily until I see you next. Continue bactrim and Symbicort inhaler - use this twice daily and rinse your mouth out afterwards. Reschedule for Rituxan. Continue home physical therapy. Order echocardiogram.  Hospital visits: 03/23/21 - Pt presented for right heart cath 01/29/21 - 02/05/21 - Admission for acute respiratory failure due to COVID-19.  Allergies  Allergen Reactions   Belimumab Nausea And Vomiting and Other (See Comments)    (Benlysta)   Mycophenolate Mofetil Other (See Comments) and Nausea And Vomiting   Phenergan [Promethazine Hcl] Anaphylaxis   Codeine Hives, Itching and Other (See Comments)   Meloxicam Hives   Statins Other (See Comments)     Muscle spasm and cramps   Ezetimibe Other (See Comments)    Muscle spasms   Hydroxyzine Other (See Comments)    Severe nightmares   Tape     Other reaction(s): Other (See Comments) Very thin skin due to chronic steroid use    Objective:  Lab Results  Component Value Date   CREATININE 1.17 (H) 03/23/2021   BUN 11 03/23/2021   GFR 51.46 (L) 02/19/2021   GFRNONAA 51 (L) 03/23/2021   NA 139 03/23/2021   NA 138 03/23/2021   K 3.3 (L) 03/23/2021   K 3.4 (L) 03/23/2021   CALCIUM 8.8 (L) 03/23/2021   CO2 28 03/23/2021    Lab Results  Component Value Date/Time   HGBA1C 5.5 11/20/2020 12:45 PM   HGBA1C 5.3 04/25/2020 11:32 AM   GFR 51.46 (L) 02/19/2021 03:44 PM   GFR 53.90 (L) 11/20/2020 12:45 PM    Lab Results  Component Value Date   CHOL 294 (H) 11/20/2020   HDL 66.50 11/20/2020   LDLCALC 199 (H) 11/20/2020   LDLDIRECT 207.0 04/25/2020   TRIG 143.0 11/20/2020   CHOLHDL 4 11/20/2020   Hepatic Function Latest Ref Rng & Units 02/19/2021 02/04/2021 02/03/2021  Total Protein 6.0 - 8.3 g/dL 6.5 4.7(L) 5.3(L)  Albumin 3.5 - 5.2 g/dL 3.9 2.6(L) 2.9(L)  AST 0 - 37 U/L 19 17 18   ALT 0 - 35 U/L 16 14 11   Alk Phosphatase 39 - 117 U/L 81 62 69  Total Bilirubin 0.2 - 1.2 mg/dL 0.7 0.3 0.6   Lab Results  Component Value Date/Time  TSH 2.49 11/20/2020 12:45 PM   TSH 4.78 (H) 04/25/2020 11:32 AM   FREET4 0.61 11/20/2020 12:45 PM   FREET4 0.74 04/25/2020 11:32 AM   CBC Latest Ref Rng & Units 03/23/2021 03/23/2021 03/19/2021  WBC 3.4 - 10.8 x10E3/uL - - 10.2  Hemoglobin 12.0 - 15.0 g/dL 11.6(L) 11.6(L) 12.9  Hematocrit 36.0 - 46.0 % 34.0(L) 34.0(L) 37.9  Platelets 150 - 450 x10E3/uL - - 278   Lab Results  Component Value Date/Time   VD25OH 85 10/20/2012 12:45 PM   Clinical ASCVD: No  The 10-year ASCVD risk score (Arnett DK, et al., 2019) is: 18%   Values used to calculate the score:     Age: 63 years     Sex: Female     Is Non-Hispanic African American: No     Diabetic:  Yes     Tobacco smoker: No     Systolic Blood Pressure: 732 mmHg     Is BP treated: Yes     HDL Cholesterol: 66.5 mg/dL     Total Cholesterol: 294 mg/dL    Depression screen [facility] 2/9 04/25/2020  Decreased Interest 0  Down, Depressed, Hopeless 0  PHQ - 2 Score 0    Social History   Tobacco Use  Smoking Status Never  Smokeless Tobacco Never   BP Readings from Last 3 Encounters:  03/23/21 (!) 113/58  03/19/21 102/72  02/19/21 106/60   Pulse Readings from Last 3 Encounters:  03/23/21 (!) 59  03/19/21 81  02/19/21 81   Wt Readings from Last 3 Encounters:  03/23/21 183 lb (83 kg)  03/19/21 184 lb 3.2 oz (83.6 kg)  02/19/21 180 lb 6 oz (81.8 kg)    Assessment/Interventions: Review of patient past medical history, allergies, medications, health status, including review of consultants reports, laboratory and other test data, was performed as part of comprehensive evaluation and provision of chronic care management services.   SDOH:  (Social Determinants of Health) assessments and interventions performed: Yes    CCM Care Plan  Allergies  Allergen Reactions   Belimumab Nausea And Vomiting and Other (See Comments)    (Benlysta)   Mycophenolate Mofetil Other (See Comments) and Nausea And Vomiting   Phenergan [Promethazine Hcl] Anaphylaxis   Codeine Hives, Itching and Other (See Comments)   Meloxicam Hives   Statins Other (See Comments)    Muscle spasm and cramps   Ezetimibe Other (See Comments)    Muscle spasms   Hydroxyzine Other (See Comments)    Severe nightmares   Tape     Other reaction(s): Other (See Comments) Very thin skin due to chronic steroid use     Medications Reviewed Today     Reviewed by Ala Dach, RN (Registered Nurse) on 03/23/21 at [phone]  Med List Status: Complete   Medication Order Taking? Sig Documenting Provider Last Dose Status Informant  acetaminophen (TYLENOL) 500 MG tablet [id number] No Take 500 mg by mouth every 6 (six) hours as  needed for moderate pain or headache. [provider] More than a month Active Self           Med Note Kenton Kingfisher, Germaine Pomfret Mar 21, 2021  9:26 AM) rarely  albuterol (VENTOLIN HFA) 108 (90 Base) MCG/ACT inhaler [id number] Yes Inhale 1-2 puffs into the lungs every 6 (six) hours as needed for wheezing or shortness of breath. Michela Pitcher, NP 03/22/2021 Active Self  budesonide-formoterol (SYMBICORT) 160-4.5 MCG/ACT inhaler [id number] Yes Inhale 2 puffs into the lungs  in the morning and at bedtime. [provider] 03/22/2021 Active Self  busPIRone (BUSPAR) 5 MG tablet [id number] Yes Take 1 tablet (5 mg total) by mouth 2 (two) times daily.  Patient taking differently: Take 5 mg by mouth at bedtime as needed (sleep).   Michela Pitcher, NP 03/22/2021 Active Self  Cholecalciferol (VITAMIN D3) 250 MCG (10000 UT) capsule [id number] Yes Take 10,000 Units by mouth daily. [provider] 03/22/2021 Active Self  furosemide (LASIX) 20 MG tablet [id number] Yes TAKE 1 TABLET BY MOUTH TWICE A DAY  Patient taking differently: Take 20 mg by mouth 2 (two) times daily.   Kennyth Arnold, FNP 03/22/2021 Active Self  hydroxychloroquine (PLAQUENIL) 200 MG tablet [id number] Yes Take 400 mg by mouth daily. [provider] 03/22/2021 Active Self  KRILL OIL PO [id number] Yes Take 1 capsule by mouth daily in the afternoon. [provider] 03/22/2021 Active Self  levothyroxine (SYNTHROID) 50 MCG tablet [id number] Yes TAKE 1 TABLET BY MOUTH EVERY DAY BEFORE BREAKFAST  Patient taking differently: Take 50 mcg by mouth daily before breakfast.   Michela Pitcher, NP 03/22/2021 Active Self  metoprolol succinate (TOPROL-XL) 25 MG 24 hr tablet [id number] Yes TAKE 1 TABLET (25 MG TOTAL) BY MOUTH DAILY. Jearld Fenton, NP 03/22/2021 Active Self  ondansetron (ZOFRAN) 4 MG tablet [id number] No Take 1 tablet (4 mg total) by mouth every 8 (eight) hours as needed for nausea or vomiting. Michela Pitcher, NP  More than a month Active Self           Med Note Jeanie Cooks Mar 21, 2021  9:27 AM) On hand  OXYGEN [id number] Yes Inhale 2.5-3 L into the lungs as needed. [provider] 03/23/2021 Active Self  pantoprazole (PROTONIX) 40 MG tablet [id number] Yes TAKE 1 TABLET BY MOUTH EVERY DAY Michela Pitcher, NP 03/22/2021 Active Self  Potassium 99 MG TABS [id number] Yes Take 99 mg by mouth 2 (two) times daily. [provider] 03/22/2021 Active Self  predniSONE (DELTASONE) 10 MG tablet [id number] Yes Take 10 mg by mouth daily. [provider] 03/22/2021 Active Self  predniSONE (DELTASONE) 5 MG tablet [id number] No take 8 tablets daily for 3 days, 6 tabs daily for 3 days, 4 tabs daily for 3 days, 2 tabs daily for 3 days, 1 tab daily for 3 days, then continue your home dose as before.  Patient not taking: Reported on 03/21/2021   Thurnell Lose, MD Not Taking Consider Medication Status and Discontinue (Completed Course) Self  riTUXimab (RITUXAN IV) [id number] No Inject into the vein every 6 (six) months. [provider] More than a month Active Self  sertraline (ZOLOFT) 100 MG tablet [id number] Yes TAKE 1 TABLET BY MOUTH EVERY DAY  Patient taking differently: Take 100 mg by mouth daily.   Kennyth Arnold, [place] 03/22/2021 Active Self  sodium chloride flush (NS) 0.9 % injection 3 mL [id number]   [place], Peter M, MD  Active   sulfamethoxazole-trimethoprim (BACTRIM DS) 800-160 MG tablet [id number] Yes Take 1 tablet by mouth every Monday, Wednesday, and Friday. 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 ([place]) 03/23/2021   Otitis externa 03-Mar-2021   Thrush 03-Mar-2021   Disappearance and death of family member 03-03-2021   Dyspnea on exertion 2021-03-03   Acute respiratory failure  due to COVID-19 [facility]) 01/30/2021   Dizziness 11/20/2020    Anxiety 10/07/2019   GERD (gastroesophageal reflux disease) 10/07/2019   SLE (systemic lupus erythematosus related syndrome) ([place]) 10/07/2019   Chronic respiratory failure ([place]) 10/07/2019   Hypercholesterolemia 10/30/2012   DJD (degenerative joint disease) 10/20/2012   HTN (hypertension) 10/20/2012   DM2 (diabetes mellitus, type 2) ([place]) 10/20/2012   Hypothyroidism 10/20/2012   Irritable bowel syndrome 10/20/2012    Immunization History  Administered Date(s) Administered   Influenza, High Dose Seasonal PF 01/27/2020   PFIZER(Purple Top)SARS-COV-2 Vaccination 11/18/2019, 12/09/2019   Pneumococcal Conjugate-13 04/29/2017   Pneumococcal Polysaccharide-23 05/13/2013, 04/25/2016   Tdap 02/25/2013   Zoster, Live 08/18/2013    Conditions to be addressed/monitored:  Hypertension, Hyperlipidemia, COPD and Anxiety  There are no care plans that you recently modified to display for this patient.     Current Barriers:  Suboptimal therapeutic regimen for sleep Uncontrolled hyperlipidemia  Not yet established with new PCP   Pharmacist Clinical Goal(s):  Patient will contact provider office for questions/concerns as evidenced notation of same in electronic health record through collaboration with PharmD and provider.   Interventions: 1:1 collaboration with Jearld Fenton, NP regarding development and update of comprehensive plan of care as evidenced by provider attestation and co-signature Inter-disciplinary care team collaboration (see longitudinal plan of care) Comprehensive medication review performed; medication list updated in electronic medical record  Hypertension (BP goal <140/90) -Controlled - clinic and home readings within goal  -Current treatment: Nifedipine 30 mg - 1 tablet daily Metoprolol succinate 25 mg - 1 tablet daily Furosemide 20 mg - 1 tablet BID -Medications previously tried: none -Current home readings: none reported today -Uses digital monitor with arm cuff   -Current exercise habits: walks on treadmill on occasion  -Denies hypotensive/hypertensive symptoms -Counseled to monitor BP at home twice weekly, document, and provide log at future appointments -Reviewed refill history, all up to date except no history for nifedipine. Pt confirms adherence.  -Recommended to continue current medication  Familial Hyperlipidemia: (LDL goal < 70) -Uncontrolled - LDL 207 -Current treatment: No pharmacotherapy -Medications previously tried: statins, ezetimibe   -Educated on Cholesterol goals;  PCP recommended referral to lipid clinic. Discussed this at last visit and patient wanted to discuss this with rheumatology. Patient received email regarding PCSK-9 inhibitor information. She did not discuss with rheumatologist as planned. We discussed revisiting this issue once established with new PCP. At this time she has a lot going on with doctors appointments in the next few months.  -Patient to call if she would like to proceed with referral for lipid clinic.   Chronic Respiratory Failure (Goal: control symptoms and prevent exacerbations) -Controlled, followed by pulmonology -Current treatment  Symbicort - 2 puffs twice daily Albuterol - PRN Oxygen - wears at night and PRN during day -Medications previously tried: none reported -Exacerbations requiring treatment in last 6 months: none  -Patient reports consistent use of maintenance inhaler -Frequency of rescue inhaler use: depends on the weather, maybe twice/month  -Recommended to continue current medication; Follow with [facility] pul[facility]ology  Anxiety/Insomnia (Goal: Control symptoms) -Uncontrolled -Current treatment  Zoloft 100 mg - 1 tablet daily Diazepam 5 mg - 1 tablet daily at bedtime PRN sleep -Medications previously tried: none -Doing well on Zoloft, keeps her at ease. Diazepam PRN sleep. Reports the diazepam does not really help. Takes it about 2 days/week or less. She wakes up throughout the night with  difficulty falling back asleep. She tried melatonin before, reports ineffective. She  is interested in trying an alternative PRN sleep medication.  -Update 07/24/20 - Patient still doing okay, not ideal. She will discuss sleep alternatives once established with new PCP. -Recommended continue current medications  Patient Goals/Self-Care Activities patient will:  - check blood pressure twice weekly, document, and provide at future appointments  - call if interested in referral to lipid clinic for high cholesterol    Follow Up Plan: The care management team will reach out to the patient again over the next 90 days.  Medication Assistance: None required.  Patient affirms current coverage meets needs.  Patient's preferred pharmacy is:  [id number] [place]Phone: [phone]: [phone] [facility] [address]Phone: [phone]: [phone] Uses pill box? Yes - every Saturday  Pt endorses 100% compliance  We discussed: Benefits of medication synchronization, packaging and delivery as well as enhanced pharmacist oversight with [employer].   Patient decided to: Continue current medication management strategy  Care Plan and Follow Up Patient Decision:  Patient agrees to Care Plan and Follow-up.  MDebbora Dus PharmD Clinical Pharmacist [phone]

## 2021-04-17 ENCOUNTER — Telehealth: Payer: Self-pay

## 2021-04-17 NOTE — Telephone Encounter (Signed)
Spoke to patient Dr.Jordan advised to follow up with him in 6 months.Advised we do not have that schedule out.Call back in April to schedule July appointment.

## 2021-04-24 NOTE — Progress Notes (Signed)
Appointment has been rescheduled to 05/30/21.   Debbora Dus, CPP notified  Margaretmary Dys, [place] Pharmacy Assistant [phone]

## 2021-04-26 ENCOUNTER — Telehealth: Payer: Self-pay | Admitting: Nurse Practitioner

## 2021-04-26 ENCOUNTER — Other Ambulatory Visit: Payer: Self-pay | Admitting: Family

## 2021-04-26 NOTE — Telephone Encounter (Signed)
Noted thank you

## 2021-04-26 NOTE — Telephone Encounter (Signed)
I refilled Dana Doyle's lasix for her. I do not see that she has an appointment with me. Can we get her scheduled for a CPE after we get back to [place] please.

## 2021-04-27 DIAGNOSIS — M329 Systemic lupus erythematosus, unspecified: Secondary | ICD-10-CM | POA: Diagnosis not present

## 2021-05-04 ENCOUNTER — Telehealth: Payer: Self-pay | Admitting: Nurse Practitioner

## 2021-05-04 NOTE — Telephone Encounter (Signed)
Pt daughter called stating that pt pharmacy haven't received the medication Matt prescribed. Please advise.

## 2021-05-04 NOTE — Telephone Encounter (Signed)
I got a letter from patients pulmologist Dr. Lavonda Jumbo. States that patients daughter is concerned about the patients depression and anxiety. I had seen her last fall and wanted a follow up to see how she was doing in 2 months but I have not seen her since. Looks like she has an appointment with me in march. Her lung doctor states that they believe increasing her zoloft before adding a benzo would be helpful. Also I had started her on buspar? Is she taking it is it helpful.

## 2021-05-04 NOTE — Telephone Encounter (Signed)
Spoke with Dana Doyle-patient's daughter, and patient. Advised of all the feedback from [facility] and what the pulmonologist recommended. Dana Doyle was under impression that pulmonologist provider would ask Matt to send some benzo type of medication for the patient and that it was ok from their standpoint, she did not realize or misunderstood the part of patient trying to increase Sertraline dose first. Consulted with Matt and advised Dana Doyle that patient needs to take 100 mg 1 1/2 tablets daily for her symptoms. Patient has follow up with pulmonologist on 05/17/21 and can discuss this further with them at that point if able. Patient and Dana Doyle were not sure if patient tried or is taking Buspar that was started in November. I asked for Dana Doyle to call or send mychart letting us know if patient has been taking this.

## 2021-05-11 DIAGNOSIS — M329 Systemic lupus erythematosus, unspecified: Secondary | ICD-10-CM | POA: Diagnosis not present

## 2021-05-17 DIAGNOSIS — J849 Interstitial pulmonary disease, unspecified: Secondary | ICD-10-CM | POA: Diagnosis not present

## 2021-05-17 DIAGNOSIS — R0609 Other forms of dyspnea: Secondary | ICD-10-CM | POA: Diagnosis not present

## 2021-05-17 DIAGNOSIS — K224 Dyskinesia of esophagus: Secondary | ICD-10-CM | POA: Diagnosis not present

## 2021-05-17 DIAGNOSIS — R942 Abnormal results of pulmonary function studies: Secondary | ICD-10-CM | POA: Diagnosis not present

## 2021-05-17 DIAGNOSIS — Z7952 Long term (current) use of systemic steroids: Secondary | ICD-10-CM | POA: Diagnosis not present

## 2021-05-17 DIAGNOSIS — Z79899 Other long term (current) drug therapy: Secondary | ICD-10-CM | POA: Diagnosis not present

## 2021-05-17 DIAGNOSIS — M329 Systemic lupus erythematosus, unspecified: Secondary | ICD-10-CM | POA: Diagnosis not present

## 2021-05-17 DIAGNOSIS — F419 Anxiety disorder, unspecified: Secondary | ICD-10-CM | POA: Diagnosis not present

## 2021-05-17 DIAGNOSIS — K219 Gastro-esophageal reflux disease without esophagitis: Secondary | ICD-10-CM | POA: Diagnosis not present

## 2021-05-18 ENCOUNTER — Other Ambulatory Visit (HOSPITAL_COMMUNITY): Payer: Self-pay

## 2021-05-21 ENCOUNTER — Encounter: Payer: Self-pay | Admitting: Cardiology

## 2021-05-21 MED ORDER — POTASSIUM CHLORIDE CRYS ER 20 MEQ PO TBCR: 40.0000 meq | EXTENDED_RELEASE_TABLET | Freq: Every day | ORAL | 3 refills | Status: DC

## 2021-05-22 DIAGNOSIS — H903 Sensorineural hearing loss, bilateral: Secondary | ICD-10-CM | POA: Diagnosis not present

## 2021-05-22 DIAGNOSIS — J301 Allergic rhinitis due to pollen: Secondary | ICD-10-CM | POA: Diagnosis not present

## 2021-05-22 DIAGNOSIS — H6061 Unspecified chronic otitis externa, right ear: Secondary | ICD-10-CM | POA: Diagnosis not present

## 2021-05-22 DIAGNOSIS — H6121 Impacted cerumen, right ear: Secondary | ICD-10-CM | POA: Diagnosis not present

## 2021-05-30 ENCOUNTER — Ambulatory Visit (INDEPENDENT_AMBULATORY_CARE_PROVIDER_SITE_OTHER): Payer: Medicare HMO | Admitting: Pharmacist

## 2021-05-30 ENCOUNTER — Other Ambulatory Visit: Payer: Self-pay

## 2021-05-30 ENCOUNTER — Telehealth: Payer: Self-pay | Admitting: Pharmacist

## 2021-05-30 ENCOUNTER — Other Ambulatory Visit: Payer: Self-pay | Admitting: Nurse Practitioner

## 2021-05-30 DIAGNOSIS — Z79899 Other long term (current) drug therapy: Secondary | ICD-10-CM | POA: Diagnosis not present

## 2021-05-30 DIAGNOSIS — E7800 Pure hypercholesterolemia, unspecified: Secondary | ICD-10-CM

## 2021-05-30 DIAGNOSIS — M329 Systemic lupus erythematosus, unspecified: Secondary | ICD-10-CM | POA: Diagnosis not present

## 2021-05-30 DIAGNOSIS — J849 Interstitial pulmonary disease, unspecified: Secondary | ICD-10-CM | POA: Diagnosis not present

## 2021-05-30 DIAGNOSIS — F419 Anxiety disorder, unspecified: Secondary | ICD-10-CM

## 2021-05-30 DIAGNOSIS — J984 Other disorders of lung: Secondary | ICD-10-CM | POA: Diagnosis not present

## 2021-05-30 DIAGNOSIS — I10 Essential (primary) hypertension: Secondary | ICD-10-CM

## 2021-05-30 DIAGNOSIS — J9610 Chronic respiratory failure, unspecified whether with hypoxia or hypercapnia: Secondary | ICD-10-CM

## 2021-05-30 NOTE — Telephone Encounter (Signed)
I placed a new referral in case one was neededf ?

## 2021-05-30 NOTE — Telephone Encounter (Signed)
Spoke with patient today regarding cholesterol control, she has not tolerated statins or ezetimibe and would benefit from PCKS9-inhibitor. She was referred to lipid clinic in 10/2020, but never made appt due to being busy with other things. Per chart review, referral was closed 03/19/21. ? ?Today patient states she would like to pursue lipid clinic appt now that things have settled down for her.  ? ?Lipid Panel  ?   ?Component Value Date/Time  ? CHOL 294 (H) 11/20/2020 1245  ? TRIG 143.0 11/20/2020 1245  ? HDL 66.50 11/20/2020 1245  ? CHOLHDL 4 11/20/2020 1245  ? VLDL 28.6 11/20/2020 1245  ? LDLCALC 199 (H) 11/20/2020 1245  ? LDLDIRECT 207.0 04/25/2020 1132  ?  ?

## 2021-05-30 NOTE — Progress Notes (Cosign Needed)
Chronic Care Management Pharmacy Note  05/30/2021 Name:  Dana Doyle MRN:  [id number] DOB:  1951/01/30  Summary: CCM F/U visit -LDL 199 (10/2020), pt has not tolerated statins or ezetimibe and she had been referred to lipid clinic but never made appt due to other issues coming up. She is now willing to make appt with lipid clinic, she is excellent candidate for PCSK9-inhibitor  Recommendations/Changes made from today's visit: -Renew referral to lipid clinic; consider PCSK9-inhibitor  Plan: -[facility] will call patient 1 month re: lipid clinic appt scheduled -Pharmacist follow up televisit scheduled for 6 months -PCP F/U appt due (6 months recommended 10/2020)    Subjective: Dana Doyle is an 71 y.o. year old female who is a primary patient of Dana Pitcher, NP.  The CCM team was consulted for assistance with disease management and care coordination needs.    Engaged with patient by telephone for follow up visit in response to provider referral for pharmacy case management and/or care coordination services.   Consent to Services:  The patient was given information about Chronic Care Management services, agreed to services, and gave verbal consent prior to initiation of services.  Please see initial visit note for detailed documentation.   Patient Care Team: Dana Pitcher, NP as PCP - General Lenord Fellers Cleaster Corin, [facility] as Pharmacist (Pharmacist)  Recent office visits: 02/19/21 NP Romilda Garret OV: hospital f/u; c/o thrush. Ordered Nystatin suspension. C/o ear infection, ordered Ofloxacin ear drops. C/o anxiety, pt asking about diazepam, PCP advised to avoid benzo d/t recent respiratory failure. Rx'd Buspar BID. D/c mirtazapine.  Recent consult visits: 05/17/21 Dr Lavonda Jumbo [facility]): ILD, DOE. Continue Symbicort, Bactrim for infection prevention, and prednisone 10 mg daily. Start Klonopin 0.5 mg BID PRN. 05/11/21 Infusion - Rituxan (Lupus)  03/28/21  Cardiology message: increase potassium to 2 tablets daily (K low)  03/19/21 Dr Martinique (cardiology): consult for ILD, DOE. Order R Heart cath to rule out pulmonary HTN.  01/02/21 NP Spaulding ([facility]): f/u SLE. Refilled prednisone 5 mg daily, Plaquenil 400 mg daily.  Hospital visits: 03/23/21 Admission for R heart cath.  Medication Reconciliation was completed by comparing discharge summary, patients EMR and Pharmacy list, and upon discussion with patient.  Admitted to the hospital on 01/29/21 due to Acute respiratory failure (COVID). Discharge date was 02/05/21. Discharged from [facility].    Medication Changes at Hospital Discharge: -Changed prednisone to higher dose taper, then return to home dose  Medications that remain the same after Hospital Discharge:??  -All other medications will remain the same.      Objective:  Lab Results  Component Value Date   CREATININE 1.17 (H) 03/23/2021   BUN 11 03/23/2021   GFR 51.46 (L) 02/19/2021   EGFR 61 03/19/2021   GFRNONAA 51 (L) 03/23/2021   NA 139 03/23/2021   NA 138 03/23/2021   K 3.3 (L) 03/23/2021   K 3.4 (L) 03/23/2021   CALCIUM 8.8 (L) 03/23/2021   CO2 28 03/23/2021   GLUCOSE 98 03/23/2021    Lab Results  Component Value Date/Time   HGBA1C 5.5 11/20/2020 12:45 PM   HGBA1C 5.3 04/25/2020 11:32 AM   GFR 51.46 (L) 02/19/2021 03:44 PM   GFR 53.90 (L) 11/20/2020 12:45 PM    Last diabetic Eye exam: No results found for: HMDIABEYEEXA  Last diabetic Foot exam: No results found for: HMDIABFOOTEX   Lab Results  Component Value Date   CHOL 294 (H) 11/20/2020   HDL  66.50 11/20/2020   LDLCALC 199 (H) 11/20/2020   LDLDIRECT 207.0 04/25/2020   TRIG 143.0 11/20/2020   CHOLHDL 4 11/20/2020    Hepatic Function Latest Ref Rng & Units 02/19/2021 02/04/2021 02/03/2021  Total Protein 6.0 - 8.3 g/dL 6.5 4.7(L) 5.3(L)  Albumin 3.5 - 5.2 g/dL 3.9 2.6(L) 2.9(L)  AST 0 - 37 U/L 19 17 18   ALT 0 - 35 U/L 16 14 11    Alk Phosphatase 39 - 117 U/L 81 62 69  Total Bilirubin 0.2 - 1.2 mg/dL 0.7 0.3 0.6    Lab Results  Component Value Date/Time   TSH 2.49 11/20/2020 12:45 PM   TSH 4.78 (H) 04/25/2020 11:32 AM   FREET4 0.61 11/20/2020 12:45 PM   FREET4 0.74 04/25/2020 11:32 AM    CBC Latest Ref Rng & Units 03/23/2021 03/23/2021 03/19/2021  WBC 3.4 - 10.8 x10E3/uL - - 10.2  Hemoglobin 12.0 - 15.0 g/dL 11.6(L) 11.6(L) 12.9  Hematocrit 36.0 - 46.0 % 34.0(L) 34.0(L) 37.9  Platelets 150 - 450 x10E3/uL - - 278    Lab Results  Component Value Date/Time   VD25OH 85 10/20/2012 12:45 PM    Clinical ASCVD: No  The 10-year ASCVD risk score (Arnett DK, et al., 2019) is: 17.4%   Values used to calculate the score:     Age: 23 years     Sex: Female     Is Non-Hispanic African American: No     Diabetic: Yes     Tobacco smoker: No     Systolic Blood Pressure: 580 mmHg     Is BP treated: Yes     HDL Cholesterol: 66.5 mg/dL     Total Cholesterol: 294 mg/dL    Depression screen [facility] 2/9 04/25/2020  Decreased Interest 0  Down, Depressed, Hopeless 0  PHQ - 2 Score 0      Social History   Tobacco Use  Smoking Status Never  Smokeless Tobacco Never   BP Readings from Last 3 Encounters:  03/23/21 (!) 113/58  03/19/21 102/72  02/19/21 106/60   Pulse Readings from Last 3 Encounters:  03/23/21 (!) 59  03/19/21 81  02/19/21 81   Wt Readings from Last 3 Encounters:  03/23/21 183 lb (83 kg)  03/19/21 184 lb 3.2 oz (83.6 kg)  02/19/21 180 lb 6 oz (81.8 kg)   BMI Readings from Last 3 Encounters:  03/23/21 32.42 kg/m  03/19/21 32.63 kg/m  02/19/21 32.47 kg/m    Assessment/Interventions: Review of patient past medical history, allergies, medications, health status, including review of consultants reports, laboratory and other test data, was performed as part of comprehensive evaluation and provision of chronic care management services.   SDOH:  (Social Determinants of Health) assessments and  interventions performed: No  SDOH Screenings   Alcohol Screen: Not on file  Depression (PHQ2-9): Not on file  Financial Resource Strain: Low Risk    Difficulty of Paying Living Expenses: Not very hard  Food Insecurity: Not on file  Housing: Not on file  Physical Activity: Not on file  Social Connections: Not on file  Stress: Not on file  Tobacco Use: Low Risk    Smoking Tobacco Use: Never   Smokeless Tobacco Use: Never   Passive Exposure: Not on file  Transportation Needs: Not on file    CCM Care Plan  Allergies  Allergen Reactions   Belimumab Nausea And Vomiting and Other (See Comments)    (Benlysta)   Mycophenolate Mofetil Other (See Comments) and Nausea And Vomiting  Phenergan [Promethazine Hcl] Anaphylaxis   Codeine Hives, Itching and Other (See Comments)   Meloxicam Hives   Statins Other (See Comments)    Muscle spasm and cramps   Ezetimibe Other (See Comments)    Muscle spasms   Hydroxyzine Other (See Comments)    Severe nightmares   Tape     Other reaction(s): Other (See Comments) Very thin skin due to chronic steroid use     Medications Reviewed Today     Reviewed by Charlton Haws, [facility] (Pharmacist) on 05/30/21 at 1244  Med List Status: <None>   Medication Order Taking? Sig Documenting Provider Last Dose Status Informant  albuterol (VENTOLIN HFA) 108 (90 Base) MCG/ACT inhaler [id number] Yes Inhale 1-2 puffs into the lungs every 6 (six) hours as needed for wheezing or shortness of breath. Dana Pitcher, NP Taking Active Self  budesonide-formoterol [facility]) 160-4.5 MCG/ACT inhaler [id number] Yes Inhale 2 puffs into the lungs in the morning and at bedtime. [provider] Taking Active Self  busPIRone (BUSPAR) 5 MG tablet [id number] Yes Take 1 tablet (5 mg total) by mouth 2 (two) times daily.  Patient taking differently: Take 5 mg by mouth at bedtime as needed (sleep).   Dana Pitcher, NP Taking Active Self  Cholecalciferol (VITAMIN D3) 250  MCG (10000 UT) capsule [id number] Yes Take 10,000 Units by mouth daily. [provider] Taking Active Self  furosemide (LASIX) 20 MG tablet [id number] Yes TAKE 1 TABLET BY MOUTH TWICE A DAY Dana Pitcher, NP Taking Active   hydroxychloroquine (PLAQUENIL) 200 MG tablet [id number] Yes Take 400 mg by mouth daily. [provider] Taking Active Self  KRILL OIL PO [id number] Yes Take 1 capsule by mouth daily in the afternoon. [provider] Taking Active Self  levothyroxine (SYNTHROID) 50 MCG tablet [id number] Yes TAKE 1 TABLET BY MOUTH EVERY DAY BEFORE BREAKFAST  Patient taking differently: Take 50 mcg by mouth daily before breakfast.   Dana Pitcher, NP Taking Active Self  metoprolol succinate (TOPROL-XL) 25 MG 24 hr tablet [id number] Yes TAKE 1 TABLET (25 MG TOTAL) BY MOUTH DAILY. Jearld Fenton, NP Taking Active Self  ondansetron (ZOFRAN) 4 MG tablet [id number] Yes Take 1 tablet (4 mg total) by mouth every 8 (eight) hours as needed for nausea or vomiting. Dana Pitcher, NP Taking Active Self           Med Note Jeanie Cooks Mar 21, 2021  9:27 AM) On hand  OXYGEN [id number] Yes Inhale 2.5-3 L into the lungs as needed. [provider] Taking Active Self  pantoprazole (PROTONIX) 40 MG tablet [id number] Yes TAKE 1 TABLET BY MOUTH EVERY DAY Dana Pitcher, NP Taking Active Self  potassium chloride SA (KLOR-CON M) 20 MEQ tablet [id number] Yes Take 2 tablets (40 mEq total) by mouth daily. Martinique, Peter M, MD Taking Active   predniSONE (DELTASONE) 10 MG tablet [id number] Yes Take 10 mg by mouth daily. [provider] Taking Active Self  riTUXimab (RITUXAN IV) [id number] Yes Inject into the vein every 6 (six) months. [provider] Taking Active Self  sertraline (ZOLOFT) 100 MG tablet [id number] Yes TAKE 1 TABLET BY MOUTH EVERY DAY  Patient taking differently: Take 100 mg by mouth daily.   Dutch Quint B, FNP Taking Active Self   sulfamethoxazole-trimethoprim (BACTRIM DS) 800-160 MG tablet [id number] Yes Take 1 tablet by mouth every Monday, Wednesday, and Friday. 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 ([place]) 03/23/2021   Otitis externa 2021-03-20   Thrush 2021-03-20   Disappearance and death of family member 03/20/2021   Dyspnea on exertion 2021/03/20   Acute respiratory failure due to COVID-19 [facility]) 01/30/2021   Dizziness 11/20/2020   Anxiety 10/07/2019   GERD (gastroesophageal reflux disease) 10/07/2019   SLE (systemic lupus erythematosus related syndrome) ([place]) 10/07/2019   Chronic respiratory failure ([place]) 10/07/2019   Hypercholesterolemia 10/30/2012   DJD (degenerative joint disease) 10/20/2012   HTN (hypertension) 10/20/2012   DM2 (diabetes mellitus, type 2) ([place]) 10/20/2012   Hypothyroidism 10/20/2012   Irritable bowel syndrome 10/20/2012    Immunization History  Administered Date(s) Administered   Influenza, High Dose Seasonal PF 01/27/2020   PFIZER(Purple Top)SARS-COV-2 Vaccination 11/18/2019, 12/09/2019   Pneumococcal Conjugate-13 04/29/2017   Pneumococcal Polysaccharide-23 05/13/2013, 04/25/2016   Tdap 02/25/2013   Zoster, Live 08/18/2013    Conditions to be addressed/monitored:  Hypertension, Hyperlipidemia, and Anxiety, Chronic respiratory failure  Care Plan : [place]  Updates made by Charlton Haws, [place] since 05/30/2021 12:00 AM     Problem: Hypertension, Hyperlipidemia, and Anxiety, Chronic respiratory failure   Priority: High     Long-Range Goal: Disease mgmt   Start Date: 05/30/2021  Expected End Date: 05/31/2022  This Visit's Progress: On track  Priority: High  Note:   Current Barriers:  Unable to independently monitor therapeutic efficacy Unable to achieve control of hyperlipidemia   Pharmacist Clinical Goal(s):  Patient  will achieve adherence to monitoring guidelines and medication adherence to achieve therapeutic efficacy achieve control of hyperlipidemia as evidenced by labwork through collaboration with PharmD and provider.   Interventions: 1:1 collaboration with Jearld Fenton, NP regarding development and update of comprehensive plan of care as evidenced by provider attestation and co-signature Inter-disciplinary care team collaboration (see longitudinal plan of care) Comprehensive medication review performed; medication list updated in electronic medical record  Hypertension (BP goal <140/90) -Controlled - clinic and home readings within goal  -Current home BP readings: none available (pt was in car) -Current treatment: Metoprolol succinate 25 mg daily - Query Appropriate, Effective, Safe, Accessible Furosemide 20 mg BID - Appropriate, Effective, Safe, Accessible -Medications previously tried: nifedipine, lisinopril/HCTZ, quinapril -Counseled to monitor BP at home twice weekly -Recommended to continue current medication  Familial Hyperlipidemia: (LDL goal < 70) -Uncontrolled - LDL 199 (10/2020); pt reports long history of elevated cholesterol since early adulthood, we discussed this is indicative of familial/genetic component; she has not tolerated statins or ezetimibe in the past; she was referred to lipid clinic 10/2020 but never had appt due to being busy with other things, she is now open to scheduling an appt -Current treatment: No pharmacotherapy -Medications previously tried: statins, ezetimibe   -Educated on Cholesterol goals; discussed benefits of PCSK9-inhibitor particularly for familial hypercholesterolemia; pt requested email with more info about PCSK9-inhibitors and would like to set up appt with lipid clinic -Will follow up on referral to lipid clinic - unclear if new referral is needed  Chronic Respiratory Failure (Goal: control symptoms and prevent exacerbations) -Controlled - per pt  report; Followed by pulmonology for ILD/lupus -Current treatment  Symbicort 2 puffs twice daily - Appropriate, Effective, Safe, Accessible Albuterol Prn - Appropriate, Effective, Safe, Accessible Oxygen - wears at night and PRN during day -Medications previously tried: none reported -Exacerbations requiring treatment in last 6 months: none  -Patient reports consistent use of  maintenance inhaler -Frequency of rescue inhaler use: depends on the weather, maybe twice/month  -Recommended to continue current medication; Follow with [facility]  Anxiety/Insomnia (Goal: Control symptoms) -Improved - pt endorses compliance with meds; pt has requested alternative sleep medications before but did not request this today -PHQ9: 0 (04/2020) - minimal depression -GAD7: 2 (04/2020) - minimal anxiety -Follows with PCP and NP Dutch Quint for mental health support -Current treatment  Sertraline 100 mg daily - Appropriate, Effective, Safe, Accessible Buspirone 5 mg HS prn -Medications previously tried: none -Recommended continue current medications  Patient Goals/Self-Care Activities Patient will:  - take medications as prescribed as evidenced by patient report and record review focus on medication adherence by routine check blood pressure 2x weekly, document, and provide at future appointments -Make appt with lipid clinic       Medication Assistance: None required.  Patient affirms current coverage meets needs.  Compliance/Adherence/Medication fill history: Care Gaps: Foot exam Eye exam Urine microalbumin Mammogram (due 10/29/14) DEXA scan   Star-Rating Drugs: None  Patient's preferred pharmacy is:  [id number] [place], [place] [address]Phone: [phone]: [phone] [facility] [address]Phone: [phone]: [phone] Uses pill box? Yes Pt  endorses 100% compliance  We discussed: Current pharmacy is preferred with insurance plan and patient is satisfied with pharmacy services Patient decided to: Continue current medication management strategy  Care Plan and Follow Up Patient Decision:  Patient agrees to Care Plan and Follow-up.  Plan: Telephone follow up appointment with care management team member scheduled for:  6 months  LCharlene Brooke PharmD, BCACP Clinical Pharmacist [phone]

## 2021-05-30 NOTE — Patient Instructions (Signed)
Visit Information ? ?Phone number for Pharmacist: [phone] ? ? Goals Addressed   ? ?  ?  ?  ?  ? This Visit's Progress  ?  Manage My Medicine     ? ?  ? ? ?Care Plan : [place]  ?Updates made by Charlton Haws, RPH since 05/30/2021 12:00 AM  ?  ? ?Problem: Hypertension, Hyperlipidemia, and Anxiety, Chronic respiratory failure   ?Priority: High  ?  ? ?Long-Range Goal: Disease mgmt   ?Start Date: 05/30/2021  ?Expected End Date: 05/31/2022  ?This Visit's Progress: On track  ?Priority: High  ?Note:   ?Current Barriers:  ?Unable to independently monitor therapeutic efficacy ?Unable to achieve control of hyperlipidemia  ? ?Pharmacist Clinical Goal(s):  ?Patient will achieve adherence to monitoring guidelines and medication adherence to achieve therapeutic efficacy ?achieve control of hyperlipidemia as evidenced by labwork through collaboration with PharmD and provider.  ? ?Interventions: ?1:1 collaboration with Jearld Fenton, NP regarding development and update of comprehensive plan of care as evidenced by provider attestation and co-signature ?Inter-disciplinary care team collaboration (see longitudinal plan of care) ?Comprehensive medication review performed; medication list updated in electronic medical record ? ?Hypertension (BP goal <140/90) ?-Controlled - clinic and home readings within goal  ?-Current home BP readings: none available (pt was in car) ?-Current treatment: ?Metoprolol succinate 25 mg daily - Query Appropriate, Effective, Safe, Accessible ?Furosemide 20 mg BID - Appropriate, Effective, Safe, Accessible ?-Medications previously tried: nifedipine, lisinopril/HCTZ, quinapril ?-Counseled to monitor BP at home twice weekly ?-Recommended to continue current medication ? ?Familial Hyperlipidemia: (LDL goal < 70) ?-Uncontrolled - LDL 199 (10/2020); pt reports long history of elevated cholesterol since early adulthood, we discussed this is indicative of familial/genetic component; she has not  tolerated statins or ezetimibe in the past; she was referred to lipid clinic 10/2020 but never had appt due to being busy with other things, she is now open to scheduling an appt ?-Current treatment: ?No pharmacotherapy ?-Medications previously tried: statins, ezetimibe   ?-Educated on Cholesterol goals; discussed benefits of PCSK9-inhibitor particularly for familial hypercholesterolemia; pt requested email with more info about PCSK9-inhibitors and would like to set up appt with lipid clinic ?-Will follow up on referral to lipid clinic - unclear if new referral is needed ? ?Chronic Respiratory Failure (Goal: control symptoms and prevent exacerbations) ?-Controlled - per pt report; Followed by pulmonology for ILD/lupus ?-Current treatment  ?Symbicort 2 puffs twice daily - Appropriate, Effective, Safe, Accessible ?Albuterol Prn - Appropriate, Effective, Safe, Accessible ?Oxygen - wears at night and PRN during day ?-Medications previously tried: none reported ?-Exacerbations requiring treatment in last 6 months: none  ?-Patient reports consistent use of maintenance inhaler ?-Frequency of rescue inhaler use: depends on the weather, maybe twice/month  ?-Recommended to continue current medication; Follow with [facility] ? ?Anxiety/Insomnia (Goal: Control symptoms) ?-Improved - pt endorses compliance with meds; pt has requested alternative sleep medications before but did not request this today ?-PHQ9: 0 (04/2020) - minimal depression ?-GAD7: 2 (04/2020) - minimal anxiety ?-Follows with PCP and NP Dutch Quint for mental health support ?-Current treatment  ?Sertraline 100 mg daily - Appropriate, Effective, Safe, Accessible ?Buspirone 5 mg HS prn ?-Medications previously tried: none ?-Recommended continue current medications ? ?Patient Goals/Self-Care Activities ?Patient will:  ?- take medications as prescribed as evidenced by patient report and record review ?focus on medication adherence by routine ?check blood  pressure 2x weekly, document, and provide at future appointments ?-Make appt with lipid clinic ? ?  ?  ? ?  Patient verbalizes understanding of instructions and care plan provided today and agrees to view in [place]. Active MyChart status confirmed with patient.   ?Telephone follow up appointment with pharmacy team member scheduled for: 6 months ? ?Charlene Brooke, PharmD, BCACP ?Clinical Pharmacist ?[facility] ?[phone] ?  ?

## 2021-06-01 ENCOUNTER — Encounter: Payer: Medicare HMO | Admitting: Nurse Practitioner

## 2021-06-29 DIAGNOSIS — I10 Essential (primary) hypertension: Secondary | ICD-10-CM

## 2021-06-29 DIAGNOSIS — J9610 Chronic respiratory failure, unspecified whether with hypoxia or hypercapnia: Secondary | ICD-10-CM

## 2021-06-29 DIAGNOSIS — E7849 Other hyperlipidemia: Secondary | ICD-10-CM

## 2021-07-02 ENCOUNTER — Telehealth: Payer: Self-pay

## 2021-07-02 NOTE — Chronic Care Management (AMB) (Signed)
? ? ?Chronic Care Management ?Pharmacy Assistant  ? ?Name: Dana Doyle  MRN: [id number] DOB: Feb 10, 1951 ? ? ?Reason for Encounter: General Adherence  ?  ?Recent office visits:  ?None since last CCM contact ? ?Recent consult visits:  ?05/30/21-Rheumatology-Stephanie Spaulding,NP-follow up Lupus-Consider decreasing her dosing to just 500 mg IV each infusion with her next round if she is still doing well,labs ordered( no MD comments),no medication changes ?04/27/21-[facility]- infusion therapy  ? ?Hospital visits:  ?None in previous 6 months ? ?Medications: ?Outpatient Encounter Medications as of 07/02/2021  ?Medication Sig Note  ? albuterol (VENTOLIN HFA) 108 (90 Base) MCG/ACT inhaler Inhale 1-2 puffs into the lungs every 6 (six) hours as needed for wheezing or shortness of breath.   ? budesonide-formoterol (SYMBICORT) 160-4.5 MCG/ACT inhaler Inhale 2 puffs into the lungs in the morning and at bedtime.   ? busPIRone (BUSPAR) 5 MG tablet Take 1 tablet (5 mg total) by mouth 2 (two) times daily. (Patient taking differently: Take 5 mg by mouth at bedtime as needed (sleep).)   ? Cholecalciferol (VITAMIN D3) 250 MCG (10000 UT) capsule Take 10,000 Units by mouth daily.   ? furosemide (LASIX) 20 MG tablet TAKE 1 TABLET BY MOUTH TWICE A DAY   ? hydroxychloroquine (PLAQUENIL) 200 MG tablet Take 400 mg by mouth daily.   ? KRILL OIL PO Take 1 capsule by mouth daily in the afternoon.   ? levothyroxine (SYNTHROID) 50 MCG tablet TAKE 1 TABLET BY MOUTH EVERY DAY BEFORE BREAKFAST (Patient taking differently: Take 50 mcg by mouth daily before breakfast.)   ? metoprolol succinate (TOPROL-XL) 25 MG 24 hr tablet TAKE 1 TABLET (25 MG TOTAL) BY MOUTH DAILY.   ? ondansetron (ZOFRAN) 4 MG tablet Take 1 tablet (4 mg total) by mouth every 8 (eight) hours as needed for nausea or vomiting. 03/21/2021: On hand  ? OXYGEN Inhale 2.5-3 L into the lungs as needed.   ? pantoprazole (PROTONIX) 40 MG tablet TAKE 1 TABLET BY MOUTH EVERY DAY   ?  potassium chloride SA (KLOR-CON M) 20 MEQ tablet Take 2 tablets (40 mEq total) by mouth daily.   ? predniSONE (DELTASONE) 10 MG tablet Take 10 mg by mouth daily.   ? riTUXimab (RITUXAN IV) Inject into the vein every 6 (six) months.   ? sertraline (ZOLOFT) 100 MG tablet TAKE 1 TABLET BY MOUTH EVERY DAY (Patient taking differently: Take 100 mg by mouth daily.)   ? sulfamethoxazole-trimethoprim (BACTRIM DS) 800-160 MG tablet Take 1 tablet by mouth every Monday, Wednesday, and Friday.   ? ?No facility-administered encounter medications on file as of 07/02/2021.  ? ? ? ? ?Contacted [name] on 07/02/21 for general disease state and medication adherence call.  ? ?Patient is not more than 5 days past due for refill on the following medications per chart history: ? ?Star Medications: ?Medication Name/mg Last Fill Days Supply ?No star medications identified ? ?Furosemide    04/26/21 90 ? ?What concerns do you have about your medications? No concerns at this time, patient reports she is feeling the best she has for a very long time, medications are working well for her  ? ?The patient denies side effects with their medications.  ? ?How often do you forget or accidentally miss a dose? Never ? ?Do you use a pillbox? Yes ? ?Are you having any problems getting your medications from your pharmacy? No ? ?Has the cost of your medications been a concern? No ? ? ?Since last  visit with CPP, no interventions have been made. The patient has lost the phone number for Lipid clinic that she needed to call. I will try and get it from the CPP and call her with the number.  The medication Metoprolol succinate 25 mg daily  has been stopped due to BP within goal. ? ?The patient has not had an ED visit since last contact.  ? ?The patient denies problems with their health.  ? ?Patient denies concerns or questions for Charlene Brooke, PharmD at this time.  ? ?Counseled patient on:  ?[place] job taking medications, Importance of taking  medication daily without missed doses, Benefits of adherence packaging or a pillbox, and Access to CCM team for any cost, medication or pharmacy concerns. ? ? ?Care Gaps: ?Annual wellness visit in last year? No ?Most Recent BP reading:102/72  81-P  03/19/21 ? ? ?Upcoming appointments: ?No appointments scheduled within the next 30 days. ? ? ?Charlene Brooke, CPP notified ? ?Darnelle Derrick, CCMA ?Health concierge  ?[phone]  ? ? ? ?  ?

## 2021-07-21 ENCOUNTER — Emergency Department: Payer: Medicare HMO

## 2021-07-21 ENCOUNTER — Other Ambulatory Visit: Payer: Self-pay

## 2021-07-21 ENCOUNTER — Emergency Department
Admission: EM | Admit: 2021-07-21 | Discharge: 2021-07-21 | Disposition: A | Discharge status: Home / Self Care | Payer: Medicare HMO | Attending: Emergency Medicine | Admitting: Emergency Medicine

## 2021-07-21 DIAGNOSIS — S8001XA Contusion of right knee, initial encounter: Secondary | ICD-10-CM | POA: Diagnosis not present

## 2021-07-21 DIAGNOSIS — E119 Type 2 diabetes mellitus without complications: Secondary | ICD-10-CM | POA: Diagnosis not present

## 2021-07-21 DIAGNOSIS — S01511A Laceration without foreign body of lip, initial encounter: Secondary | ICD-10-CM

## 2021-07-21 DIAGNOSIS — Z981 Arthrodesis status: Secondary | ICD-10-CM | POA: Diagnosis not present

## 2021-07-21 DIAGNOSIS — M25551 Pain in right hip: Secondary | ICD-10-CM | POA: Diagnosis not present

## 2021-07-21 DIAGNOSIS — S8002XA Contusion of left knee, initial encounter: Secondary | ICD-10-CM

## 2021-07-21 DIAGNOSIS — S199XXA Unspecified injury of neck, initial encounter: Secondary | ICD-10-CM | POA: Diagnosis not present

## 2021-07-21 DIAGNOSIS — S0990XA Unspecified injury of head, initial encounter: Secondary | ICD-10-CM | POA: Diagnosis not present

## 2021-07-21 DIAGNOSIS — W01198A Fall on same level from slipping, tripping and stumbling with subsequent striking against other object, initial encounter: Secondary | ICD-10-CM | POA: Insufficient documentation

## 2021-07-21 DIAGNOSIS — Y92513 Shop (commercial) as the place of occurrence of the external cause: Secondary | ICD-10-CM | POA: Insufficient documentation

## 2021-07-21 DIAGNOSIS — S7001XA Contusion of right hip, initial encounter: Secondary | ICD-10-CM

## 2021-07-21 DIAGNOSIS — I10 Essential (primary) hypertension: Secondary | ICD-10-CM | POA: Diagnosis not present

## 2021-07-21 DIAGNOSIS — M25562 Pain in left knee: Secondary | ICD-10-CM | POA: Diagnosis not present

## 2021-07-21 DIAGNOSIS — W19XXXA Unspecified fall, initial encounter: Secondary | ICD-10-CM

## 2021-07-21 DIAGNOSIS — Z96651 Presence of right artificial knee joint: Secondary | ICD-10-CM | POA: Diagnosis not present

## 2021-07-21 DIAGNOSIS — R918 Other nonspecific abnormal finding of lung field: Secondary | ICD-10-CM | POA: Diagnosis not present

## 2021-07-21 DIAGNOSIS — Z043 Encounter for examination and observation following other accident: Secondary | ICD-10-CM | POA: Diagnosis not present

## 2021-07-21 DIAGNOSIS — M25561 Pain in right knee: Secondary | ICD-10-CM | POA: Diagnosis not present

## 2021-07-21 DIAGNOSIS — S0993XA Unspecified injury of face, initial encounter: Secondary | ICD-10-CM | POA: Diagnosis not present

## 2021-07-21 MED ORDER — PREDNISONE 10 MG PO TABS: 10.0000 mg | ORAL_TABLET | ORAL | 0 refills | Status: AC

## 2021-07-21 MED ORDER — METHOCARBAMOL 500 MG PO TABS: 500.0000 mg | ORAL_TABLET | Freq: Four times a day (QID) | ORAL | 0 refills | Status: DC

## 2021-07-21 NOTE — ED Triage Notes (Signed)
FIRST NURSE NOTE:  Pt arrived via POV with reports of fall at [facility] today, pt has lacerations to lip and mouth, family also reports increased weakness with ambulation and has large bruised area to R hip.  Pt has hx of bilateral knee replacement and is complaining of knee pain.  ?

## 2021-07-21 NOTE — ED Triage Notes (Signed)
Fall at [employer] today. Unsteady at baseline. Small laceration on right lower lip. R hip and bilateral knee pain. Denies hitting head or LOC.  ?

## 2021-07-21 NOTE — ED Notes (Signed)
Pt noted to have large hematoma to right lower hip/upper leg.  Pt also noted with some bruises to right shin area and BIL knees.  Pt has two lacerations noted to lower lip with bleeding controlled. One lac to inner lip and one to outer lip, but not through and through.   ?

## 2021-07-21 NOTE — ED Provider Notes (Signed)
? ?[facility] ?Provider Note ? ?Patient Contact: 9:15 PM (approximate) ? ? ?History  ? ?Fall ? ? ?HPI ? ?Dana Doyle is a 71 y.o. female who presents to the emergency department after mechanical fall.  Patient states that she was at [facility], there was a crate of watermelons with an exposed corner.  Patient's foot caught the corner of the crate causing her to fall forward.  Patient landed on both of her knees and then fell onto the right side.  She did hit her head on a nearby table as she fell.  She sustained a laceration to the external right lip as well as internal right lip.  She does not believe they communicate.  She denies any headache, visual changes but endorses significant pain to bilateral knees and right hip.  Patient states that she has a large "knot" over the hip itself.  She was able to stand immediately after the incident but moving the right lower extremity causes pain in the hip.  No shortening or rotation of the hip was reported.  Patient denies blood thinner use but states that she bleeds easily and sustains bruises very easily.  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 ?  ? ? ?Physical Exam  ? ?Triage Vital Signs: ?ED Triage Vitals [07/21/21 1958]  ?Enc Vitals Group  ?   BP (!) 166/75  ?   Pulse Rate 66  ?   Resp 17  ?   Temp 97.7 ?F (36.5 ?C)  ?   Temp src   ?   SpO2 96 %  ?   Weight 178 lb (80.7 kg)  ?   Height '5\' 3"'$  (1.6 m)  ?   Head Circumference   ?   Peak Flow   ?   Pain Score 7  ?   Pain Loc   ?   Pain Edu?   ?   Excl. in [place]?   ? ? ?Most recent vital signs: ?Vitals:  ? 07/21/21 1958  ?BP: (!) 166/75  ?Pulse: 66  ?Resp: 17  ?Temp: 97.7 ?F (36.5 ?C)  ?SpO2: 96%  ? ? ? ?General: Alert and in no acute distress. ?Head: Superficial laceration noted to the right lower lip.  This is along the external surface below the vermilion border.  Well approximated and scabbed over at this time.  Again  relatively superficial.  Patient has a small 0.25 cm laceration to the inner lip.  Again this does not communicate with the external surface.  Does not create a flap.  There is no loose dentition and no tenderness to the dentition.  No other significant signs of head injury with ecchymosis, other lacerations.  No tenderness to the facial structures or osseous structures.  No battle signs, raccoon eyes, serosanguineous fluid drainage from the ears or nares. ?Neck: No stridor. No cervical spine tenderness to palpation.  ?Cardiovascular:  Good peripheral perfusion ?Respiratory: Normal respiratory effort without tachypnea or retractions. Lungs CTAB.  ?Musculoskeletal: Full range of motion to all extremities.  Visualization of bilateral knees reveals previous knee replacement scars.  These are old.  Patient does have some ecchymosis around the patella extending into the tibial tuberosity bilaterally.  No open wounds.  Good range of motion currently.  Global tenderness bilateral.  Palpation of the right hip reveals tenderness over the proximal femur region.  There is a large hematoma in this area.  No shortening or rotation of the right  lower extremity.  There is no tenderness over the lumbar spine, iliac crest, pubic rami region.  Dorsalis pedis pulses sensation intact distally. ?Neurologic:  No gross focal neurologic deficits are appreciated.  ?Skin:   No rash noted ?Other: ? ? ?ED Results / Procedures / Treatments  ? ?Labs ?(all labs ordered are listed, but only abnormal results are displayed) ?Labs Reviewed - No data to display ? ? ?EKG ? ? ? ? ?RADIOLOGY ? ?I personally viewed and evaluated these images as part of my medical decision making, as well as reviewing the written report by the radiologist. ? ?ED Provider Interpretation: No acute traumatic findings on CT scan of the head, face, cervical spine.  No fractures identified on bilateral knees or hip x-ray.  No loosening of hardware on bilateral knee x-ray. ? ?CT  Head Wo Contrast ? ?Result Date: 07/21/2021 ?CLINICAL DATA:  Fall EXAM: CT HEAD WITHOUT CONTRAST TECHNIQUE: Contiguous axial images were obtained from the base of the skull through the vertex without intravenous contrast. RADIATION DOSE REDUCTION: This exam was performed according to the departmental dose-optimization program which includes automated exposure control, adjustment of the mA and/or kV according to patient size and/or use of iterative reconstruction technique. COMPARISON:  None. FINDINGS: Brain: No acute intracranial abnormality. Specifically, no hemorrhage, hydrocephalus, mass lesion, acute infarction, or significant intracranial injury. Vascular: No hyperdense vessel or unexpected calcification. Skull: No acute calvarial abnormality. Sinuses/Orbits: No acute findings Other: None IMPRESSION: Normal study. Electronically Signed   By: Rolm Baptise M.D.   On: 07/21/2021 21:57  ? ?CT Cervical Spine Wo Contrast ? ?Result Date: 07/21/2021 ?CLINICAL DATA:  Polytrauma, blunt.  Fall. EXAM: CT CERVICAL SPINE WITHOUT CONTRAST TECHNIQUE: Multidetector CT imaging of the cervical spine was performed without intravenous contrast. Multiplanar CT image reconstructions were also generated. RADIATION DOSE REDUCTION: This exam was performed according to the departmental dose-optimization program which includes automated exposure control, adjustment of the mA and/or kV according to patient size and/or use of iterative reconstruction technique. COMPARISON:  None. FINDINGS: Alignment: No subluxation Skull base and vertebrae: No acute fracture. No primary bone lesion or focal pathologic process. Soft tissues and spinal canal: No prevertebral fluid or swelling. No visible canal hematoma. Disc levels: Diffuse degenerative disc disease and facet disease. 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 Upper chest: Ground-glass airspace opacities in the upper lobes. This has improved since prior CT angio chest 01/30/2021. Other: None  IMPRESSION: Degenerative disc and facet disease.  No acute bony abnormality. Electronically Signed   By: Rolm Baptise M.D.   On: 07/21/2021 21:59  ? ?DG Knee Complete 4 Views Left ? ?Result Date: 07/21/2021 ?CLINICAL DATA:  Fall, bilateral knee pain EXAM: LEFT KNEE - COMPLETE 4+ VIEW COMPARISON:  None. FINDINGS: Prior left knee replacement. No hardware complicating feature. No acute bony abnormality. Specifically, no fracture, subluxation, or dislocation. No joint effusion. IMPRESSION: Prior left knee replacement.  No acute bony abnormality. Electronically Signed   By: Rolm Baptise M.D.   On: 07/21/2021 22:14  ? ?DG Knee Complete 4 Views Right ? ?Result Date: 07/21/2021 ?CLINICAL DATA:  Fall, bilateral knee pain EXAM: RIGHT KNEE - COMPLETE 4+ VIEW COMPARISON:  None. FINDINGS: Changes of right knee replacement. No hardware complicating feature. No acute bony abnormality. Specifically, no fracture, subluxation, or dislocation. No joint effusion. IMPRESSION: Prior right knee replacement.  No acute bony abnormality. Electronically Signed   By: Rolm Baptise M.D.   On: 07/21/2021 22:13  ? ?DG Hip Unilat W or Wo Pelvis  2-3 Views Right ? ?Result Date: 07/21/2021 ?CLINICAL DATA:  fall, R hip pain with ecchymosis, bilateral knee pain EXAM: DG HIP (WITH OR WITHOUT PELVIS) 2-3V RIGHT COMPARISON:  X-ray right knee 07/21/2021 FINDINGS: There is no evidence of hip fracture or dislocation of the right hip. Frontal view of left hip grossly unremarkable. No acute displaced fracture or diastasis of the bones. Degenerative changes of the visualized lower lumbar spine. There is no evidence of arthropathy or other focal bone abnormality. Tip of knee prosthesis noted along the mid femoral shaft. IMPRESSION: 1. No acute displaced fracture or dislocation of the right hip. 2. No acute displaced fracture or diastasis of the bones of the pelvis. Electronically Signed   By: Iven Finn M.D.   On: 07/21/2021 22:13  ? ?CT Maxillofacial Wo  Contrast ? ?Result Date: 07/21/2021 ?CLINICAL DATA:  Fall.  Facial trauma, blunt EXAM: CT MAXILLOFACIAL WITHOUT CONTRAST TECHNIQUE: Multidetector CT imaging of the maxillofacial structures was performed. Multiplanar

## 2021-08-03 ENCOUNTER — Ambulatory Visit (INDEPENDENT_AMBULATORY_CARE_PROVIDER_SITE_OTHER): Payer: Medicare HMO | Admitting: Nurse Practitioner

## 2021-08-03 VITALS — BP 120/78 | HR 69 | Temp 97.1°F | Resp 14 | Ht 63.0 in | Wt 178.5 lb

## 2021-08-03 DIAGNOSIS — M359 Systemic involvement of connective tissue, unspecified: Secondary | ICD-10-CM | POA: Diagnosis not present

## 2021-08-03 DIAGNOSIS — R0602 Shortness of breath: Secondary | ICD-10-CM

## 2021-08-03 DIAGNOSIS — T148XXA Other injury of unspecified body region, initial encounter: Secondary | ICD-10-CM | POA: Diagnosis not present

## 2021-08-03 DIAGNOSIS — J849 Interstitial pulmonary disease, unspecified: Secondary | ICD-10-CM | POA: Diagnosis not present

## 2021-08-03 DIAGNOSIS — M329 Systemic lupus erythematosus, unspecified: Secondary | ICD-10-CM

## 2021-08-03 LAB — CBC
HCT: 40.6 % (ref 36.0–46.0)
Hemoglobin: 13.7 g/dL (ref 12.0–15.0)
MCHC: 33.7 g/dL (ref 30.0–36.0)
MCV: 87.9 fl (ref 78.0–100.0)
Platelets: 231 10*3/uL (ref 150.0–400.0)
RBC: 4.61 Mil/uL (ref 3.87–5.11)
RDW: 14.8 % (ref 11.5–15.5)
WBC: 8.3 10*3/uL (ref 4.0–10.5)

## 2021-08-03 NOTE — Assessment & Plan Note (Signed)
History of the same is being managed by pulmonology.  Patient currently on albuterol and Symbicort inhalers.  Patient states the Symbicort inhaler is expensive and working to get it in a cheaper price but has been cutting the dose in half using only once a day versus twice a day to make her medication last longer. ?

## 2021-08-03 NOTE — Progress Notes (Signed)
? ?Established Patient Office Visit ? ?Subjective   ?Patient ID: Dana Doyle, female    DOB: 1951-03-27  Age: 71 y.o. MRN: [id number] ? ?Chief Complaint  ?Patient presents with  ? ER follow up  ?  [place] about 2 weeks ago, has hematoma on right hip, has noticed increased SOB.   ? ? ?HPI ? ? States that she was seen in the ED on 07/21/2021 for a mechanical fall. She didi have a CT of head, neck ? ?States that a few days after the fall she noticed some increase shortness of breath. She is followed by pulmonology and did send them a message yesterday. States that she feels that weather can effect her breathing and is unsure if that is what has happened. States that it can be all the time but worse with movement. Increasing use of PRN oxygen through the day at 3l and 3.5l at night. 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 She is currently maintained on albuterol inhaler and Symbicort. She did mention that Symbicort inhaler is expensive and she has only been using it once a day for the past month vs twice a day as prescribed ? ?Hematoma on the right side that is improving. Per patinet report when she got home and it was the size of a softball maybe large ? ?States that she has been using the symbicort but has been doing on once a day due to doing dosing for approx 1 month. Did get the link ffinan ? ?On prednsione '10mg'$  a day currently and states that in the ED they did give her a tapering dose of prednisone that did well. ? ? ? ? ?Review of Systems  ?Constitutional:  Negative for chills and fever.  ?Respiratory:  Positive for shortness of breath (increased).   ?Cardiovascular:  Negative for chest pain, palpitations, orthopnea and leg swelling.  ?Gastrointestinal:  Negative for abdominal pain, diarrhea, nausea and vomiting.  ?     BM daily ?  ?Genitourinary:  Negative for dysuria and hematuria.  ?Skin:  Positive for itching.  ?Neurological:  Positive for dizziness (Intermittent baseline) and tingling. Negative for  headaches.  ? ?  ?Objective:  ?  ? ?BP 120/78   Pulse 69   Temp (!) 97.1 ?F (36.2 ?C)   Resp 14   Ht '5\' 3"'$  (1.6 m)   Wt 178 lb 8 oz (81 kg)   SpO2 99% Comment: on 3 liters of O2  BMI 31.62 kg/m?  ? ? ?Physical Exam ?Vitals and nursing note reviewed.  ?Constitutional:   ?   Appearance: Normal appearance.  ?Cardiovascular:  ?   Rate and Rhythm: Normal rate and regular rhythm.  ?   Pulses: Normal pulses.  ?   Heart sounds: Normal heart sounds.  ?Pulmonary:  ?   Breath sounds: Normal breath sounds.  ?Musculoskeletal:  ?   Right hip: Tenderness present. No deformity. Normal strength.  ?     Legs: ? ?Skin: ?   General: Skin is warm.  ?   Findings: Bruising present.  ? ?    ?   Comments: 22.5cm x 25.5 cm  ?Neurological:  ?   Mental Status: She is alert.  ?   Comments: Bilateral lower extremity strength 5/5  ? ? ? ?No results found for any visits on 08/03/21. ? ? ? ?The 10-year ASCVD risk score (Arnett DK, et al., 2019) is: 20.1% ? ?  ?Assessment & Plan:  ? ?Problem List Items Addressed This Visit   ? ?  ?  Respiratory  ? 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  History of the same is being managed by pulmonology.  Patient currently on albuterol and Symbicort inhalers.  Patient states the Symbicort inhaler is expensive and working to get it in a cheaper price but has been cutting the dose in half using only once a day versus twice a day to make her medication last longer. ? ?  ?  ?  ? Musculoskeletal and Integument  ? SLE (systemic lupus erythematosus related syndrome) ([place])  ?  Currently managed by rheumatology.  This factors and to some of her joint pains.  Currently maintained on 10 mg of prednisone but still experiencing joint pains ? ?  ?  ?  ? Other  ? Shortness of breath - Primary  ?  History of the same.  Patient does have ILD and followed by pulmonology.  Currently on 3.5 L of nasal cannula oxygen at night and 3 L as needed to the day.  Patient is concerned because she feels  increased shortness of breath that is required her to have her oxygen more often than normal patient is concerned that she may have a PE as she had a DVT in the past.  Will defer D-dimer drawn today as patient has a large hematoma to the right hip which will most certainly come back positive.  Patient has no pain or trouble taking a deep breath not tachycardic O2 sats look great in office.  Continue to monitor do think this is multifactorial in regards to ILD and patient not been able to take Symbicort as prescribed due to cost of medication and trying to stretch her doses to make medication last. ? ?  ?  ? Relevant Orders  ? CBC  ? Hematoma  ? Relevant Orders  ? CBC  ? ? ?Return in about 6 months (around 02/03/2022) for recheck.  ? ? ?Romilda Garret, NP ? ?

## 2021-08-03 NOTE — Patient Instructions (Signed)
Nice to see you today ?I will be in touch with the lab and information regarding further management of hematoma ?I want to see you in 6 months, sooner if you need me ? ?

## 2021-08-03 NOTE — Assessment & Plan Note (Signed)
Currently managed by rheumatology.  This factors and to some of her joint pains.  Currently maintained on 10 mg of prednisone but still experiencing joint pains ?

## 2021-08-03 NOTE — Assessment & Plan Note (Signed)
History of the same.  Patient does have ILD and followed by pulmonology.  Currently on 3.5 L of nasal cannula oxygen at night and 3 L as needed to the day.  Patient is concerned because she feels increased shortness of breath that is required her to have her oxygen more often than normal patient is concerned that she may have a PE as she had a DVT in the past.  Will defer D-dimer drawn today as patient has a large hematoma to the right hip which will most certainly come back positive.  Patient has no pain or trouble taking a deep breath not tachycardic O2 sats look great in office.  Continue to monitor do think this is multifactorial in regards to ILD and patient not been able to take Symbicort as prescribed due to cost of medication and trying to stretch her doses to make medication last. ?

## 2021-08-05 ENCOUNTER — Other Ambulatory Visit: Payer: Self-pay | Admitting: Nurse Practitioner

## 2021-08-06 NOTE — Telephone Encounter (Signed)
Spoke with patient. Patient has been taking 1 tablet once daily and was not able to break them in half, the tablet falls apart. She does feel like she needs a better control on her symptoms with the last 6 months she had and forgot to ask about this on her last visit if she can try taking 2 tablets daily. ?

## 2021-08-07 ENCOUNTER — Other Ambulatory Visit: Payer: Self-pay | Admitting: Nurse Practitioner

## 2021-08-07 DIAGNOSIS — F419 Anxiety disorder, unspecified: Secondary | ICD-10-CM

## 2021-08-07 MED ORDER — SERTRALINE HCL 50 MG PO TABS: 50.0000 mg | ORAL_TABLET | Freq: Every day | ORAL | 0 refills | Status: DC

## 2021-08-07 MED ORDER — SERTRALINE HCL 100 MG PO TABS: 100.0000 mg | ORAL_TABLET | Freq: Every day | ORAL | 1 refills | Status: DC

## 2021-08-07 NOTE — Telephone Encounter (Signed)
Patient advised. Patient states she has a pill cutter and used that and medication crumbled still. She would like to try and do RX for 100 mg and 50 mg and take that together.  ?

## 2021-08-08 ENCOUNTER — Ambulatory Visit (INDEPENDENT_AMBULATORY_CARE_PROVIDER_SITE_OTHER): Payer: Medicare HMO | Admitting: *Deleted

## 2021-08-08 DIAGNOSIS — Z0000 Encounter for general adult medical examination without abnormal findings: Secondary | ICD-10-CM

## 2021-08-08 NOTE — Progress Notes (Signed)
? ?Subjective:  ? Dana Doyle is a 71 y.o. female who presents for Medicare Annual (Subsequent) preventive examination. ? ?I connected with  Dana Doyle on 08/08/21 by a telephone enabled telemedicine application and verified that I am speaking with the correct person using two identifiers. ?  ?I discussed the limitations of evaluation and management by telemedicine. The patient expressed understanding and agreed to proceed. ? ?Patient location: home ? ?Provider location: Tele-Health - home ? ? ? ?Review of Systems    ? ?Cardiac Risk Factors include: advanced age (>36mn, >>47women);sedentary lifestyle ? ?   ?Objective:  ?  ?Today's Vitals  ? 08/08/21 1034  ?PainSc: 7   ? ?There is no height or weight on file to calculate BMI. ? ? ?  08/08/2021  ? 10:39 AM 07/21/2021  ?  7:59 PM 03/23/2021  ?  6:15 AM 01/31/2021  ?  4:18 AM  ?Advanced Directives  ?Does Patient Have a Medical Advance Directive? Yes No Yes No  ?Type of [place]aramed[place]of [place] will   ?Does patient want to make changes to medical advance directive?   No - Patient declined   ?Copy of HF[place]in Chart? No - copy requested  No - copy requested   ?Would patient like information on creating a medical advance directive? No - Patient declined No - Patient declined  No - Guardian declined  ? ? ?Current Medications (verified) ?Outpatient Encounter Medications as of 08/08/2021  ?Medication Sig  ? albuterol (VENTOLIN HFA) 108 (90 Base) MCG/ACT inhaler Inhale 1-2 puffs into the lungs every 6 (six) hours as needed for wheezing or shortness of breath.  ? budesonide-formoterol (SYMBICORT) 160-4.5 MCG/ACT inhaler Inhale 2 puffs into the lungs in the morning and at bedtime.  ? busPIRone (BUSPAR) 5 MG tablet Take 1 tablet (5 mg total) by mouth 2 (two) times daily. (Patient taking differently: Take 5 mg by mouth at bedtime as needed (sleep).)  ? Cholecalciferol (VITAMIN D3) 250 MCG  (10000 UT) capsule Take 10,000 Units by mouth daily.  ? clonazePAM (KLONOPIN) 0.5 MG tablet Take 0.5 mg by mouth 2 (two) times daily as needed.  ? furosemide (LASIX) 20 MG tablet TAKE 1 TABLET BY MOUTH TWICE A DAY  ? hydroxychloroquine (PLAQUENIL) 200 MG tablet Take 400 mg by mouth daily.  ? KRILL OIL PO Take 1 capsule by mouth daily in the afternoon.  ? levocetirizine (XYZAL) 5 MG tablet Take 5 mg by mouth daily.  ? levothyroxine (SYNTHROID) 50 MCG tablet TAKE 1 TABLET BY MOUTH EVERY DAY BEFORE BREAKFAST (Patient taking differently: Take 50 mcg by mouth daily before breakfast.)  ? metoprolol succinate (TOPROL-XL) 25 MG 24 hr tablet TAKE 1 TABLET (25 MG TOTAL) BY MOUTH DAILY.  ? montelukast (SINGULAIR) 10 MG tablet Take 10 mg by mouth every morning.  ? OXYGEN Inhale 2.5-3 L into the lungs as needed.  ? pantoprazole (PROTONIX) 40 MG tablet TAKE 1 TABLET BY MOUTH EVERY DAY  ? potassium chloride SA (KLOR-CON M) 20 MEQ tablet Take 2 tablets (40 mEq total) by mouth daily.  ? predniSONE (DELTASONE) 10 MG tablet Take 1 tablet (10 mg total) by mouth as directed.  ? riTUXimab (RITUXAN IV) Inject into the vein every 6 (six) months.  ? sertraline (ZOLOFT) 100 MG tablet Take 1 tablet (100 mg total) by mouth daily. Take along with 1 '50mg'$  tablet to total '150mg'$  total  ? sertraline (ZOLOFT) 50 MG tablet  Take 1 tablet (50 mg total) by mouth daily. Along with 1 '100mg'$  tablet daily to total '150mg'$  daily  ? sulfamethoxazole-trimethoprim (BACTRIM DS) 800-160 MG tablet Take 1 tablet by mouth every Monday, Wednesday, and Friday.  ? ondansetron (ZOFRAN) 4 MG tablet Take 1 tablet (4 mg total) by mouth every 8 (eight) hours as needed for nausea or vomiting. (Patient not taking: Reported on 08/03/2021)  ? ?No facility-administered encounter medications on file as of 08/08/2021.  ? ? ?Allergies (verified) ?Belimumab, Mycophenolate mofetil, Phenergan [promethazine hcl], Codeine, Meloxicam, Statins, Ezetimibe, Hydroxyzine, and Tape  ? ?History: ?Past  Medical History:  ?Diagnosis Date  ? Anxiety   ? Arthritis   ? Diabetes mellitus without complication ([place])   ? GERD (gastroesophageal reflux disease)   ? Giardia   ? Hyperlipidemia   ? Hypertension   ? IBS (irritable bowel syndrome)   ? Interstitial lung disease ([place])   ? Lupus ([place])   ? MVP (mitral valve prolapse)   ? Obesity   ? Thyroid disease   ? ?Past Surgical History:  ?Procedure Laterality Date  ? ABDOMINAL HYSTERECTOMY    ? total  ? CHOLECYSTECTOMY    ? CYSTECTOMY Right   ? hip  ? REPLACEMENT TOTAL KNEE BILATERAL    ? REVISION TOTAL KNEE ARTHROPLASTY Right   ? RIGHT HEART CATH N/A 03/23/2021  ? Procedure: RIGHT HEART CATH;  Surgeon: Martinique, Peter M, MD;  Location: [facility];  Service: Cardiovascular;  Laterality: N/A;  ? TONSILLECTOMY AND ADENOIDECTOMY    ? ?Family History  ?Adopted: Yes  ?Problem Relation Age of Onset  ? Cancer Brother   ?     prostates cancer, lung cancer,  ? ?Social History  ? ?Socioeconomic History  ? Marital status: Widowed  ?  Spouse name: Not on file  ? Number of children: 3  ? Years of education: Not on file  ? Highest education level: Not on file  ?Occupational History  ? Not on file  ?Tobacco Use  ? Smoking status: Never  ? Smokeless tobacco: Never  ?Substance and Sexual Activity  ? Alcohol use: No  ? Drug use: No  ? Sexual activity: Never  ?  Birth control/protection: Surgical  ?Other Topics Concern  ? Not on file  ?Social History Narrative  ? Daily caffeine   ? ?Social Determinants of Health  ? ?Financial Resource Strain: Low Risk   ? Difficulty of Paying Living Expenses: Not hard at all  ?Food Insecurity: No Food Insecurity  ? Worried About Charity fundraiser in the Last Year: Never true  ? Ran Out of Food in the Last Year: Never true  ?Transportation Needs: No Transportation Needs  ? Lack of Transportation (Medical): No  ? Lack of Transportation (Non-Medical): No  ?Physical Activity: Inactive  ? Days of Exercise per Week: 0 days  ? Minutes of Exercise per Session: 0  min  ?Stress: Stress Concern Present  ? Feeling of Stress : To some extent  ?Social Connections: Socially Isolated  ? Frequency of Communication with Friends and Family: Once a week  ? Frequency of Social Gatherings with Friends and Family: Twice a week  ? Attends Religious Services: Never  ? Active Member of Clubs or Organizations: No  ? Attends Archivist Meetings: Never  ? Marital Status: Widowed  ? ? ?Tobacco Counseling ?Counseling given: Not Answered ? ? ?Clinical Intake: ? ?Pre-visit preparation completed: Yes ? ?Pain : 0-10 ?Pain Score: 7  ?Pain Type: Chronic pain ?  Pain Location:  (whole body) ?Pain Radiating Towards: patient has lupus whole body aches ?Pain Descriptors / Indicators: Constant, Aching ?Pain Onset: More than a month ago ?Pain Frequency: Constant ?Pain Relieving Factors: tylenol ? ?Pain Relieving Factors: tylenol ? ?Nutritional Risks: None ?Diabetes: No ? ?How often do you need to have someone help you when you read instructions, pamphlets, or other written materials from your doctor or pharmacy?: 1 - Never ? ?Diabetic?  No  ? ?Controls with diet ? ?Interpreter Needed?: No ? ?Information entered by :: Leroy Kennedy LPN ? ? ?Activities of Daily Living ? ?  08/08/2021  ? 10:45 AM 01/31/2021  ?  4:18 AM  ?In your present state of health, do you have any difficulty performing the following activities:  ?Hearing? 1 0  ?Vision? 0 0  ?Difficulty concentrating or making decisions? 0 0  ?Walking or climbing stairs? 1 0  ?Dressing or bathing? 0 0  ?Doing errands, shopping? 0 0  ?Preparing Food and eating ? N   ?Using the Toilet? N   ?In the past six months, have you accidently leaked urine? N   ?Do you have problems with loss of bowel control? N   ?Managing your Medications? N   ?Managing your Finances? N   ?Housekeeping or managing your Housekeeping? N   ? ? ?Patient Care Team: ?Michela Pitcher, NP as PCP - General ?Charlton Haws, [facility] as Pharmacist (Pharmacist) ? ?Indicate any recent Medical  Services you may have received from other than [facility] providers in the past year (date may be approximate). ? ?   ?Assessment:  ? This is a routine wellness examination for Dana Doyle. ? ?Hearing/Vision

## 2021-08-08 NOTE — Patient Instructions (Signed)
Dana Doyle , ?Thank you for taking time to come for your Medicare Wellness Visit. I appreciate your ongoing commitment to your health goals. Please review the following plan we discussed and let me know if I can assist you in the future.  ? ?Screening recommendations/referrals: ?Colonoscopy: up to date ?Mammogram: up date ?Bone Density: up to date ?Recommended yearly ophthalmology/optometry visit for glaucoma screening and checkup ?Recommended yearly dental visit for hygiene and checkup ? ?Vaccinations: ?Influenza vaccine: Education provided ?Pneumococcal vaccine: up to date ?Tdap vaccine: up to date ?Shingles vaccine: Education provided   ? ?Advanced directives: Education provided ? ?Conditions/risks identified:  ? ? ? ?Preventive Care 33 Years and Older, Female ?Preventive care refers to lifestyle choices and visits with your health care provider that can promote health and wellness. ?What does preventive care include? ?A yearly physical exam. This is also called an annual well check. ?Dental exams once or twice a year. ?Routine eye exams. Ask your health care provider how often you should have your eyes checked. ?Personal lifestyle choices, including: ?Daily care of your teeth and gums. ?Regular physical activity. ?Eating a healthy diet. ?Avoiding tobacco and drug use. ?Limiting alcohol use. ?Practicing safe sex. ?Taking low-dose aspirin every day. ?Taking vitamin and mineral supplements as recommended by your health care provider. ?What happens during an annual well check? ?The services and screenings done by your health care provider during your annual well check will depend on your age, overall health, lifestyle risk factors, and family history of disease. ?Counseling  ?Your health care provider may ask you questions about your: ?Alcohol use. ?Tobacco use. ?Drug use. ?Emotional well-being. ?Home and relationship well-being. ?Sexual activity. ?Eating habits. ?History of falls. ?Memory and ability to understand  (cognition). ?Work and work Statistician. ?Reproductive health. ?Screening  ?You may have the following tests or measurements: ?Height, weight, and BMI. ?Blood pressure. ?Lipid and cholesterol levels. These may be checked every 5 years, or more frequently if you are over 108 years old. ?Skin check. ?Lung cancer screening. You may have this screening every year starting at age 71 if you have a 30-pack-year history of smoking and currently smoke or have quit within the past 15 years. ?Fecal occult blood test (FOBT) of the stool. You may have this test every year starting at age 57. ?Flexible sigmoidoscopy or colonoscopy. You may have a sigmoidoscopy every 5 years or a colonoscopy every 10 years starting at age 32. ?Hepatitis C blood test. ?Hepatitis B blood test. ?Sexually transmitted disease (STD) testing. ?Diabetes screening. This is done by checking your blood sugar (glucose) after you have not eaten for a while (fasting). You may have this done every 1-3 years. ?Bone density scan. This is done to screen for osteoporosis. You may have this done starting at age 78. ?Mammogram. This may be done every 1-2 years. Talk to your health care provider about how often you should have regular mammograms. ?Talk with your health care provider about your test results, treatment options, and if necessary, the need for more tests. ?Vaccines  ?Your health care provider may recommend certain vaccines, such as: ?Influenza vaccine. This is recommended every year. ?Tetanus, diphtheria, and acellular pertussis (Tdap, Td) vaccine. You may need a Td booster every 10 years. ?Zoster vaccine. You may need this after age 77. ?Pneumococcal 13-valent conjugate (PCV13) vaccine. One dose is recommended after age 38. ?Pneumococcal polysaccharide (PPSV23) vaccine. One dose is recommended after age 20. ?Talk to your health care provider about which screenings and vaccines you need and  how often you need them. ?This information is not intended to  replace advice given to you by your health care provider. Make sure you discuss any questions you have with your health care provider. ?Document Released: 04/14/2015 Document Revised: 12/06/2015 Document Reviewed: 01/17/2015 ?Elsevier Interactive Patient Education ? 2017 [place]. ? ?Fall Prevention in the Home ?Falls can cause injuries. They can happen to people of all ages. There are many things you can do to make your home safe and to help prevent falls. ?What can I do on the outside of my home? ?Regularly fix the edges of walkways and driveways and fix any cracks. ?Remove anything that might make you trip as you walk through a door, such as a raised step or threshold. ?Trim any bushes or trees on the path to your home. ?Use bright outdoor lighting. ?Clear any walking paths of anything that might make someone trip, such as rocks or tools. ?Regularly check to see if handrails are loose or broken. Make sure that both sides of any steps have handrails. ?Any raised decks and porches should have guardrails on the edges. ?Have any leaves, snow, or ice cleared regularly. ?Use sand or salt on walking paths during winter. ?Clean up any spills in your garage right away. This includes oil or grease spills. ?What can I do in the bathroom? ?Use night lights. ?Install grab bars by the toilet and in the tub and shower. Do not use towel bars as grab bars. ?Use non-skid mats or decals in the tub or shower. ?If you need to sit down in the shower, use a plastic, non-slip stool. ?Keep the floor dry. Clean up any water that spills on the floor as soon as it happens. ?Remove soap buildup in the tub or shower regularly. ?Attach bath mats securely with double-sided non-slip rug tape. ?Do not have throw rugs and other things on the floor that can make you trip. ?What can I do in the bedroom? ?Use night lights. ?Make sure that you have a light by your bed that is easy to reach. ?Do not use any sheets or blankets that are too big for  your bed. They should not hang down onto the floor. ?Have a firm chair that has side arms. You can use this for support while you get dressed. ?Do not have throw rugs and other things on the floor that can make you trip. ?What can I do in the kitchen? ?Clean up any spills right away. ?Avoid walking on wet floors. ?Keep items that you use a lot in easy-to-reach places. ?If you need to reach something above you, use a strong step stool that has a grab bar. ?Keep electrical cords out of the way. ?Do not use floor polish or wax that makes floors slippery. If you must use wax, use non-skid floor wax. ?Do not have throw rugs and other things on the floor that can make you trip. ?What can I do with my stairs? ?Do not leave any items on the stairs. ?Make sure that there are handrails on both sides of the stairs and use them. Fix handrails that are broken or loose. Make sure that handrails are as long as the stairways. ?Check any carpeting to make sure that it is firmly attached to the stairs. Fix any carpet that is loose or worn. ?Avoid having throw rugs at the top or bottom of the stairs. If you do have throw rugs, attach them to the floor with carpet tape. ?Make sure that you have  a light switch at the top of the stairs and the bottom of the stairs. If you do not have them, ask someone to add them for you. ?What else can I do to help prevent falls? ?Wear shoes that: ?Do not have high heels. ?Have rubber bottoms. ?Are comfortable and fit you well. ?Are closed at the toe. Do not wear sandals. ?If you use a stepladder: ?Make sure that it is fully opened. Do not climb a closed stepladder. ?Make sure that both sides of the stepladder are locked into place. ?Ask someone to hold it for you, if possible. ?Clearly mark and make sure that you can see: ?Any grab bars or handrails. ?First and last steps. ?Where the edge of each step is. ?Use tools that help you move around (mobility aids) if they are needed. These  include: ?Canes. ?Walkers. ?Scooters. ?Crutches. ?Turn on the lights when you go into a dark area. Replace any light bulbs as soon as they burn out. ?Set up your furniture so you have a clear path. Avoid moving your furniture

## 2021-09-06 DIAGNOSIS — Z79899 Other long term (current) drug therapy: Secondary | ICD-10-CM | POA: Diagnosis not present

## 2021-09-06 DIAGNOSIS — H43813 Vitreous degeneration, bilateral: Secondary | ICD-10-CM | POA: Diagnosis not present

## 2021-09-06 DIAGNOSIS — H04123 Dry eye syndrome of bilateral lacrimal glands: Secondary | ICD-10-CM | POA: Diagnosis not present

## 2021-10-18 DIAGNOSIS — K224 Dyskinesia of esophagus: Secondary | ICD-10-CM | POA: Diagnosis not present

## 2021-10-18 DIAGNOSIS — J984 Other disorders of lung: Secondary | ICD-10-CM | POA: Diagnosis not present

## 2021-10-18 DIAGNOSIS — G4730 Sleep apnea, unspecified: Secondary | ICD-10-CM | POA: Diagnosis not present

## 2021-10-18 DIAGNOSIS — M2550 Pain in unspecified joint: Secondary | ICD-10-CM | POA: Diagnosis not present

## 2021-10-18 DIAGNOSIS — M329 Systemic lupus erythematosus, unspecified: Secondary | ICD-10-CM | POA: Diagnosis not present

## 2021-10-18 DIAGNOSIS — K219 Gastro-esophageal reflux disease without esophagitis: Secondary | ICD-10-CM | POA: Diagnosis not present

## 2021-10-18 DIAGNOSIS — J849 Interstitial pulmonary disease, unspecified: Secondary | ICD-10-CM | POA: Diagnosis not present

## 2021-10-18 DIAGNOSIS — J9601 Acute respiratory failure with hypoxia: Secondary | ICD-10-CM | POA: Diagnosis not present

## 2021-10-18 DIAGNOSIS — R0609 Other forms of dyspnea: Secondary | ICD-10-CM | POA: Diagnosis not present

## 2021-10-18 DIAGNOSIS — F419 Anxiety disorder, unspecified: Secondary | ICD-10-CM | POA: Diagnosis not present

## 2021-10-30 ENCOUNTER — Other Ambulatory Visit: Payer: Self-pay | Admitting: Nurse Practitioner

## 2021-10-30 DIAGNOSIS — F419 Anxiety disorder, unspecified: Secondary | ICD-10-CM

## 2021-11-07 ENCOUNTER — Telehealth: Payer: Self-pay | Admitting: Nurse Practitioner

## 2021-11-07 DIAGNOSIS — M159 Polyosteoarthritis, unspecified: Secondary | ICD-10-CM

## 2021-11-07 NOTE — Telephone Encounter (Signed)
See below, patient wanted to get a referral to provider below to try therapy for her symptoms. Ok to do so or needs appointment first? Please see comments about appointment below also. Thank you

## 2021-11-07 NOTE — Telephone Encounter (Signed)
We tradionally do not refer to chiropractors she can just go be seen. I am happy to see her in office and evaluate her sympotms

## 2021-11-07 NOTE — Telephone Encounter (Signed)
Spoke with [employer] office to discuss this therapy and referral. Per receptionist sometimes with [employer] policy they need a referral vs just commercial plan. She also stated that this therapy is not covered under insurances so far and it would be $65 out of pocket each time unless patient has chiropractic services with them and then add on the PEMF therapy and then it would be about $25. I talked to the patient and she said she spoke with her insurance and was told she needs referral and chiropractic services are covered with a copay not sure if they understood that she specifically wanted to know about PEMF therapy but either way patient would like to see that office and discuss her symptoms and see if they can help. Patient declined an appointment here because she does not feel like there is anything the provider can do for her except adding medication and she does not want to add anymore at this time.  Patient will be at [facility] all day tomorrow 11/08/21 and asked for call back on 11/09/21.

## 2021-11-07 NOTE — Telephone Encounter (Signed)
Chiropractic referral  [facility] [address] [place]  Phone: [phone]  Re: PEMB  Therapy for neck and headaches in back of head, concerned it is coming from her lupus   Tried to make an appointment, but Patient is having an infusion tomorrow and it makes her really tired said she could do a video visit at the earliest Friday pm  Please follow up with the patient

## 2021-11-08 DIAGNOSIS — M329 Systemic lupus erythematosus, unspecified: Secondary | ICD-10-CM | POA: Diagnosis not present

## 2021-11-08 NOTE — Telephone Encounter (Signed)
The referral is fine

## 2021-11-09 NOTE — Telephone Encounter (Signed)
Patient advised referral sent over already per chart.

## 2021-11-16 DIAGNOSIS — Z79899 Other long term (current) drug therapy: Secondary | ICD-10-CM | POA: Diagnosis not present

## 2021-11-16 DIAGNOSIS — M329 Systemic lupus erythematosus, unspecified: Secondary | ICD-10-CM | POA: Diagnosis not present

## 2021-11-16 DIAGNOSIS — J849 Interstitial pulmonary disease, unspecified: Secondary | ICD-10-CM | POA: Diagnosis not present

## 2021-11-21 ENCOUNTER — Ambulatory Visit: Payer: Medicare HMO | Admitting: Family Medicine

## 2021-11-21 ENCOUNTER — Encounter: Payer: Self-pay | Admitting: Nurse Practitioner

## 2021-11-21 ENCOUNTER — Ambulatory Visit (INDEPENDENT_AMBULATORY_CARE_PROVIDER_SITE_OTHER): Payer: Medicare HMO | Admitting: Nurse Practitioner

## 2021-11-21 VITALS — BP 128/76 | HR 75 | Temp 97.5°F | Resp 16 | Ht 63.0 in | Wt 189.4 lb

## 2021-11-21 DIAGNOSIS — N3000 Acute cystitis without hematuria: Secondary | ICD-10-CM

## 2021-11-21 DIAGNOSIS — M545 Low back pain, unspecified: Secondary | ICD-10-CM

## 2021-11-21 DIAGNOSIS — M1812 Unilateral primary osteoarthritis of first carpometacarpal joint, left hand: Secondary | ICD-10-CM | POA: Diagnosis not present

## 2021-11-21 DIAGNOSIS — M1811 Unilateral primary osteoarthritis of first carpometacarpal joint, right hand: Secondary | ICD-10-CM | POA: Diagnosis not present

## 2021-11-21 LAB — POC URINALSYSI DIPSTICK (AUTOMATED)
Bilirubin, UA: NEGATIVE
Blood, UA: NEGATIVE
Glucose, UA: NEGATIVE
Ketones, UA: NEGATIVE
Nitrite, UA: NEGATIVE
Protein, UA: NEGATIVE
Spec Grav, UA: <=1.005 — AB (ref 1.010–1.025)
Urobilinogen, UA: 0.2 E.U./dL
pH, UA: 6 (ref 5.0–8.0)

## 2021-11-21 MED ORDER — CEPHALEXIN 500 MG PO CAPS: 500.0000 mg | ORAL_CAPSULE | Freq: Two times a day (BID) | ORAL | 0 refills | Status: AC

## 2021-11-21 NOTE — Progress Notes (Signed)
Acute Office Visit  Subjective:     Patient ID: Dana Doyle, female    DOB: Aug 24, 1950, 71 y.o.   MRN: [id number]  Chief Complaint  Patient presents with   Back Pain    Both sides come and go for a while, went to [facility] last week and UA was abnormal. No urinary discomfort.     Patient is in today for Back pain   States that she went to [facility] for her Rheum and they did blood work. Urine was positive for leukocytes and they told her to follow up with her PCP. States that she has been having frequency but that isnt totally abnormal for her  States that she has been having low back pain for the past 3 weeks. No injury.  Comes and goes. States the pain is descirbed as "it just hurts" states that it is grabbing pain and ice helps.   Review of Systems  Constitutional:  Negative for chills and fever.  Gastrointestinal:  Negative for abdominal pain.       Negative for B&B incontinence   Genitourinary:  Positive for frequency. Negative for dysuria, hematuria and urgency.  Musculoskeletal:  Positive for back pain.  Neurological:  Negative for tingling, focal weakness and weakness.        Objective:    BP 128/76   Pulse 75   Temp (!) 97.5 F (36.4 C)   Resp 16   Ht '5\' 3"'$  (1.6 m)   Wt 189 lb 6 oz (85.9 kg)   SpO2 91%   BMI 33.55 kg/m    Physical Exam Vitals and nursing note reviewed.  Constitutional:      Appearance: Normal appearance.  Cardiovascular:     Rate and Rhythm: Normal rate and regular rhythm.     Heart sounds: Normal heart sounds.  Pulmonary:     Breath sounds: Normal breath sounds.  Abdominal:     General: Bowel sounds are normal.     Tenderness: There is no right CVA tenderness or left CVA tenderness.  Musculoskeletal:     Lumbar back: Bony tenderness present. No tenderness. Negative right straight leg raise test and negative left straight leg raise test.  Neurological:     General: No focal deficit present.     Mental Status: She is alert.     Deep  Tendon Reflexes:     Reflex Scores:      Patellar reflexes are 2+ on the right side and 2+ on the left side.    Comments: Bilateral lower extremity strength 5/5     Results for orders placed or performed in visit on 11/21/21  POCT Urinalysis Dipstick (Automated)  Result Value Ref Range   Color, UA yellow    Clarity, UA clear    Glucose, UA Negative Negative   Bilirubin, UA negative    Ketones, UA negative    Spec Grav, UA <=1.005 (A) 1.010 - 1.025   Blood, UA negative    pH, UA 6.0 5.0 - 8.0   Protein, UA Negative Negative   Urobilinogen, UA 0.2 0.2 or 1.0 E.U./dL   Nitrite, UA negative    Leukocytes, UA Large (3+) (A) Negative        Assessment & Plan:   Problem List Items Addressed This Visit       Genitourinary   Acute cystitis without hematuria    Given age and immune status will elect to treat UTI. Start keflex '500mg'$  BID for 7 days. Pending urine culture.  Relevant Medications   cephALEXin (KEFLEX) 500 MG capsule     Other   Acute bilateral low back pain without sciatica - Primary    No injury. Currently on low dose steroids. Patient deferred imaging at this juncture. She deferred a muscle relaxer after joint discussion. Did offer to send her to PT, she will wait for now but will update me if she changes her mind  No red flag findings on exam       Relevant Orders   POCT Urinalysis Dipstick (Automated) (Completed)   Urine Culture    Meds ordered this encounter  Medications   cephALEXin (KEFLEX) 500 MG capsule    Sig: Take 1 capsule (500 mg total) by mouth 2 (two) times daily for 7 days.    Dispense:  14 capsule    Refill:  0    Order Specific Question:   Supervising Provider    Answer:   Loura Pardon A [[id number]]    Return in about 4 months (around 03/23/2022) for CPE.  Romilda Garret, NP

## 2021-11-21 NOTE — Assessment & Plan Note (Signed)
Given age and immune status will elect to treat UTI. Start keflex '500mg'$  BID for 7 days. Pending urine culture.

## 2021-11-21 NOTE — Assessment & Plan Note (Signed)
No injury. Currently on low dose steroids. Patient deferred imaging at this juncture. She deferred a muscle relaxer after joint discussion. Did offer to send her to PT, she will wait for now but will update me if she changes her mind  No red flag findings on exam

## 2021-11-21 NOTE — Patient Instructions (Signed)
Nice to see you today I will be in touch with the urinary culture I want to see you in 3-4 months.

## 2021-11-22 DIAGNOSIS — M329 Systemic lupus erythematosus, unspecified: Secondary | ICD-10-CM | POA: Diagnosis not present

## 2021-11-22 LAB — URINE CULTURE
MICRO NUMBER:: 13820650
Result:: NO GROWTH
SPECIMEN QUALITY:: ADEQUATE

## 2021-11-27 ENCOUNTER — Telehealth: Payer: Self-pay

## 2021-11-27 NOTE — Chronic Care Management (AMB) (Signed)
    Chronic Care Management Pharmacy Assistant   Name: Dana Doyle  MRN: [id number] DOB: 1950-08-06  Reason for Encounter: Reminder Call   Hospital visits:  None in previous 6 months 07/21/21-[facility] ED)-Fall,xrays,scans,given prednisone '10mg'$   taper dose 10 days- no admission  Medications: Outpatient Encounter Medications as of 11/27/2021  Medication Sig Note   albuterol (VENTOLIN HFA) 108 (90 Base) MCG/ACT inhaler Inhale 1-2 puffs into the lungs every 6 (six) hours as needed for wheezing or shortness of breath.    budesonide-formoterol (SYMBICORT) 160-4.5 MCG/ACT inhaler Inhale 2 puffs into the lungs in the morning and at bedtime.    cephALEXin (KEFLEX) 500 MG capsule Take 1 capsule (500 mg total) by mouth 2 (two) times daily for 7 days.    Cholecalciferol (VITAMIN D3) 250 MCG (10000 UT) capsule Take 10,000 Units by mouth daily.    clonazePAM (KLONOPIN) 0.5 MG tablet Take 0.5 mg by mouth 2 (two) times daily as needed.    furosemide (LASIX) 20 MG tablet TAKE 1 TABLET BY MOUTH TWICE A DAY    hydroxychloroquine (PLAQUENIL) 200 MG tablet Take 400 mg by mouth daily.    KRILL OIL PO Take 1 capsule by mouth daily in the afternoon.    levocetirizine (XYZAL) 5 MG tablet Take 5 mg by mouth daily.    levothyroxine (SYNTHROID) 50 MCG tablet TAKE 1 TABLET BY MOUTH EVERY DAY BEFORE BREAKFAST (Patient taking differently: Take 50 mcg by mouth daily before breakfast.)    metoprolol succinate (TOPROL-XL) 25 MG 24 hr tablet TAKE 1 TABLET (25 MG TOTAL) BY MOUTH DAILY.    montelukast (SINGULAIR) 10 MG tablet Take 10 mg by mouth every morning.    ondansetron (ZOFRAN) 4 MG tablet Take 1 tablet (4 mg total) by mouth every 8 (eight) hours as needed for nausea or vomiting. 03/21/2021: On hand   OXYGEN Inhale 2.5-3 L into the lungs as needed.    pantoprazole (PROTONIX) 40 MG tablet TAKE 1 TABLET BY MOUTH EVERY DAY    Potassium Chloride (KLOR-CON PO) Take by mouth. 150 mg- 2 tablets daily     predniSONE (DELTASONE) 10 MG tablet Take 1 tablet (10 mg total) by mouth as directed.    riTUXimab (RITUXAN IV) Inject into the vein every 6 (six) months.    sertraline (ZOLOFT) 100 MG tablet Take 1 tablet (100 mg total) by mouth daily. Take along with 1 '50mg'$  tablet to total '150mg'$  total    sertraline (ZOLOFT) 50 MG tablet TAKE 1 TABLET (50 MG TOTAL) BY MOUTH DAILY. ALONG WITH 1 '100MG'$  TABLET DAILY TO TOTAL '150MG'$  DAILY    sulfamethoxazole-trimethoprim (BACTRIM DS) 800-160 MG tablet Take 1 tablet by mouth every Monday, Wednesday, and Friday.    No facility-administered encounter medications on file as of 11/27/2021.  [name] was contacted to remind of upcoming telephone visit with Charlene Brooke on 11/30/21 at 8:45am. Patient was reminded to have any blood glucose and blood pressure readings available for review at appointment.   Message was left reminding patient of appointment.  CCM referral has been placed prior to visit?  Yes   Star Rating Drugs: Medication:  Last Fill: Day Supply No star medications identified   Charlene Brooke, CPP notified  Avel Sensor, [place]  [phone]

## 2021-11-30 ENCOUNTER — Encounter: Payer: Self-pay | Admitting: Pharmacist

## 2021-11-30 ENCOUNTER — Ambulatory Visit (INDEPENDENT_AMBULATORY_CARE_PROVIDER_SITE_OTHER): Payer: Medicare HMO | Admitting: Pharmacist

## 2021-11-30 DIAGNOSIS — E7800 Pure hypercholesterolemia, unspecified: Secondary | ICD-10-CM

## 2021-11-30 DIAGNOSIS — M329 Systemic lupus erythematosus, unspecified: Secondary | ICD-10-CM

## 2021-11-30 DIAGNOSIS — J9610 Chronic respiratory failure, unspecified whether with hypoxia or hypercapnia: Secondary | ICD-10-CM

## 2021-11-30 DIAGNOSIS — I10 Essential (primary) hypertension: Secondary | ICD-10-CM

## 2021-11-30 NOTE — Telephone Encounter (Signed)
Opened in error

## 2021-11-30 NOTE — Progress Notes (Signed)
Chronic Care Management Pharmacy Note  11/30/2021 Name:  Dana Doyle MRN:  [id number] DOB:  1950-06-30  Summary: CCM F/U visit -LDL 199 (10/2020), pt has not tolerated statins or ezetimibe and is a good candidate fr PCSK9-inhibotor. Discussed Primary/CCM team can start PCSK9-inhibitor if that is easier than seeing lipid clinic, patient agrees however she cannot afford Tier 3 copay right now. [place] for cholesterol is currently closed.  Recommendations/Changes made from today's visit: -Delay starting Repatha/Praluent until [place] is available for copay assistance  Plan: -CCM team will monitor [place] status, and enroll patient once it opens so she can afford Repatha/Praluent. -Pharmacist follow up televisit scheduled for 4 months     Subjective: Dana Doyle is an 71 y.o. year old female who is a primary patient of Michela Pitcher, NP.  The CCM team was consulted for assistance with disease management and care coordination needs.    Engaged with patient by telephone for follow up visit in response to provider referral for pharmacy case management and/or care coordination services.   Consent to Services:  The patient was given information about Chronic Care Management services, agreed to services, and gave verbal consent prior to initiation of services.  Please see initial visit note for detailed documentation.   Patient Care Team: Michela Pitcher, NP as PCP - General Lenord Fellers Cleaster Corin, [facility] as Pharmacist (Pharmacist)  Recent office visits: 11/21/21 NP Romilda Garret OV: UTI - Keflex. Deferring imaging and PT for low back pain.   11/07/21 TE - referral for chiropractor. 08/03/21 NP Romilda Garret OV: ED f/u, SOB. No changes. 02/19/21 NP Romilda Garret OV: hospital f/u; c/o thrush. Ordered Nystatin suspension. C/o ear infection, ordered Ofloxacin ear drops. C/o anxiety, pt asking about diazepam, PCP advised to avoid benzo d/t recent respiratory failure. Rx'd  Buspar BID. D/c mirtazapine.  Recent consult visits: 11/16/21 Dr Alysia Penna [facility]): f/u SLE. Last Rituxan 11/08/21. Rec prednisone taper. 10/18/21 Dr Lavonda Jumbo ([facility]): f/u - rec sleep study. Reduce prednisone 1 mg at a time when ready to taper. 05/17/21 Dr Lavonda Jumbo ([facility]): ILD, DOE. Continue Symbicort, Bactrim for infection prevention, and prednisone 10 mg daily. Start Klonopin 0.5 mg BID PRN. 05/11/21 Infusion - Rituxan (Lupus)  03/28/21 Cardiology message: increase potassium to 2 tablets daily (K low)  03/19/21 Dr Martinique (cardiology): consult for ILD, DOE. Order R Heart cath to rule out pulmonary HTN.  01/02/21 NP Spaulding ([facility]): f/u SLE. Refilled prednisone 5 mg daily, Plaquenil 400 mg daily.  Hospital visits: 07/21/21 ED visit [facility]): Mechanical fall. No trauma on imaging. 03/23/21 Admission for R heart cath. 01/29/21 - 02/05/21 Admission [facility]): Acute respiratory failure (Covid).   Objective:  Lab Results  Component Value Date   CREATININE 1.17 (H) 03/23/2021   BUN 11 03/23/2021   GFR 51.46 (L) 02/19/2021   EGFR 61 03/19/2021   GFRNONAA 51 (L) 03/23/2021   NA 139 03/23/2021   NA 138 03/23/2021   K 3.3 (L) 03/23/2021   K 3.4 (L) 03/23/2021   CALCIUM 8.8 (L) 03/23/2021   CO2 28 03/23/2021   GLUCOSE 98 03/23/2021    Lab Results  Component Value Date/Time   HGBA1C 5.5 11/20/2020 12:45 PM   HGBA1C 5.3 04/25/2020 11:32 AM   GFR 51.46 (L) 02/19/2021 03:44 PM   GFR 53.90 (L) 11/20/2020 12:45 PM    Last diabetic Eye exam: No results found for: "HMDIABEYEEXA"  Last diabetic Foot exam: No results found for: "HMDIABFOOTEX"   Lab  Results  Component Value Date   CHOL 294 (H) 11/20/2020   HDL 66.50 11/20/2020   LDLCALC 199 (H) 11/20/2020   LDLDIRECT 207.0 04/25/2020   TRIG 143.0 11/20/2020   CHOLHDL 4 11/20/2020       Latest Ref Rng & Units 02/19/2021    3:44 PM 02/04/2021    4:09 AM 02/03/2021    7:00 AM  Hepatic Function   Total Protein 6.0 - 8.3 g/dL 6.5  4.7  5.3   Albumin 3.5 - 5.2 g/dL 3.9  2.6  2.9   AST 0 - 37 U/L 19  17  18    ALT 0 - 35 U/L 16  14  11    Alk Phosphatase 39 - 117 U/L 81  62  69   Total Bilirubin 0.2 - 1.2 mg/dL 0.7  0.3  0.6     Lab Results  Component Value Date/Time   TSH 2.49 11/20/2020 12:45 PM   TSH 4.78 (H) 04/25/2020 11:32 AM   FREET4 0.61 11/20/2020 12:45 PM   FREET4 0.74 04/25/2020 11:32 AM       Latest Ref Rng & Units 08/03/2021   11:43 AM 03/23/2021    7:54 AM 03/19/2021    4:04 PM  CBC  WBC 4.0 - 10.5 K/uL 8.3   10.2   Hemoglobin 12.0 - 15.0 g/dL 13.7  11.6    11.6  12.9   Hematocrit 36.0 - 46.0 % 40.6  34.0    34.0  37.9   Platelets 150.0 - 400.0 K/uL 231.0   278     Lab Results  Component Value Date/Time   VD25OH 85 10/20/2012 12:45 PM    Clinical ASCVD: No  The 10-year ASCVD risk score (Arnett DK, et al., 2019) is: 22.5%   Values used to calculate the score:     Age: 60 years     Sex: Female     Is Non-Hispanic African American: No     Diabetic: Yes     Tobacco smoker: No     Systolic Blood Pressure: 762 mmHg     Is BP treated: Yes     HDL Cholesterol: 66.5 mg/dL     Total Cholesterol: 294 mg/dL       08/08/2021   10:49 AM 04/25/2020   10:58 AM  Depression screen PHQ 2/9  Decreased Interest 1 0  Down, Depressed, Hopeless 2 0  PHQ - 2 Score 3 0  Altered sleeping 3   Tired, decreased energy 3   Change in appetite 1   Feeling bad or failure about yourself  0   Trouble concentrating 0   Moving slowly or fidgety/restless 0   Suicidal thoughts 0   PHQ-9 Score 10   Difficult doing work/chores Somewhat difficult       Social History   Tobacco Use  Smoking Status Never  Smokeless Tobacco Never   BP Readings from Last 3 Encounters:  11/21/21 128/76  08/03/21 120/78  07/21/21 131/74   Pulse Readings from Last 3 Encounters:  11/21/21 75  08/03/21 69  07/21/21 66   Wt Readings from Last 3 Encounters:  11/21/21 189 lb 6 oz (85.9  kg)  08/03/21 178 lb 8 oz (81 kg)  07/21/21 178 lb (80.7 kg)   BMI Readings from Last 3 Encounters:  11/21/21 33.55 kg/m  08/03/21 31.62 kg/m  07/21/21 31.53 kg/m    Assessment/Interventions: Review of patient past medical history, allergies, medications, health status, including review of consultants reports, laboratory and other test  data, was performed as part of comprehensive evaluation and provision of chronic care management services.   SDOH:  (Social Determinants of Health) assessments and interventions performed: No - done 07/2021  SDOH Screenings   Alcohol Screen: Low Risk  (08/08/2021)   Alcohol Screen    Last Alcohol Screening Score (AUDIT): 0  Depression (PHQ2-9): Medium Risk (08/08/2021)   Depression (PHQ2-9)    PHQ-2 Score: 10  Financial Resource Strain: Low Risk  (08/08/2021)   Overall Financial Resource Strain (CARDIA)    Difficulty of Paying Living Expenses: Not hard at all  Food Insecurity: No Food Insecurity (08/08/2021)   Hunger Vital Sign    Worried About Running Out of Food in the Last Year: Never true    [place] in the Last Year: Never true  Housing: Low Risk  (08/08/2021)   Housing    Last Housing Risk Score: 0  Physical Activity: Inactive (08/08/2021)   Exercise Vital Sign    Days of Exercise per Week: 0 days    Minutes of Exercise per Session: 0 min  Social Connections: Socially Isolated (08/08/2021)   Social Connection and Isolation Panel [NHANES]    Frequency of Communication with Friends and Family: Once a week    Frequency of Social Gatherings with Friends and Family: Twice a week    Attends Religious Services: Never    Marine scientist or Organizations: No    Attends Archivist Meetings: Never    Marital Status: Widowed  Stress: Stress Concern Present (08/08/2021)   [place]    Feeling of Stress : To some extent  Tobacco Use: Low Risk  (11/21/2021)    Patient History    Smoking Tobacco Use: Never    Smokeless Tobacco Use: Never    Passive Exposure: Not on file  Transportation Needs: No Transportation Needs (08/08/2021)   PRAPARE - Transportation    Lack of Transportation (Medical): No    Lack of Transportation (Non-Medical): No    CCM Care Plan  Allergies  Allergen Reactions   Belimumab Nausea And Vomiting and Other (See Comments)    (Benlysta)   Mycophenolate Mofetil Other (See Comments) and Nausea And Vomiting   Phenergan [Promethazine Hcl] Anaphylaxis   Codeine Hives, Itching and Other (See Comments)   Meloxicam Hives   Statins Other (See Comments)    Muscle spasm and cramps   Ezetimibe Other (See Comments)    Muscle spasms   Hydroxyzine Other (See Comments)    Severe nightmares   Tape     Other reaction(s): Other (See Comments) Very thin skin due to chronic steroid use     Medications Reviewed Today     Reviewed by Charlton Haws, [facility] (Pharmacist) on 11/30/21 at [place] List Status: <None>   Medication Order Taking? Sig Documenting Provider Last Dose Status Informant  albuterol (VENTOLIN HFA) 108 (90 Base) MCG/ACT inhaler [id number] Yes Inhale 1-2 puffs into the lungs every 6 (six) hours as needed for wheezing or shortness of breath. Michela Pitcher, NP Taking Active Self  budesonide-formoterol [facility]) 160-4.5 MCG/ACT inhaler [id number] Yes Inhale 2 puffs into the lungs in the morning and at bedtime. [provider] Taking Active Self  Cholecalciferol (VITAMIN D3) 250 MCG (10000 UT) capsule [id number] Yes Take 10,000 Units by mouth daily. [provider] Taking Active Self  clonazePAM (KLONOPIN) 0.5 MG tablet [id number] Yes Take 0.5 mg by mouth 2 (two) times daily as needed.  [provider] Taking Active   furosemide (LASIX) 20 MG tablet [id number] Yes TAKE 1 TABLET BY MOUTH TWICE A DAY Michela Pitcher, NP Taking Active   hydroxychloroquine (PLAQUENIL) 200 MG tablet [id number] Yes Take 400  mg by mouth daily. [provider] Taking Active Self  KRILL OIL PO [id number] Yes Take 1 capsule by mouth daily in the afternoon. [provider] Taking Active Self  levocetirizine (XYZAL) 5 MG tablet [id number] Yes Take 5 mg by mouth daily. [provider] Taking Active   levothyroxine (SYNTHROID) 50 MCG tablet [id number] Yes TAKE 1 TABLET BY MOUTH EVERY DAY BEFORE BREAKFAST  Patient taking differently: Take 50 mcg by mouth daily before breakfast.   Michela Pitcher, NP Taking Active Self  metoprolol succinate (TOPROL-XL) 25 MG 24 hr tablet [id number] Yes TAKE 1 TABLET (25 MG TOTAL) BY MOUTH DAILY. Jearld Fenton, NP Taking Active Self  montelukast (SINGULAIR) 10 MG tablet [id number] Yes Take 10 mg by mouth every morning. [provider] Taking Active   ondansetron (ZOFRAN) 4 MG tablet [id number] Yes Take 1 tablet (4 mg total) by mouth every 8 (eight) hours as needed for nausea or vomiting. Michela Pitcher, NP Taking Active Self           Med Note Jeanie Cooks Mar 21, 2021  9:27 AM) On hand  OXYGEN [id number] Yes Inhale 2.5-3 L into the lungs as needed. [provider] Taking Active Self  pantoprazole (PROTONIX) 40 MG tablet [id number] Yes TAKE 1 TABLET BY MOUTH EVERY DAY Michela Pitcher, NP Taking Active Self  Potassium Chloride (KLOR-CON PO) [id number] Yes Take by mouth. 150 mg- 2 tablets daily [provider] Taking Active   predniSONE (DELTASONE) 10 MG tablet [id number] Yes Take 1 tablet (10 mg total) by mouth as directed. Cuthriell, Charline Bills, PA-C Taking Active   riTUXimab (RITUXAN IV) [id number] Yes Inject into the vein every 6 (six) months. [provider] Taking Active Self  sertraline (ZOLOFT) 100 MG tablet [id number] Yes Take 1 tablet (100 mg total) by mouth daily. Take along with 1 53m tablet to total 1535mtotal CaMichela PitcherNP Taking Active   sertraline (ZOLOFT) 50 MG tablet [id number] TAKE 1 TABLET (50 MG TOTAL) BY  MOUTH DAILY. ALONG WITH 1 100MG TABLET DAILY TO TOTAL 150MG DAILY CaMichela PitcherNP Taking Active   sulfamethoxazole-trimethoprim (BACTRIM DS) 800-160 MG tablet [id number] Take 1 tablet by mouth every Monday, Wednesday, and Friday. 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 (HCV[place]12/23/2022   Otitis externa 11Nov 28, 2022 Thrush 11Nov 28, 2022 Disappearance and death of family member 1111-28-22 Shortness of breath 11November 28, 2022 [facility] ([facility]   Dizziness 11/20/2020   Anxiety 10/07/2019   GERD (gastroesophageal reflux disease) 10/07/2019   SLE (systemic lupus erythematosus related syndrome) (HCSequatch[place]07/10/2019   Chronic respiratory failure ([facility]07/10/2019   Hypercholesterolemia 10/30/2012   DJD (degenerative joint disease) 10/20/2012   HTN (hypertension) 10/20/2012   DM2 (diabetes mellitus, type 2) (HCHeath Springs07/22/2014   Hypothyroidism 10/20/2012   Irritable bowel syndrome 10/20/2012    Immunization History  Administered Date(s) Administered  Influenza, High Dose Seasonal PF 01/27/2020   PFIZER(Purple Top)SARS-COV-2 Vaccination 11/18/2019, 12/09/2019   Pneumococcal Conjugate-13 04/29/2017   Pneumococcal Polysaccharide-23 05/13/2013, 04/25/2016   Tdap 02/25/2013   Zoster, Live 08/18/2013    Conditions to be addressed/monitored:  Hypertension, Hyperlipidemia, and Anxiety, Chronic respiratory failure  Care Plan : [place]  Updates made by Charlton Haws, RPH since 11/30/2021 12:00 AM     Problem: Hypertension, Hyperlipidemia, and Anxiety, Chronic respiratory failure   Priority: High     Long-Range Goal: Disease mgmt   Start Date: 05/30/2021  Expected End Date:  05/31/2022  This Visit's Progress: On track  Recent Progress: On track  Priority: High  Note:   Current Barriers:  Unable to achieve control of hyperlipidemia   Pharmacist Clinical Goal(s):  Patient will achieve adherence to monitoring guidelines and medication adherence to achieve therapeutic efficacy achieve control of hyperlipidemia as evidenced by labwork through collaboration with PharmD and provider.   Interventions: 1:1 collaboration with Michela Pitcher, NP regarding development and update of comprehensive plan of care as evidenced by provider attestation and co-signature Inter-disciplinary care team collaboration (see longitudinal plan of care) Comprehensive medication review performed; medication list updated in electronic medical record  Hypertension (BP goal <140/90) -Controlled - clinic and home readings within goal  -Current home BP readings: none available (pt was in car) -Current treatment: Metoprolol succinate 25 mg daily - Query Appropriate, Effective, Safe, Accessible Furosemide 20 mg BID - Appropriate, Effective, Safe, Accessible -Medications previously tried: nifedipine, lisinopril/HCTZ, quinapril -Counseled to monitor BP at home twice weekly -Recommended to continue current medication  Familial Hyperlipidemia: (LDL goal < 70) -Uncontrolled - LDL 199 (10/2020); pt reports long history of elevated cholesterol since early adulthood, we discussed this is indicative of familial/genetic component; she has not tolerated statins or ezetimibe in the past; she was referred to lipid clinic 10/2020 but never had appt due to being busy with other things, she is now open to scheduling an appt -Current treatment: No pharmacotherapy -Medications previously tried: statins, ezetimibe   -Educated on Cholesterol goals; discussed benefits of PCSK9-inhibitor particularly for familial hypercholesterolemia; pt agrees to start PCSK9-I however she cannot afford the copay right now, once  [employer] opens and she can enroll she will start [place]. Pharmacy team will monitor [place] and enroll patient when it is open.  Chronic Respiratory Failure (Goal: control symptoms and prevent exacerbations) -Controlled - per pt report; Followed by pulmonology for ILD/lupus -Current treatment  Symbicort 2 puffs twice daily - Appropriate, Effective, Safe, Accessible Albuterol Prn - Appropriate, Effective, Safe, Accessible Oxygen - wears at night and PRN during day -Medications previously tried: none reported -Exacerbations requiring treatment in last 6 months: none  -Patient reports consistent use of maintenance inhaler -Frequency of rescue inhaler use: depends on the weather, maybe twice/month  -Recommended to continue current medication; Follow with [facility]  Anxiety/Insomnia (Goal: Control symptoms) -Improved - pt endorses compliance with meds; pt has requested alternative sleep medications before but did not request this today -PHQ9: 0 (04/2020) - minimal depression -GAD7: 2 (04/2020) - minimal anxiety -Follows with PCP and NP Dutch Quint for mental health support -Current treatment  Sertraline 100 mg daily - Appropriate, Effective, Safe, Accessible Buspirone 5 mg HS prn - Appropriate, Effective, Safe, Accessible -Medications previously tried: none -Recommended continue current medications  Patient Goals/Self-Care Activities Patient will:  - take medications as prescribed as evidenced by patient report and record review focus on medication adherence by routine check blood  pressure 2x weekly, document, and provide at future appointments        Medication Assistance: None required.  Patient affirms current coverage meets needs.  Compliance/Adherence/Medication fill history: Care Gaps: Foot exam Eye exam Urine microalbumin - done @ [facility] 11/16/21. MA/Cr < 63 Mammogram (due 10/29/14) DEXA scan   Star-Rating Drugs: None  Medication Access: Within  the past 30 days, how often has patient missed a dose of medication? 0 Is a pillbox or other method used to improve adherence? Yes  Factors that may affect medication adherence? no barriers identified Are meds synced by current pharmacy? No  Are meds delivered by current pharmacy? No  Does patient experience delays in picking up medications due to transportation concerns? No   [facility] Services Reviewed: Is patient disadvantaged to use [facility]?: No  Current Rx insurance plan: [employer] Name and location of Current pharmacy:  [id number] [place] [place]Phone: [phone]: [phone] [facility] services reviewed with patient today?: No  Patient requests to transfer care to [facility]?: No  Reason patient declined to change pharmacies: Not mentioned at this visit   Care Plan and Follow Up Patient Decision:  Patient agrees to Care Plan and Follow-up.  Plan: Telephone follow up appointment with care management team member scheduled for:  4 months  LCharlene Brooke PharmD, BCACP Clinical Pharmacist [phone]

## 2021-11-30 NOTE — Patient Instructions (Signed)
Visit Information  Phone number for Pharmacist: [phone]   Goals Addressed   None     Care Plan : [place]  Updates made by Charlton Haws, [place] since 11/30/2021 12:00 AM     Problem: Hypertension, Hyperlipidemia, and Anxiety, Chronic respiratory failure   Priority: High     Long-Range Goal: Disease mgmt   Start Date: 05/30/2021  Expected End Date: 05/31/2022  This Visit's Progress: On track  Recent Progress: On track  Priority: High  Note:   Current Barriers:  Unable to achieve control of hyperlipidemia   Pharmacist Clinical Goal(s):  Patient will achieve adherence to monitoring guidelines and medication adherence to achieve therapeutic efficacy achieve control of hyperlipidemia as evidenced by labwork through collaboration with PharmD and provider.   Interventions: 1:1 collaboration with Michela Pitcher, NP regarding development and update of comprehensive plan of care as evidenced by provider attestation and co-signature Inter-disciplinary care team collaboration (see longitudinal plan of care) Comprehensive medication review performed; medication list updated in electronic medical record  Hypertension (BP goal <140/90) -Controlled - clinic and home readings within goal  -Current home BP readings: none available (pt was in car) -Current treatment: Metoprolol succinate 25 mg daily - Query Appropriate, Effective, Safe, Accessible Furosemide 20 mg BID - Appropriate, Effective, Safe, Accessible -Medications previously tried: nifedipine, lisinopril/HCTZ, quinapril -Counseled to monitor BP at home twice weekly -Recommended to continue current medication  Familial Hyperlipidemia: (LDL goal < 70) -Uncontrolled - LDL 199 (10/2020); pt reports long history of elevated cholesterol since early adulthood, we discussed this is indicative of familial/genetic component; she has not tolerated statins or ezetimibe in the past; she was referred to lipid clinic 10/2020 but  never had appt due to being busy with other things, she is now open to scheduling an appt -Current treatment: No pharmacotherapy -Medications previously tried: statins, ezetimibe   -Educated on Cholesterol goals; discussed benefits of PCSK9-inhibitor particularly for familial hypercholesterolemia; pt agrees to start PCSK9-I however she cannot afford the copay right now, once [employer] opens and she can enroll she will start [place]. Pharmacy team will monitor [employer] and enroll patient when it is open.  Chronic Respiratory Failure (Goal: control symptoms and prevent exacerbations) -Controlled - per pt report; Followed by pulmonology for ILD/lupus -Current treatment  Symbicort 2 puffs twice daily - Appropriate, Effective, Safe, Accessible Albuterol Prn - Appropriate, Effective, Safe, Accessible Oxygen - wears at night and PRN during day -Medications previously tried: none reported -Exacerbations requiring treatment in last 6 months: none  -Patient reports consistent use of maintenance inhaler -Frequency of rescue inhaler use: depends on the weather, maybe twice/month  -Recommended to continue current medication; Follow with [facility] pulmonology  Anxiety/Insomnia (Goal: Control symptoms) -Improved - pt endorses compliance with meds; pt has requested alternative sleep medications before but did not request this today -PHQ9: 0 (04/2020) - minimal depression -GAD7: 2 (04/2020) - minimal anxiety -Follows with PCP and NP Dutch Quint for mental health support -Current treatment  Sertraline 100 mg daily - Appropriate, Effective, Safe, Accessible Buspirone 5 mg HS prn - Appropriate, Effective, Safe, Accessible -Medications previously tried: none -Recommended continue current medications  Patient Goals/Self-Care Activities Patient will:  - take medications as prescribed as evidenced by patient report and record review focus on medication adherence by routine check blood pressure 2x  weekly, document, and provide at future appointments       Patient verbalizes understanding of instructions and care plan provided today and agrees to view in  MyChart. Active MyChart status and patient understanding of how to access instructions and care plan via MyChart confirmed with patient.    Telephone follow up appointment with pharmacy team member scheduled for: 4 months  Charlene Brooke, PharmD, [facility] Clinical Pharmacist [facility] [phone]

## 2021-12-05 ENCOUNTER — Telehealth: Payer: Medicare HMO

## 2021-12-16 ENCOUNTER — Other Ambulatory Visit: Payer: Self-pay | Admitting: Nurse Practitioner

## 2021-12-16 DIAGNOSIS — E039 Hypothyroidism, unspecified: Secondary | ICD-10-CM

## 2021-12-18 ENCOUNTER — Telehealth (INDEPENDENT_AMBULATORY_CARE_PROVIDER_SITE_OTHER): Payer: Medicare HMO | Admitting: Family Medicine

## 2021-12-18 ENCOUNTER — Telehealth: Payer: Self-pay | Admitting: Primary Care

## 2021-12-18 ENCOUNTER — Encounter: Payer: Self-pay | Admitting: Family Medicine

## 2021-12-18 ENCOUNTER — Other Ambulatory Visit: Payer: Self-pay | Admitting: Primary Care

## 2021-12-18 VITALS — HR 60

## 2021-12-18 DIAGNOSIS — J9610 Chronic respiratory failure, unspecified whether with hypoxia or hypercapnia: Secondary | ICD-10-CM

## 2021-12-18 DIAGNOSIS — U071 COVID-19: Secondary | ICD-10-CM

## 2021-12-18 DIAGNOSIS — Z20822 Contact with and (suspected) exposure to covid-19: Secondary | ICD-10-CM

## 2021-12-18 LAB — POC COVID19 BINAXNOW: SARS Coronavirus 2 Ag: POSITIVE — AB

## 2021-12-18 MED ORDER — MOLNUPIRAVIR EUA 200MG CAPSULE: 4.0000 | ORAL_CAPSULE | Freq: Two times a day (BID) | ORAL | 0 refills | Status: AC

## 2021-12-18 NOTE — Assessment & Plan Note (Addendum)
Stable currently.  Strict ER precautions as well as pulmonology for precautions.  Patient is using normal baseline amount with SPO2 remaining greater than 88%.  Offered x-ray, but she will wait and follow-up with pulmonology if her breathing worsens.  OK to increase oxygen but if she does so she should reach out to pulmonology and if severe shortness of breath she should go to the ER.

## 2021-12-18 NOTE — Telephone Encounter (Signed)
Left message to call back  

## 2021-12-18 NOTE — Patient Instructions (Signed)
Isolate though 9/50/7225 Mask if in public 7/50-5/18/3358 Ok to stop masking as long as improving on 12/25/21

## 2021-12-18 NOTE — Telephone Encounter (Signed)
Patient tested positive for COVID-19 today.  I believe that she is already aware.  Reviewed office notes from Dr. Einar Pheasant from earlier today. We will treat her COVID-19 symptoms with molnupiravir, antiviral treatment for COVID infection.  The medication will come in a pill pack, she will take 4 capsules twice daily for 5 days.  She will need to go to the hospital if symptoms worsen as Dr. Einar Pheasant mention today.

## 2021-12-18 NOTE — Progress Notes (Signed)
I connected with Jerita Wimbush on 12/18/21 at 11:20 AM EDT by video and verified that I am speaking with the correct person using two identifiers.   I discussed the limitations, risks, security and privacy concerns of performing an evaluation and management service by video and the availability of in person appointments. I also discussed with the patient that there may be a patient responsible charge related to this service. The patient expressed understanding and agreed to proceed.  Patient location: Home Provider Location: [place] Participants: Lesleigh Noe and Rivky Clendenning   Subjective:     Dana Doyle is a 71 y.o. female presenting for Sinusitis (Headache, body aches, cough x 3 days. Diarrhea started this morning. No covid testing done. May be able to come fro drive up testing this afternoon once dtr gets home. )     Sinusitis    #Sinus symptoms - pt with chronic O2 - currently on 2.5L and sating 89% and typically is on 2.5-3L which see adjusts  - is on concentrator and is on 3L - tries to keep it at 2.5L and use only as needed - usually keeps O2 around 90-92% - cough is not productive - no sore throat - does have drainage - started with sinus symptoms on 9/15 - worse on Sunday with body aches - treatment - mucinex and allergy treatment - diarrhea this morning - 4 BM this morning - no covid testing - hx of covid x 2 - both times had negative home tests and positive hospital tests - endorses severe headache and sinus pressure and pain - achy x 3 weeks  - has lupus - sees someone at [facility] - pulmonologist -    Review of Systems   Social History   Tobacco Use  Smoking Status Never  Smokeless Tobacco Never        Objective:   BP Readings from Last 3 Encounters:  11/21/21 128/76  08/03/21 120/78  07/21/21 131/74   Wt Readings from Last 3 Encounters:  11/21/21 189 lb 6 oz (85.9 kg)  08/03/21 178 lb 8 oz (81 kg)  07/21/21  178 lb (80.7 kg)    Pulse 60   SpO2 (!) 89%   Physical Exam Constitutional:      Appearance: Normal appearance. She is not ill-appearing.  HENT:     Head: Normocephalic and atraumatic.     Right Ear: External ear normal.     Left Ear: External ear normal.  Eyes:     Conjunctiva/sclera: Conjunctivae normal.  Pulmonary:     Effort: Pulmonary effort is normal. No respiratory distress.  Neurological:     Mental Status: She is alert. Mental status is at baseline.  Psychiatric:        Mood and Affect: Mood normal.        Behavior: Behavior normal.        Thought Content: Thought content normal.        Judgment: Judgment normal.            Assessment & Plan:   Problem List Items Addressed This Visit       Respiratory   Chronic respiratory failure ([place])    Stable currently.  Strict ER precautions as well as pulmonology for precautions.  Patient is using normal baseline amount with SPO2 remaining greater than 88%.  Offered x-ray, but she will wait and follow-up with pulmonology if her breathing worsens.  OK to increase oxygen but if she does  so she should reach out to pulmonology and if severe shortness of breath she should go to the ER.      Other Visit Diagnoses     Suspected COVID-19 virus infection    -  Primary   Relevant Orders   POC COVID-19 BinaxNow      Suspect COVID versus sinus infection.  Visit is virtual, patient will return this afternoon for COVID testing.  Patient is at increased risk for developing severe covid due to chronic respiratory failure, DM, SLE, HTN, immunosuppressants. They are eligible for anti-viral medication.   Discussed molnupiravir if covid positive. If covid testing is negative will treat with Augmentin 875/125 bid for 7 days for likely sinus infection   Medication sent to pharmacy  Reviewed ER and return precautions  Reviewed isolation guidelines.    No follow-ups on file.  Lesleigh Noe, MD

## 2021-12-18 NOTE — Telephone Encounter (Signed)
Patient advised and verbalized understanding 

## 2021-12-18 NOTE — Addendum Note (Signed)
Addended by: Loreen Freud on: 12/18/2021 03:14 PM   Modules accepted: Orders

## 2021-12-19 NOTE — Telephone Encounter (Signed)
Noted, appreciate Tawni Millers help in reviewing results.

## 2021-12-20 ENCOUNTER — Other Ambulatory Visit: Payer: Self-pay | Admitting: Nurse Practitioner

## 2021-12-20 DIAGNOSIS — F419 Anxiety disorder, unspecified: Secondary | ICD-10-CM

## 2021-12-20 NOTE — Telephone Encounter (Signed)
Can we determine dosage of sertraline? Is she taking both 50 and 100 for 150 total ? Or was she increased from 50 to 100 mg?

## 2021-12-20 NOTE — Telephone Encounter (Signed)
She takes 50 mg and 100 mg for total of 150 mg -she takes them separate due to issues with cutting pills in half.

## 2021-12-29 DIAGNOSIS — J9691 Respiratory failure, unspecified with hypoxia: Secondary | ICD-10-CM | POA: Diagnosis not present

## 2021-12-29 DIAGNOSIS — J1001 Influenza due to other identified influenza virus with the same other identified influenza virus pneumonia: Secondary | ICD-10-CM | POA: Diagnosis not present

## 2021-12-29 DIAGNOSIS — E039 Hypothyroidism, unspecified: Secondary | ICD-10-CM | POA: Diagnosis not present

## 2021-12-29 DIAGNOSIS — J1008 Influenza due to other identified influenza virus with other specified pneumonia: Secondary | ICD-10-CM | POA: Diagnosis not present

## 2021-12-29 DIAGNOSIS — Z4659 Encounter for fitting and adjustment of other gastrointestinal appliance and device: Secondary | ICD-10-CM | POA: Diagnosis not present

## 2021-12-29 DIAGNOSIS — R5381 Other malaise: Secondary | ICD-10-CM | POA: Diagnosis not present

## 2021-12-29 DIAGNOSIS — J9621 Acute and chronic respiratory failure with hypoxia: Secondary | ICD-10-CM | POA: Diagnosis not present

## 2021-12-29 DIAGNOSIS — N179 Acute kidney failure, unspecified: Secondary | ICD-10-CM | POA: Diagnosis not present

## 2021-12-29 DIAGNOSIS — R000 Tachycardia, unspecified: Secondary | ICD-10-CM | POA: Diagnosis not present

## 2021-12-29 DIAGNOSIS — J9600 Acute respiratory failure, unspecified whether with hypoxia or hypercapnia: Secondary | ICD-10-CM | POA: Diagnosis not present

## 2021-12-29 DIAGNOSIS — R0602 Shortness of breath: Secondary | ICD-10-CM | POA: Diagnosis not present

## 2021-12-29 DIAGNOSIS — J470 Bronchiectasis with acute lower respiratory infection: Secondary | ICD-10-CM | POA: Diagnosis not present

## 2021-12-29 DIAGNOSIS — J80 Acute respiratory distress syndrome: Secondary | ICD-10-CM | POA: Diagnosis not present

## 2021-12-29 DIAGNOSIS — J449 Chronic obstructive pulmonary disease, unspecified: Secondary | ICD-10-CM | POA: Diagnosis not present

## 2021-12-29 DIAGNOSIS — J159 Unspecified bacterial pneumonia: Secondary | ICD-10-CM | POA: Diagnosis not present

## 2021-12-29 DIAGNOSIS — J84114 Acute interstitial pneumonitis: Secondary | ICD-10-CM | POA: Diagnosis not present

## 2021-12-29 DIAGNOSIS — J90 Pleural effusion, not elsewhere classified: Secondary | ICD-10-CM | POA: Diagnosis not present

## 2021-12-29 DIAGNOSIS — R9431 Abnormal electrocardiogram [ECG] [EKG]: Secondary | ICD-10-CM | POA: Diagnosis not present

## 2021-12-29 DIAGNOSIS — Z9911 Dependence on respirator [ventilator] status: Secondary | ICD-10-CM | POA: Diagnosis not present

## 2021-12-29 DIAGNOSIS — F32A Depression, unspecified: Secondary | ICD-10-CM | POA: Diagnosis not present

## 2021-12-29 DIAGNOSIS — J849 Interstitial pulmonary disease, unspecified: Secondary | ICD-10-CM | POA: Diagnosis not present

## 2021-12-29 DIAGNOSIS — U071 COVID-19: Secondary | ICD-10-CM | POA: Diagnosis not present

## 2021-12-29 DIAGNOSIS — R5383 Other fatigue: Secondary | ICD-10-CM | POA: Diagnosis not present

## 2021-12-29 DIAGNOSIS — E871 Hypo-osmolality and hyponatremia: Secondary | ICD-10-CM | POA: Diagnosis not present

## 2021-12-29 DIAGNOSIS — E874 Mixed disorder of acid-base balance: Secondary | ICD-10-CM | POA: Diagnosis not present

## 2021-12-29 DIAGNOSIS — J9622 Acute and chronic respiratory failure with hypercapnia: Secondary | ICD-10-CM | POA: Diagnosis not present

## 2021-12-29 DIAGNOSIS — J9601 Acute respiratory failure with hypoxia: Secondary | ICD-10-CM | POA: Diagnosis not present

## 2021-12-29 DIAGNOSIS — D84821 Immunodeficiency due to drugs: Secondary | ICD-10-CM | POA: Diagnosis not present

## 2021-12-29 DIAGNOSIS — R918 Other nonspecific abnormal finding of lung field: Secondary | ICD-10-CM | POA: Diagnosis not present

## 2021-12-29 DIAGNOSIS — I10 Essential (primary) hypertension: Secondary | ICD-10-CM

## 2021-12-29 DIAGNOSIS — J9690 Respiratory failure, unspecified, unspecified whether with hypoxia or hypercapnia: Secondary | ICD-10-CM | POA: Diagnosis not present

## 2021-12-29 DIAGNOSIS — R059 Cough, unspecified: Secondary | ICD-10-CM | POA: Diagnosis not present

## 2021-12-29 DIAGNOSIS — R739 Hyperglycemia, unspecified: Secondary | ICD-10-CM | POA: Diagnosis not present

## 2021-12-29 DIAGNOSIS — J101 Influenza due to other identified influenza virus with other respiratory manifestations: Secondary | ICD-10-CM | POA: Diagnosis not present

## 2021-12-29 DIAGNOSIS — E860 Dehydration: Secondary | ICD-10-CM | POA: Diagnosis not present

## 2021-12-29 DIAGNOSIS — E785 Hyperlipidemia, unspecified: Secondary | ICD-10-CM | POA: Diagnosis not present

## 2021-12-29 DIAGNOSIS — M329 Systemic lupus erythematosus, unspecified: Secondary | ICD-10-CM | POA: Diagnosis not present

## 2021-12-29 DIAGNOSIS — I959 Hypotension, unspecified: Secondary | ICD-10-CM | POA: Diagnosis not present

## 2021-12-29 DIAGNOSIS — Z20822 Contact with and (suspected) exposure to covid-19: Secondary | ICD-10-CM | POA: Diagnosis not present

## 2021-12-29 DIAGNOSIS — R001 Bradycardia, unspecified: Secondary | ICD-10-CM | POA: Diagnosis not present

## 2021-12-29 DIAGNOSIS — R296 Repeated falls: Secondary | ICD-10-CM | POA: Diagnosis not present

## 2021-12-29 DIAGNOSIS — R0902 Hypoxemia: Secondary | ICD-10-CM | POA: Diagnosis not present

## 2021-12-29 DIAGNOSIS — J1189 Influenza due to unidentified influenza virus with other manifestations: Secondary | ICD-10-CM | POA: Diagnosis not present

## 2021-12-29 DIAGNOSIS — I517 Cardiomegaly: Secondary | ICD-10-CM | POA: Diagnosis not present

## 2021-12-29 DIAGNOSIS — J8489 Other specified interstitial pulmonary diseases: Secondary | ICD-10-CM | POA: Diagnosis not present

## 2021-12-29 DIAGNOSIS — J181 Lobar pneumonia, unspecified organism: Secondary | ICD-10-CM | POA: Diagnosis not present

## 2021-12-29 DIAGNOSIS — R633 Feeding difficulties, unspecified: Secondary | ICD-10-CM | POA: Diagnosis not present

## 2021-12-29 DIAGNOSIS — K219 Gastro-esophageal reflux disease without esophagitis: Secondary | ICD-10-CM | POA: Diagnosis not present

## 2021-12-29 DIAGNOSIS — J189 Pneumonia, unspecified organism: Secondary | ICD-10-CM | POA: Diagnosis not present

## 2021-12-29 DIAGNOSIS — Z9989 Dependence on other enabling machines and devices: Secondary | ICD-10-CM | POA: Diagnosis not present

## 2021-12-29 DIAGNOSIS — I444 Left anterior fascicular block: Secondary | ICD-10-CM | POA: Diagnosis not present

## 2021-12-29 DIAGNOSIS — B349 Viral infection, unspecified: Secondary | ICD-10-CM | POA: Diagnosis not present

## 2021-12-29 DIAGNOSIS — I272 Pulmonary hypertension, unspecified: Secondary | ICD-10-CM | POA: Diagnosis not present

## 2021-12-30 DIAGNOSIS — N179 Acute kidney failure, unspecified: Secondary | ICD-10-CM | POA: Diagnosis not present

## 2021-12-30 DIAGNOSIS — E039 Hypothyroidism, unspecified: Secondary | ICD-10-CM | POA: Diagnosis not present

## 2021-12-30 DIAGNOSIS — J189 Pneumonia, unspecified organism: Secondary | ICD-10-CM | POA: Diagnosis not present

## 2021-12-30 DIAGNOSIS — F32A Depression, unspecified: Secondary | ICD-10-CM | POA: Diagnosis not present

## 2021-12-30 DIAGNOSIS — J9601 Acute respiratory failure with hypoxia: Secondary | ICD-10-CM | POA: Diagnosis not present

## 2021-12-30 DIAGNOSIS — K219 Gastro-esophageal reflux disease without esophagitis: Secondary | ICD-10-CM | POA: Diagnosis not present

## 2021-12-30 DIAGNOSIS — I10 Essential (primary) hypertension: Secondary | ICD-10-CM | POA: Diagnosis not present

## 2021-12-30 DIAGNOSIS — M329 Systemic lupus erythematosus, unspecified: Secondary | ICD-10-CM | POA: Diagnosis not present

## 2021-12-31 DIAGNOSIS — N179 Acute kidney failure, unspecified: Secondary | ICD-10-CM | POA: Diagnosis not present

## 2021-12-31 DIAGNOSIS — M329 Systemic lupus erythematosus, unspecified: Secondary | ICD-10-CM | POA: Diagnosis not present

## 2021-12-31 DIAGNOSIS — F32A Depression, unspecified: Secondary | ICD-10-CM | POA: Diagnosis not present

## 2021-12-31 DIAGNOSIS — J189 Pneumonia, unspecified organism: Secondary | ICD-10-CM | POA: Diagnosis not present

## 2021-12-31 DIAGNOSIS — E039 Hypothyroidism, unspecified: Secondary | ICD-10-CM | POA: Diagnosis not present

## 2021-12-31 DIAGNOSIS — I10 Essential (primary) hypertension: Secondary | ICD-10-CM | POA: Diagnosis not present

## 2021-12-31 DIAGNOSIS — J9601 Acute respiratory failure with hypoxia: Secondary | ICD-10-CM | POA: Diagnosis not present

## 2021-12-31 DIAGNOSIS — K219 Gastro-esophageal reflux disease without esophagitis: Secondary | ICD-10-CM | POA: Diagnosis not present

## 2022-01-01 DIAGNOSIS — K219 Gastro-esophageal reflux disease without esophagitis: Secondary | ICD-10-CM | POA: Diagnosis not present

## 2022-01-01 DIAGNOSIS — J101 Influenza due to other identified influenza virus with other respiratory manifestations: Secondary | ICD-10-CM | POA: Diagnosis not present

## 2022-01-01 DIAGNOSIS — N179 Acute kidney failure, unspecified: Secondary | ICD-10-CM | POA: Diagnosis not present

## 2022-01-01 DIAGNOSIS — E860 Dehydration: Secondary | ICD-10-CM | POA: Diagnosis not present

## 2022-01-01 DIAGNOSIS — R0902 Hypoxemia: Secondary | ICD-10-CM | POA: Diagnosis not present

## 2022-01-01 DIAGNOSIS — J1189 Influenza due to unidentified influenza virus with other manifestations: Secondary | ICD-10-CM | POA: Diagnosis not present

## 2022-01-01 DIAGNOSIS — J9621 Acute and chronic respiratory failure with hypoxia: Secondary | ICD-10-CM | POA: Diagnosis not present

## 2022-01-01 DIAGNOSIS — F32A Depression, unspecified: Secondary | ICD-10-CM | POA: Diagnosis not present

## 2022-01-01 DIAGNOSIS — U071 COVID-19: Secondary | ICD-10-CM | POA: Diagnosis not present

## 2022-01-01 DIAGNOSIS — M329 Systemic lupus erythematosus, unspecified: Secondary | ICD-10-CM | POA: Diagnosis not present

## 2022-01-01 DIAGNOSIS — J9691 Respiratory failure, unspecified with hypoxia: Secondary | ICD-10-CM | POA: Diagnosis not present

## 2022-01-01 DIAGNOSIS — J189 Pneumonia, unspecified organism: Secondary | ICD-10-CM | POA: Diagnosis not present

## 2022-01-01 DIAGNOSIS — E039 Hypothyroidism, unspecified: Secondary | ICD-10-CM | POA: Diagnosis not present

## 2022-01-01 DIAGNOSIS — I10 Essential (primary) hypertension: Secondary | ICD-10-CM | POA: Diagnosis not present

## 2022-01-01 DIAGNOSIS — J9601 Acute respiratory failure with hypoxia: Secondary | ICD-10-CM | POA: Diagnosis not present

## 2022-01-02 DIAGNOSIS — J9601 Acute respiratory failure with hypoxia: Secondary | ICD-10-CM | POA: Diagnosis not present

## 2022-01-02 DIAGNOSIS — M329 Systemic lupus erythematosus, unspecified: Secondary | ICD-10-CM | POA: Diagnosis not present

## 2022-01-02 DIAGNOSIS — R0902 Hypoxemia: Secondary | ICD-10-CM | POA: Diagnosis not present

## 2022-01-02 DIAGNOSIS — N179 Acute kidney failure, unspecified: Secondary | ICD-10-CM | POA: Diagnosis not present

## 2022-01-02 DIAGNOSIS — J189 Pneumonia, unspecified organism: Secondary | ICD-10-CM | POA: Diagnosis not present

## 2022-01-03 DIAGNOSIS — R0602 Shortness of breath: Secondary | ICD-10-CM | POA: Diagnosis not present

## 2022-01-03 DIAGNOSIS — R0902 Hypoxemia: Secondary | ICD-10-CM | POA: Diagnosis not present

## 2022-01-03 DIAGNOSIS — J9621 Acute and chronic respiratory failure with hypoxia: Secondary | ICD-10-CM | POA: Diagnosis not present

## 2022-01-03 DIAGNOSIS — J189 Pneumonia, unspecified organism: Secondary | ICD-10-CM | POA: Diagnosis not present

## 2022-01-03 DIAGNOSIS — M329 Systemic lupus erythematosus, unspecified: Secondary | ICD-10-CM | POA: Diagnosis not present

## 2022-01-03 DIAGNOSIS — J84114 Acute interstitial pneumonitis: Secondary | ICD-10-CM | POA: Diagnosis not present

## 2022-01-03 DIAGNOSIS — N179 Acute kidney failure, unspecified: Secondary | ICD-10-CM | POA: Diagnosis not present

## 2022-01-03 DIAGNOSIS — J9690 Respiratory failure, unspecified, unspecified whether with hypoxia or hypercapnia: Secondary | ICD-10-CM | POA: Diagnosis not present

## 2022-01-03 DIAGNOSIS — J1001 Influenza due to other identified influenza virus with the same other identified influenza virus pneumonia: Secondary | ICD-10-CM | POA: Diagnosis not present

## 2022-01-03 DIAGNOSIS — J9601 Acute respiratory failure with hypoxia: Secondary | ICD-10-CM | POA: Diagnosis not present

## 2022-01-04 DIAGNOSIS — J9621 Acute and chronic respiratory failure with hypoxia: Secondary | ICD-10-CM | POA: Diagnosis not present

## 2022-01-04 DIAGNOSIS — J84114 Acute interstitial pneumonitis: Secondary | ICD-10-CM | POA: Diagnosis not present

## 2022-01-04 DIAGNOSIS — J159 Unspecified bacterial pneumonia: Secondary | ICD-10-CM | POA: Diagnosis not present

## 2022-01-04 DIAGNOSIS — J849 Interstitial pulmonary disease, unspecified: Secondary | ICD-10-CM | POA: Diagnosis not present

## 2022-01-04 DIAGNOSIS — J1001 Influenza due to other identified influenza virus with the same other identified influenza virus pneumonia: Secondary | ICD-10-CM | POA: Diagnosis not present

## 2022-01-05 DIAGNOSIS — J159 Unspecified bacterial pneumonia: Secondary | ICD-10-CM | POA: Diagnosis not present

## 2022-01-05 DIAGNOSIS — J9621 Acute and chronic respiratory failure with hypoxia: Secondary | ICD-10-CM | POA: Diagnosis not present

## 2022-01-05 DIAGNOSIS — J849 Interstitial pulmonary disease, unspecified: Secondary | ICD-10-CM | POA: Diagnosis not present

## 2022-01-06 DIAGNOSIS — J849 Interstitial pulmonary disease, unspecified: Secondary | ICD-10-CM | POA: Diagnosis not present

## 2022-01-06 DIAGNOSIS — J9621 Acute and chronic respiratory failure with hypoxia: Secondary | ICD-10-CM | POA: Diagnosis not present

## 2022-01-06 DIAGNOSIS — J159 Unspecified bacterial pneumonia: Secondary | ICD-10-CM | POA: Diagnosis not present

## 2022-01-18 ENCOUNTER — Telehealth: Payer: Self-pay | Admitting: Nurse Practitioner

## 2022-01-18 NOTE — Telephone Encounter (Signed)
Called [place] and left message with our condolences and let her know Catalina Antigua will get this message next week.

## 2022-01-18 NOTE — Telephone Encounter (Signed)
Patients daughter called in to let Catalina Antigua know that patient passed away on 02-08-2022.

## 2022-01-30 DEATH — deceased

## 2022-02-08 ENCOUNTER — Ambulatory Visit: Payer: Medicare HMO | Admitting: Cardiology

## 2022-04-05 ENCOUNTER — Telehealth: Payer: Medicare HMO

## 2022-06-18 IMAGING — CT CT CERVICAL SPINE W/O CM
3 of 4 series · 11 of 33 positions shown, 13 images · non-contrast
Comparison: None.

CLINICAL DATA: Polytrauma, blunt.  Fall.



[Series 7: orthogonal axials · axial · 0.21mm/px · z∈[-222,-126]mm · 3 of 87 slices shown, 4 images]
[im 15/87  soft-tissue]
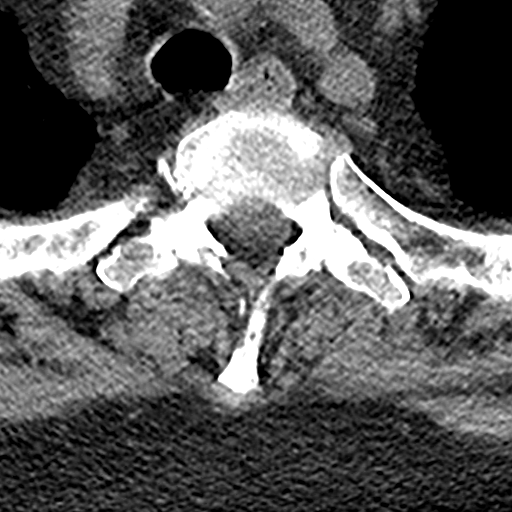
[im 15/87  bone]
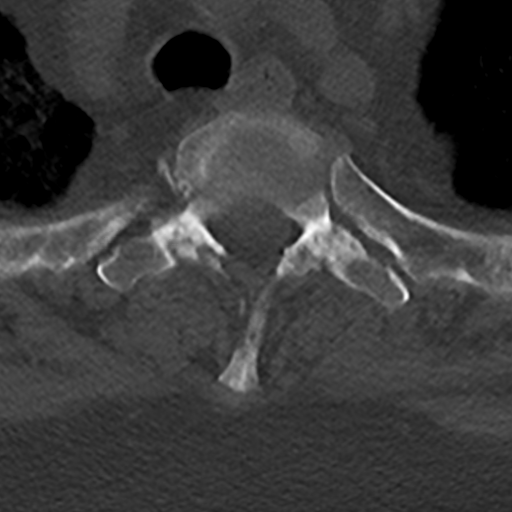
[im 44/87  bone]
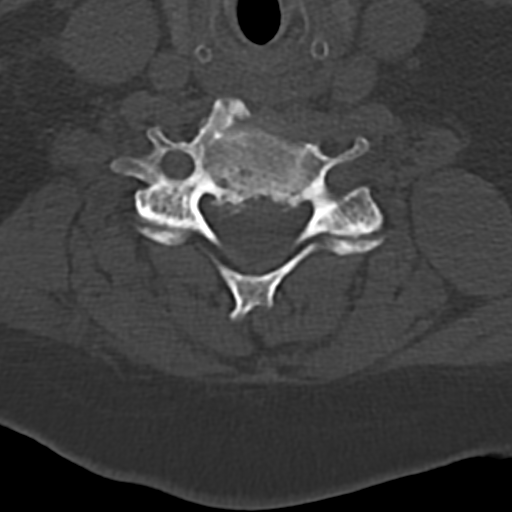
[im 72/87  bone]
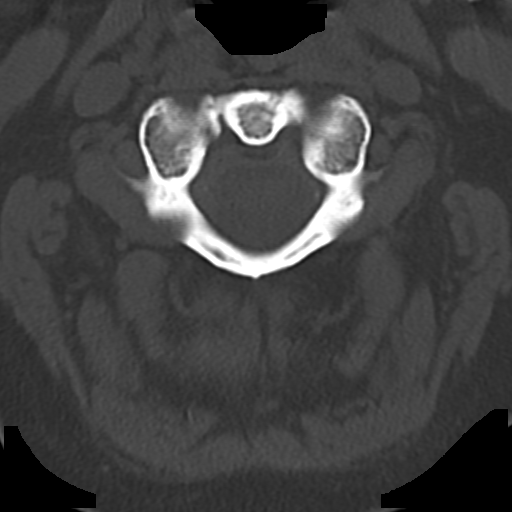

[Series 8: sag bone · sagittal · 0.26mm/px · 5 of 74 slices shown, 6 images]
[im 25/74  bone]
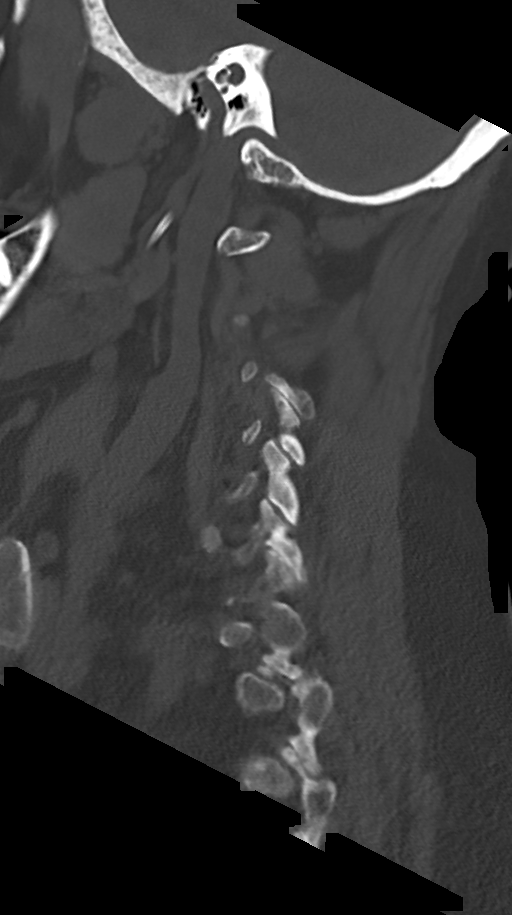
[im 31/74  bone]
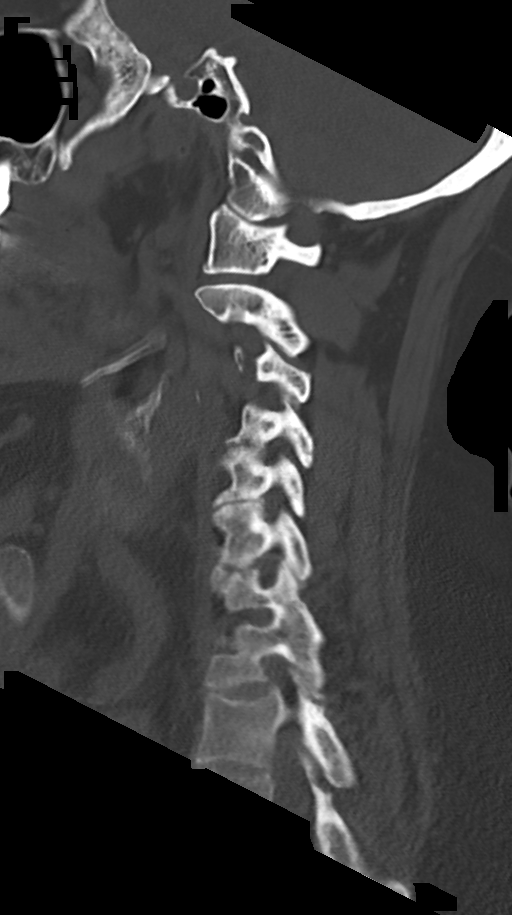
[im 37/74  soft-tissue]
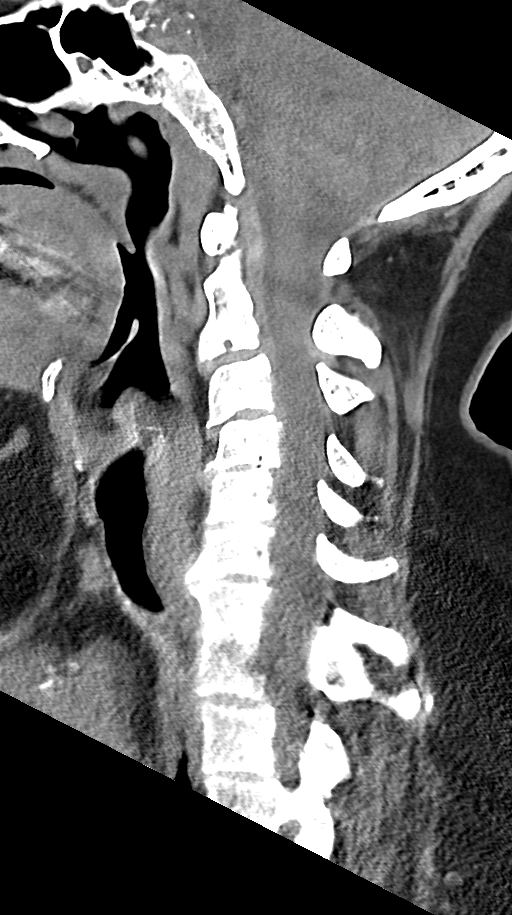
[im 37/74  bone]
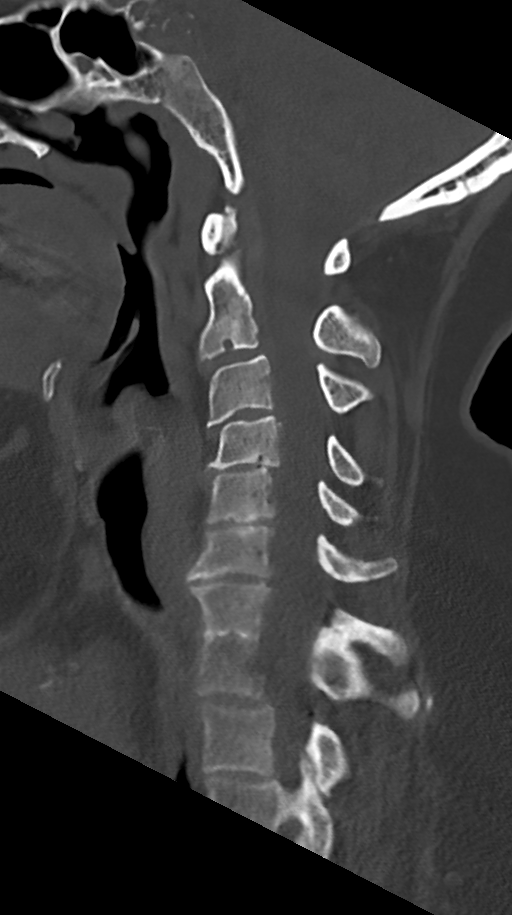
[im 43/74  bone]
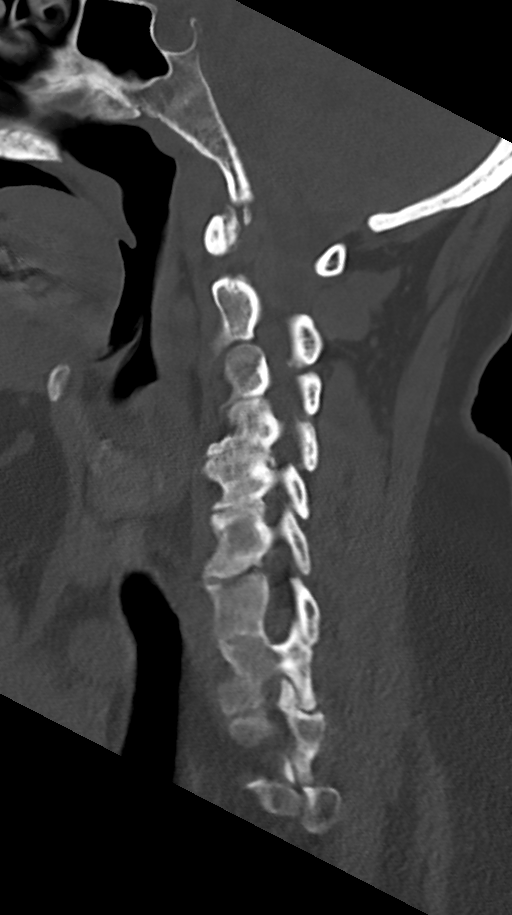
[im 49/74  bone]
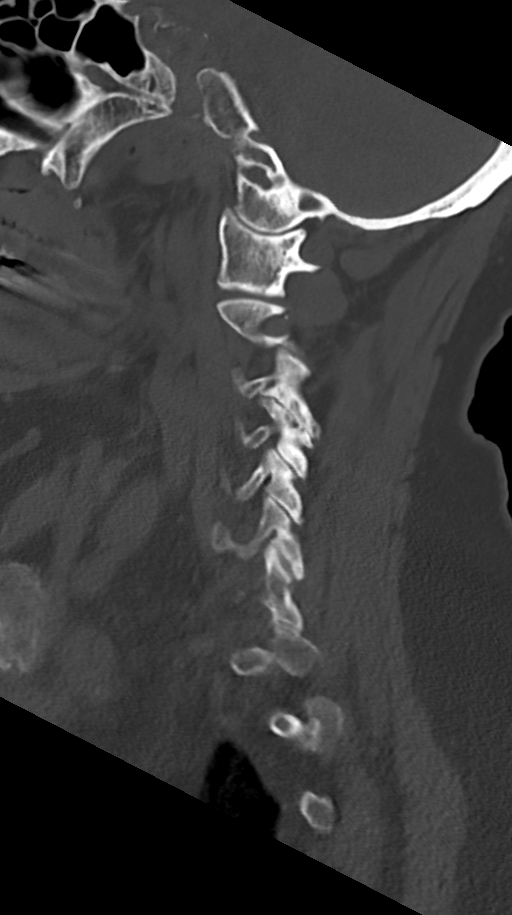

[Series 9: cor bone · coronal · 0.29mm/px · 3 of 64 slices shown]
[im 16/64  bone]
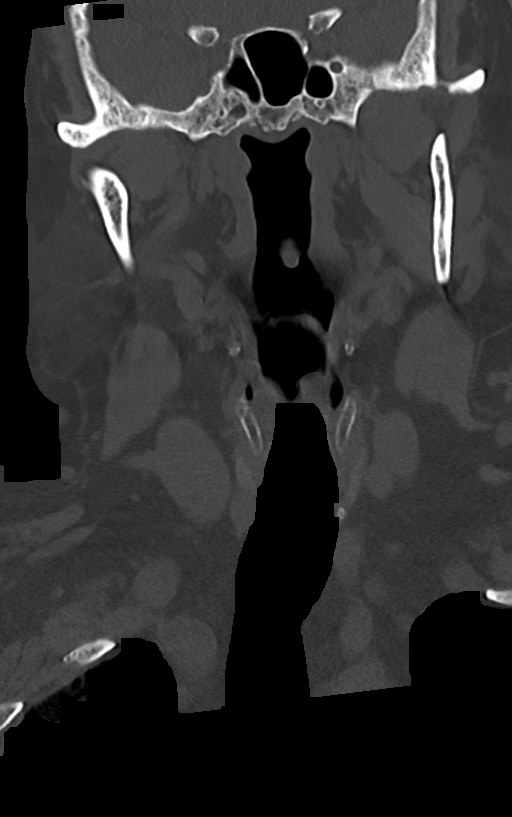
[im 27/64  bone]
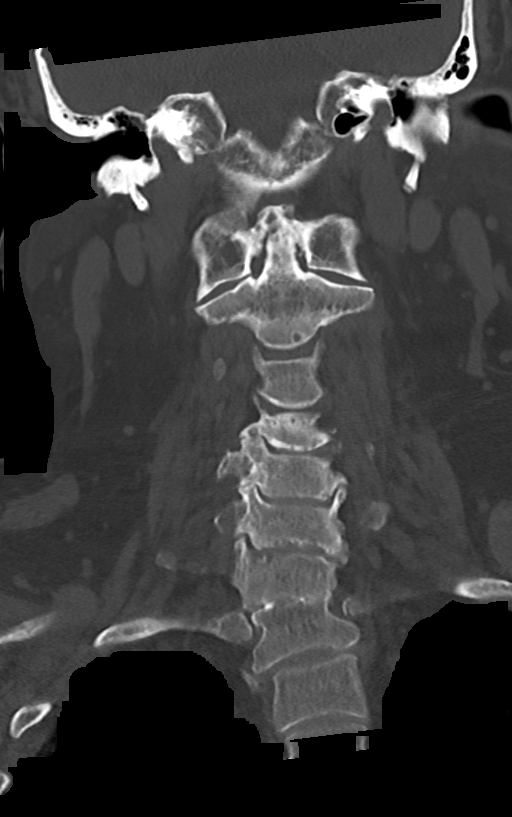
[im 38/64  bone]
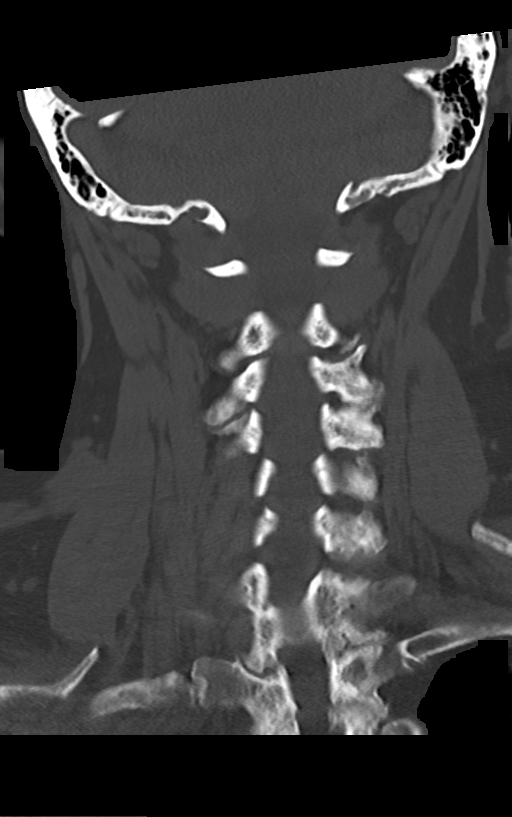

[11 of 33 positions shown; findings below may reference images not displayed]

FINDINGS: Alignment: No subluxation

Skull base and vertebrae: No acute fracture. No primary bone lesion
or focal pathologic process.

Soft tissues and spinal canal: No prevertebral fluid or swelling. No
visible canal hematoma.

Disc levels: Diffuse degenerative disc disease and facet disease.
Partial fusion at C7-T1, likely congenital.

Upper chest: Ground-glass airspace opacities in the upper lobes.
This has improved since prior CT angio chest 01/30/2021.

Other: None
IMPRESSION: Degenerative disc and facet disease.  No acute bony abnormality.

## 2022-06-18 IMAGING — CR DG KNEE COMPLETE 4+V*R*
4 series · 4 of 4 positions shown · non-contrast
Comparison: None.

CLINICAL DATA: Fall, bilateral knee pain

EXAM:
RIGHT KNEE - COMPLETE 4+ VIEW

[knee ap]
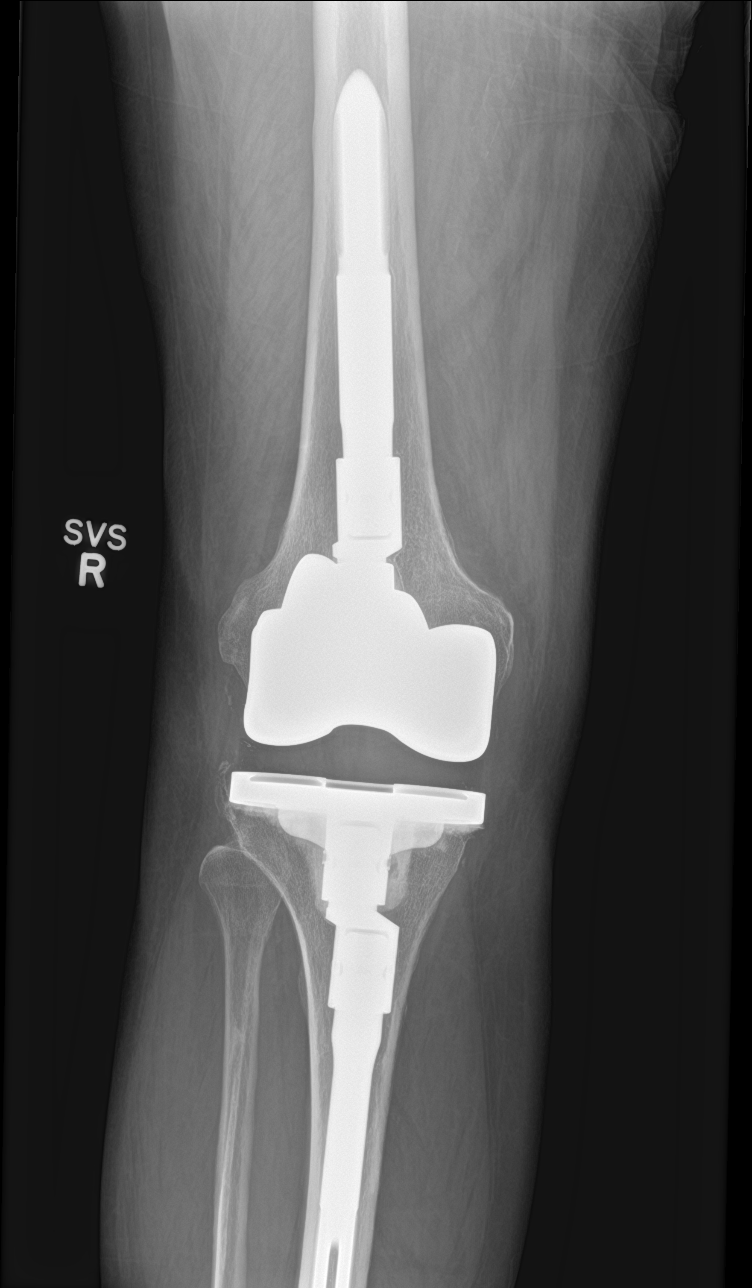

[knee obl (1 of 2)]
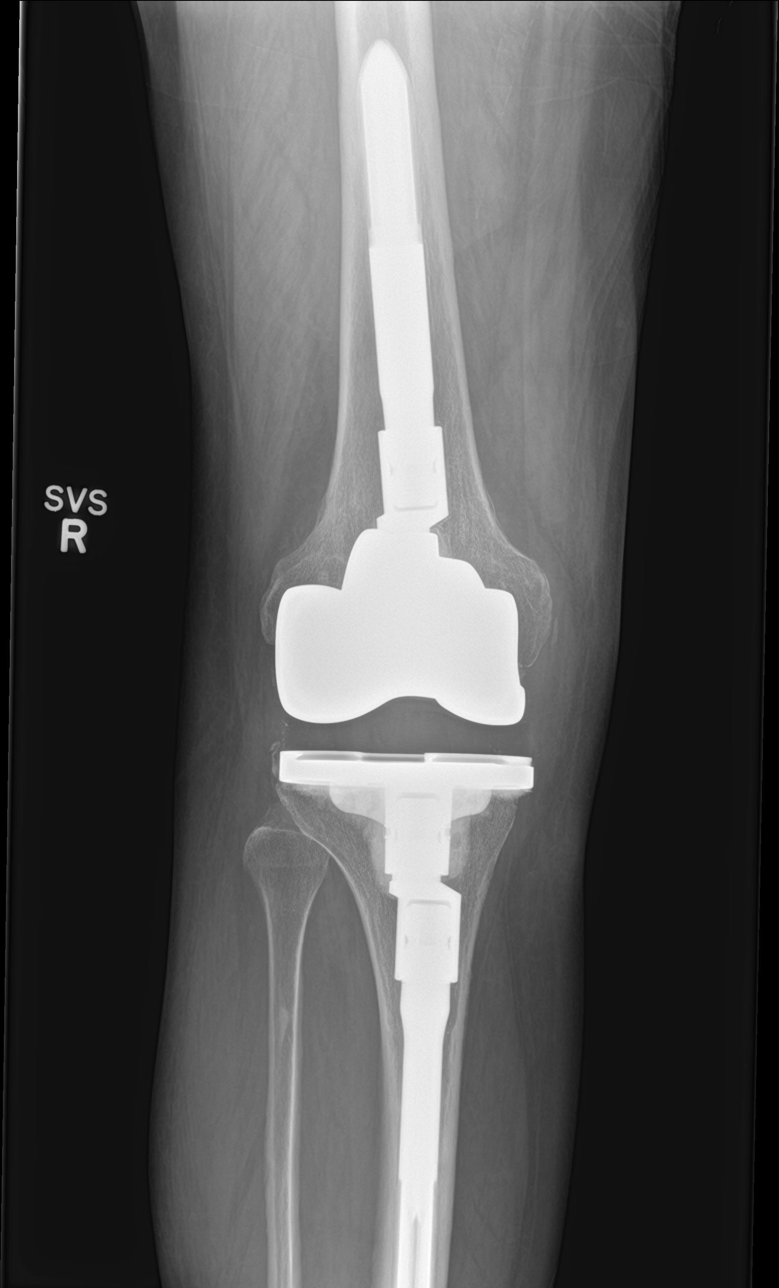

[knee obl (2 of 2)]
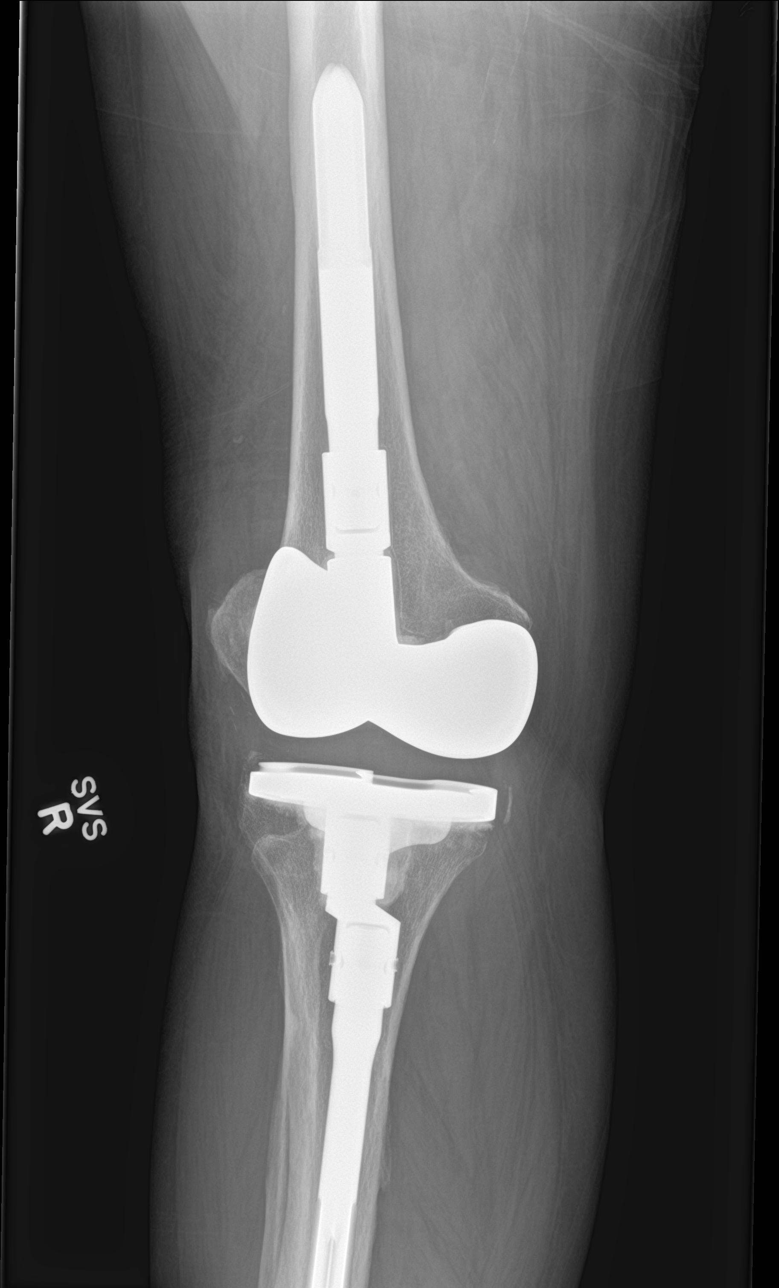

[knee lat]
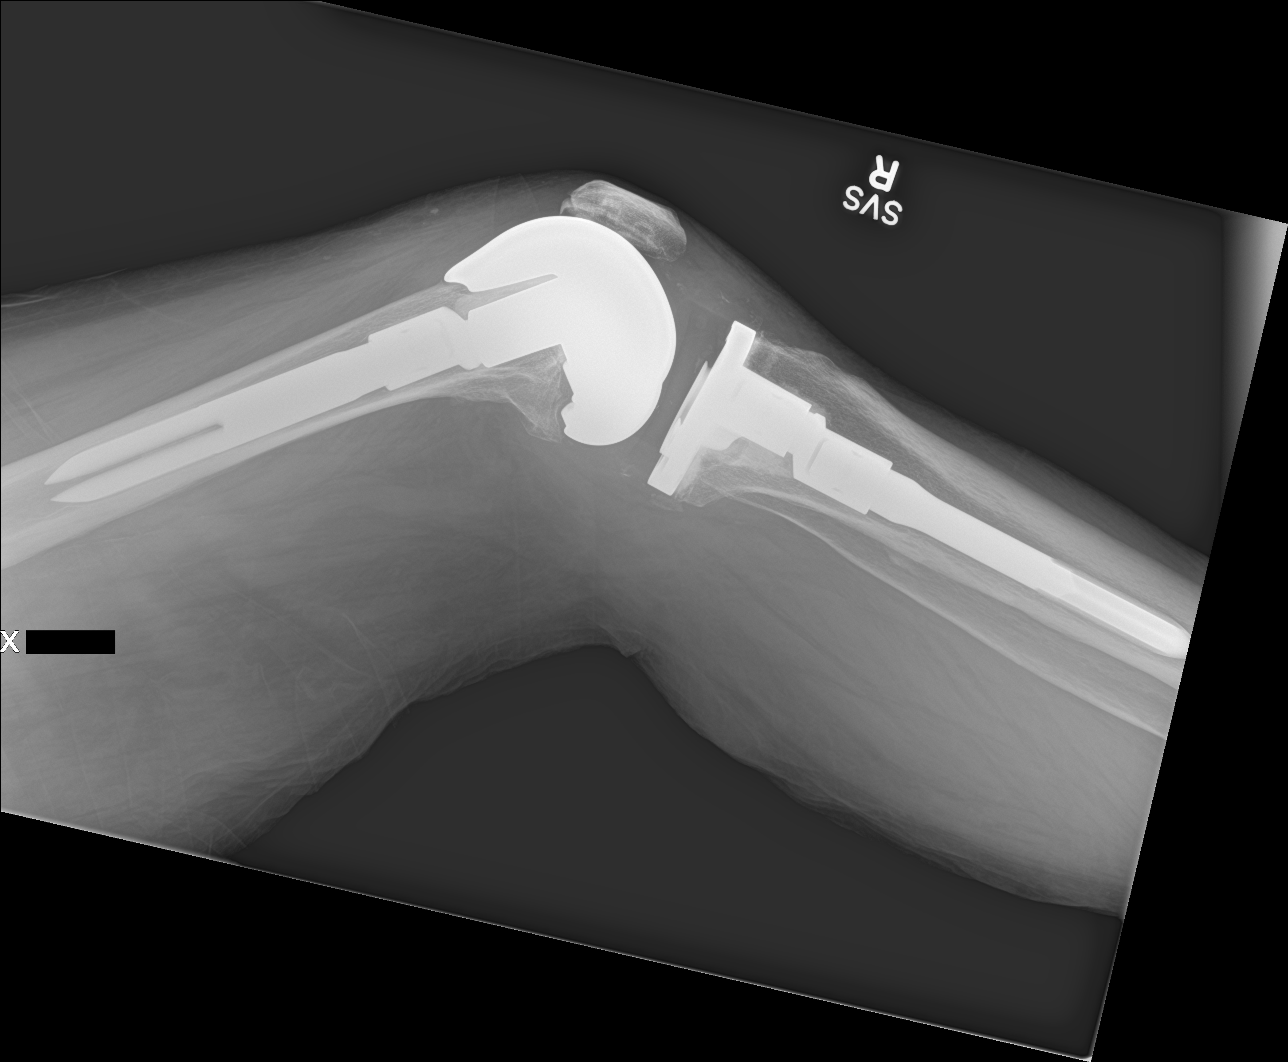

[4 of 4 positions shown; findings below may reference images not displayed]

FINDINGS: Changes of right knee replacement. No hardware complicating feature.
No acute bony abnormality. Specifically, no fracture, subluxation,
or dislocation. No joint effusion.
IMPRESSION: Prior right knee replacement.  No acute bony abnormality.

## 2022-06-18 IMAGING — CR DG HIP (WITH OR WITHOUT PELVIS) 2-3V*R*
3 series · 3 of 3 positions shown · non-contrast
Comparison: X-ray right knee 07/21/2021

CLINICAL DATA: fall, R hip pain with ecchymosis, bilateral knee
pain

EXAM:
DG HIP (WITH OR WITHOUT PELVIS) 2-3V RIGHT

[pelvis ap]
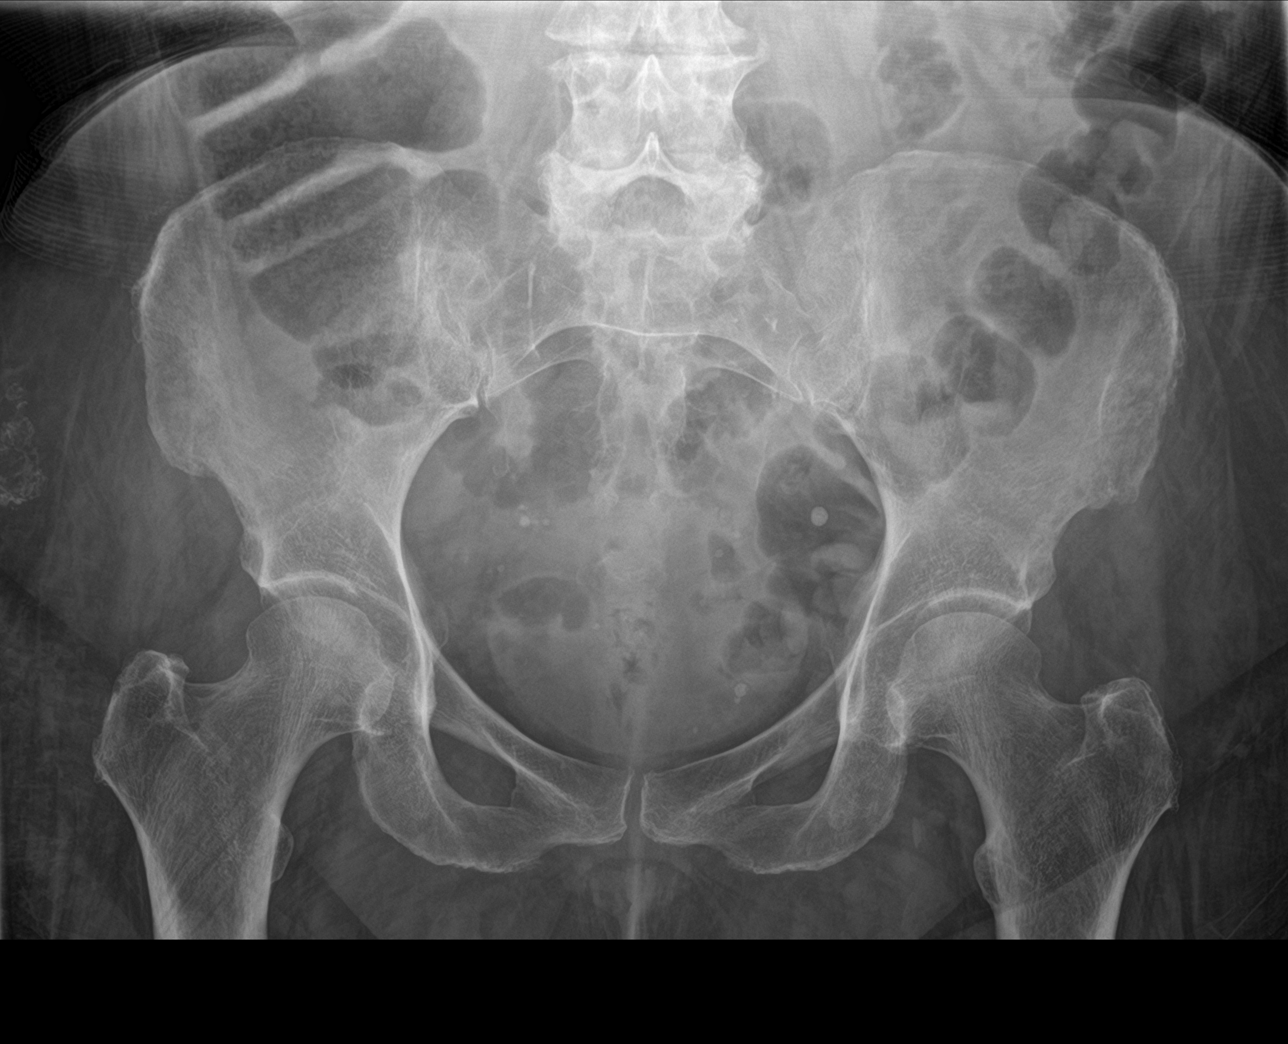

[hip ap]
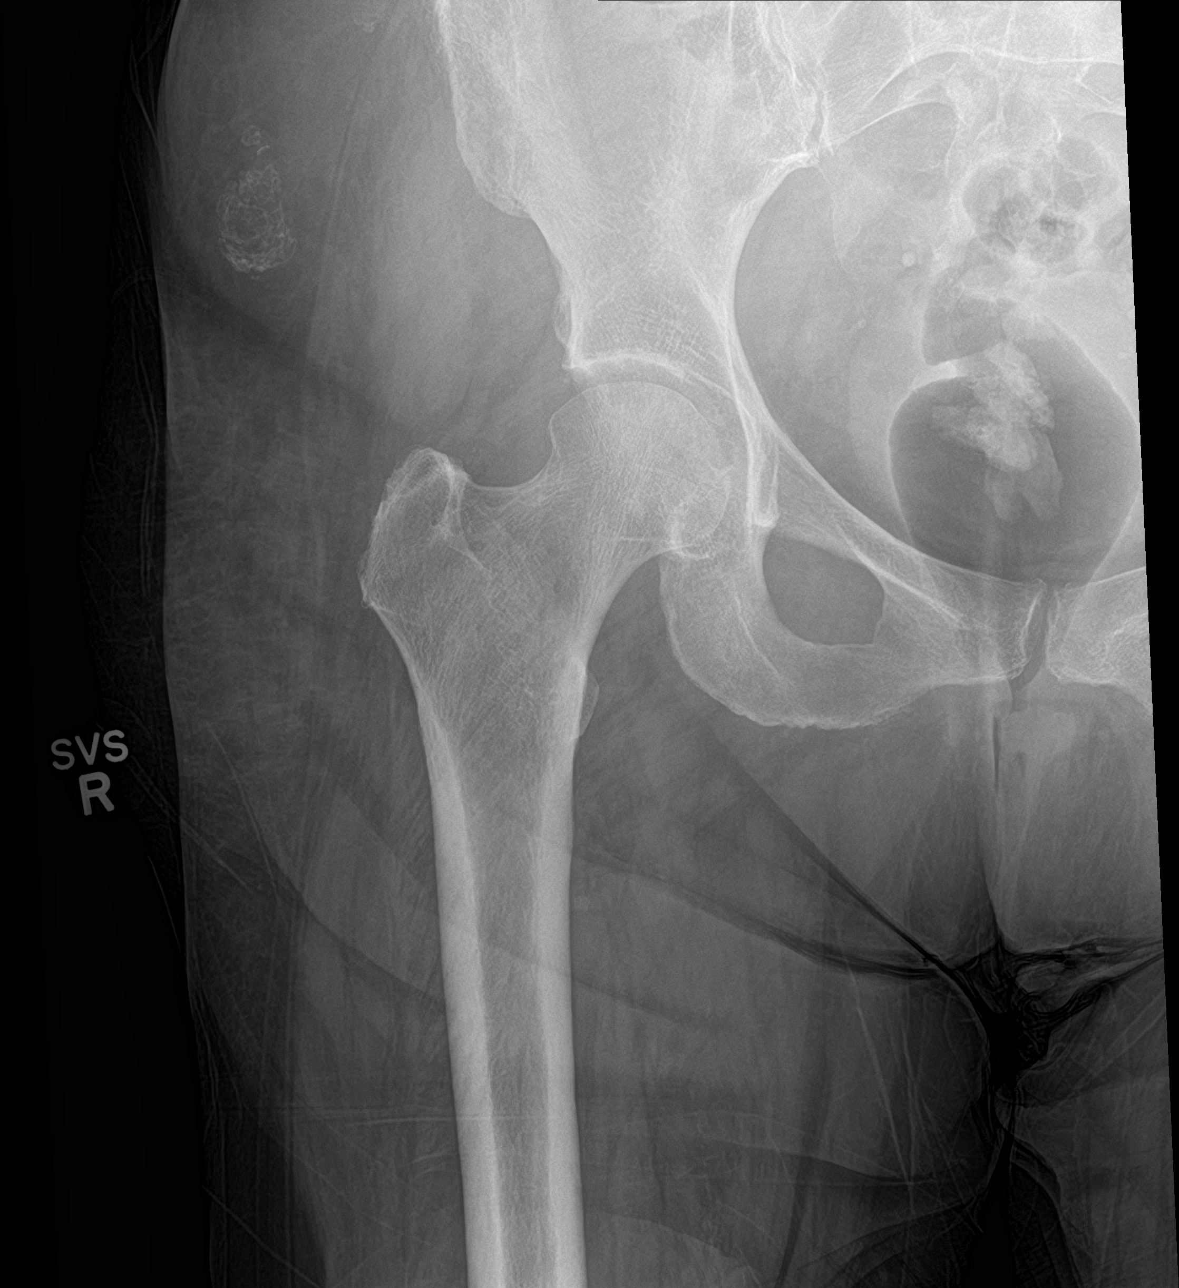

[hip lat]
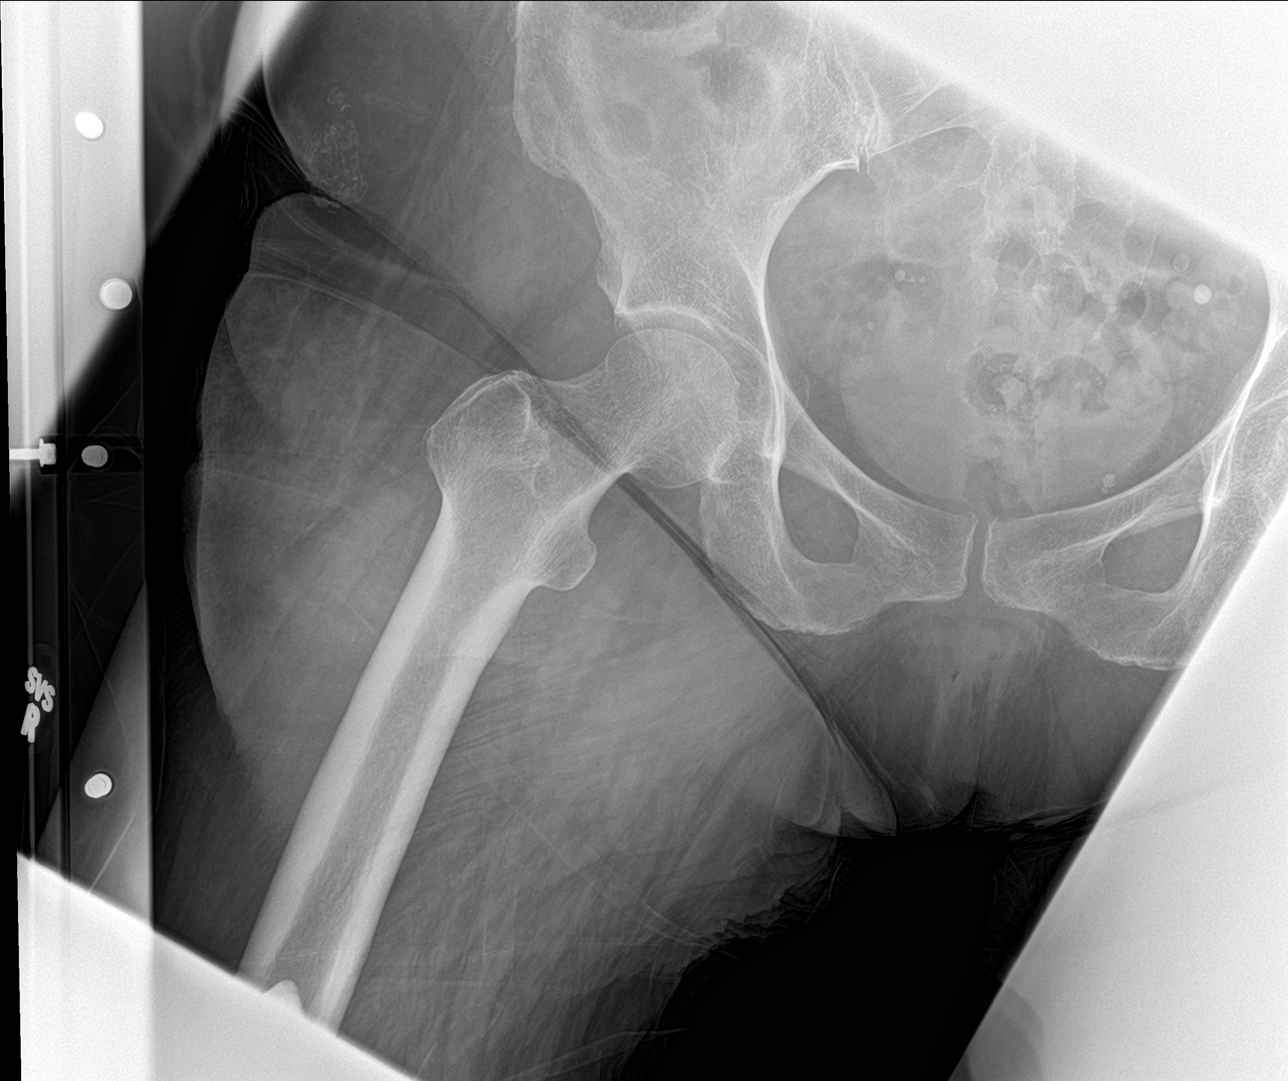

[3 of 3 positions shown; findings below may reference images not displayed]

FINDINGS: There is no evidence of hip fracture or dislocation of the right
hip. Frontal view of left hip grossly unremarkable. No acute
displaced fracture or diastasis of the bones. Degenerative changes
of the visualized lower lumbar spine. There is no evidence of
arthropathy or other focal bone abnormality.

Tip of knee prosthesis noted along the mid femoral shaft.
IMPRESSION: 1. No acute displaced fracture or dislocation of the right hip.
2. No acute displaced fracture or diastasis of the bones of the
pelvis.
